# Patient Record
Sex: Male | Born: 1970 | Race: Black or African American | Hispanic: No | Marital: Single | Smoking: Never smoker
Health system: Southern US, Community
[De-identification: ages and names within clinical notes are randomized; demographics above are authoritative.]

## PROBLEM LIST (undated history)

## (undated) DIAGNOSIS — R32 Unspecified urinary incontinence: Secondary | ICD-10-CM

## (undated) DIAGNOSIS — F3181 Bipolar II disorder: Secondary | ICD-10-CM

## (undated) DIAGNOSIS — Z87442 Personal history of urinary calculi: Secondary | ICD-10-CM

## (undated) DIAGNOSIS — K921 Melena: Secondary | ICD-10-CM

## (undated) DIAGNOSIS — E785 Hyperlipidemia, unspecified: Secondary | ICD-10-CM

## (undated) DIAGNOSIS — I10 Essential (primary) hypertension: Secondary | ICD-10-CM

## (undated) DIAGNOSIS — D649 Anemia, unspecified: Secondary | ICD-10-CM

## (undated) DIAGNOSIS — F32A Depression, unspecified: Secondary | ICD-10-CM

## (undated) DIAGNOSIS — F419 Anxiety disorder, unspecified: Secondary | ICD-10-CM

## (undated) DIAGNOSIS — N319 Neuromuscular dysfunction of bladder, unspecified: Secondary | ICD-10-CM

## (undated) DIAGNOSIS — Z87828 Personal history of other (healed) physical injury and trauma: Secondary | ICD-10-CM

## (undated) DIAGNOSIS — G839 Paralytic syndrome, unspecified: Secondary | ICD-10-CM

## (undated) DIAGNOSIS — N189 Chronic kidney disease, unspecified: Secondary | ICD-10-CM

## (undated) DIAGNOSIS — F329 Major depressive disorder, single episode, unspecified: Secondary | ICD-10-CM

## (undated) HISTORY — DX: Anxiety disorder, unspecified: F41.9

## (undated) HISTORY — PX: WISDOM TOOTH EXTRACTION: SHX21

## (undated) HISTORY — DX: Melena: K92.1

## (undated) HISTORY — DX: Essential (primary) hypertension: I10

## (undated) HISTORY — DX: Personal history of other (healed) physical injury and trauma: Z87.828

## (undated) HISTORY — DX: Bipolar II disorder: F31.81

## (undated) HISTORY — DX: Major depressive disorder, single episode, unspecified: F32.9

## (undated) HISTORY — DX: Unspecified urinary incontinence: R32

## (undated) HISTORY — DX: Depression, unspecified: F32.A

## (undated) HISTORY — DX: Hyperlipidemia, unspecified: E78.5

## (undated) HISTORY — PX: OTHER SURGICAL HISTORY: SHX169

---

## 2001-12-19 HISTORY — PX: ABDOMINAL HERNIA REPAIR: SHX539

## 2006-09-16 ENCOUNTER — Inpatient Hospital Stay (HOSPITAL_COMMUNITY): Admission: EM | Admit: 2006-09-16 | Discharge: 2006-09-19 | Payer: Self-pay | Admitting: Emergency Medicine

## 2007-06-08 ENCOUNTER — Emergency Department (HOSPITAL_COMMUNITY): Admission: EM | Admit: 2007-06-08 | Discharge: 2007-06-08 | Payer: Self-pay | Admitting: Emergency Medicine

## 2010-10-08 ENCOUNTER — Inpatient Hospital Stay (HOSPITAL_COMMUNITY): Admission: EM | Admit: 2010-10-08 | Discharge: 2010-10-13 | Payer: Self-pay | Admitting: Emergency Medicine

## 2010-11-10 ENCOUNTER — Ambulatory Visit: Payer: Self-pay | Admitting: Psychiatry

## 2011-01-01 ENCOUNTER — Emergency Department (HOSPITAL_COMMUNITY)
Admission: EM | Admit: 2011-01-01 | Discharge: 2011-01-01 | Payer: Self-pay | Source: Home / Self Care | Admitting: Emergency Medicine

## 2011-01-03 LAB — POCT I-STAT, CHEM 8
Chloride: 104 mEq/L (ref 96–112)
Creatinine, Ser: 1.1 mg/dL (ref 0.4–1.5)
Glucose, Bld: 103 mg/dL — ABNORMAL HIGH (ref 70–99)
HCT: 43 % (ref 39.0–52.0)
Potassium: 3.8 mEq/L (ref 3.5–5.1)
Sodium: 140 mEq/L (ref 135–145)
TCO2: 29 mmol/L (ref 0–100)

## 2011-01-03 LAB — URINALYSIS, ROUTINE W REFLEX MICROSCOPIC
Bilirubin Urine: NEGATIVE
Ketones, ur: NEGATIVE mg/dL
Nitrite: NEGATIVE
Protein, ur: NEGATIVE mg/dL
Specific Gravity, Urine: 1.017 (ref 1.005–1.030)
Urine Glucose, Fasting: NEGATIVE mg/dL
Urobilinogen, UA: 0.2 mg/dL (ref 0.0–1.0)
pH: 6 (ref 5.0–8.0)

## 2011-01-03 LAB — URINE MICROSCOPIC-ADD ON

## 2011-01-04 ENCOUNTER — Emergency Department (HOSPITAL_COMMUNITY)
Admission: EM | Admit: 2011-01-04 | Discharge: 2011-01-05 | Disposition: A | Discharge status: Other Acute Inpatient Hospital | Payer: Self-pay | Source: Home / Self Care | Admitting: Emergency Medicine

## 2011-01-05 ENCOUNTER — Inpatient Hospital Stay (HOSPITAL_COMMUNITY)
Admission: AD | Admit: 2011-01-05 | Discharge: 2011-01-11 | Payer: Self-pay | Source: Home / Self Care | Attending: Psychiatry | Admitting: Psychiatry

## 2011-01-05 LAB — POCT I-STAT, CHEM 8
BUN: 21 mg/dL (ref 6–23)
Calcium, Ion: 1.24 mmol/L (ref 1.12–1.32)
Chloride: 104 mEq/L (ref 96–112)
Creatinine, Ser: 1.2 mg/dL (ref 0.4–1.5)
Glucose, Bld: 87 mg/dL (ref 70–99)
HCT: 38 % — ABNORMAL LOW (ref 39.0–52.0)
Hemoglobin: 12.9 g/dL — ABNORMAL LOW (ref 13.0–17.0)
Potassium: 4.3 mEq/L (ref 3.5–5.1)
Sodium: 139 mEq/L (ref 135–145)
TCO2: 28 mmol/L (ref 0–100)

## 2011-01-05 LAB — URINE CULTURE
Colony Count: >=100000
Culture  Setup Time: 201201141123

## 2011-01-05 LAB — RAPID URINE DRUG SCREEN, HOSP PERFORMED
Amphetamines: NOT DETECTED
Barbiturates: NOT DETECTED
Benzodiazepines: NOT DETECTED
Cocaine: NOT DETECTED
Opiates: NOT DETECTED
Tetrahydrocannabinol: NOT DETECTED

## 2011-01-05 LAB — ETHANOL: Alcohol, Ethyl (B): <5 mg/dL (ref 0–10)
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
## 2011-01-13 NOTE — Discharge Summary (Addendum)
  NAME:  SAXON, BARICH NO.:  [id number]  MEDICAL RECORD NO.:  [id number]          PATIENT TYPE:  IPS  LOCATION:  0500                          [facility]ILITY[facility]  [facility]  PHYSICIAN:  Eulogio Ditch, MD DATE OF BIRTH:  09-26-1971  DATE OF ADMISSION:  01/05/2011 DATE OF DISCHARGE:  01/11/2011                              DISCHARGE SUMMARY   REASON FOR ADMISSION:  Feeling depressed and suicidal.  HISTORY OF PRESENT ILLNESS:  A 40 year old African American male who was admitted as he was feeling depressed because he ran out of the Abilify. He was not taking Abilify because of his insurance issues and that is why he started feeling depressed and suicidal.  PAST PSYCH HISTORY:  The patient has a history of schizoaffective disorder.  He has a history of trying to kill himself twice in the past.  SUBSTANCE ABUSE:  The patient has no history of drug abuse of alcohol abuse.  PAST MEDICAL HISTORY:  History of hypertension, incontinence, hypercholesteremia.  ALLERGIES:  NO KNOWN DRUG ALLERGIES.  PHYSICAL EXAMINATION:  Within normal limits.  LABS:  Within normal limits.  SOCIAL HISTORY:  The patient lives in a retirement home.  He is single. He is on disability.  DISCHARGE DIAGNOSES:  Schizoaffective disorder, depressed type.  HOSPITAL COURSE:  The patient was started on Abilify 5 mg along with Zoloft 100 kg p.o. daily.  The patient responded to the medication well without any side effects.  During the hospital stay, the patient's behavior remained under control.  He participated in all the groups, always remained pleasant on approach.  On January 11, 2011 the patient told me that he is not feeling suicidal and he is feeling better on Abilify and he wants to be discharged.  I told him that we will give him a supply for 2 weeks at the time of discharge and then he can work on getting medication in the outpatient setting.  The patient agreed with treatment  plan.  DISCHARGE MEDICATIONS: 1. Abilify 5 mg at bedtime. 2. Zoloft 100 mg p.o. daily.  The patient advised to continue his regular medications and follow up with primary care physician.  FOLLOWUP APPOINTMENTS:  The patient will follow up with Dr. Milagros Evener [phone] appointment February 11, 2011 at 1:00 p.m. also with therapist, [facility] appointment January 13, 2011 at 2:00 p.m.     Eulogio Ditch, MD     SA/MEDQ  D:  01/11/2011  T:  01/11/2011  Job:  [id number]  Electronically Signed by Eulogio Ditch  on 01/13/2011 05:32:46 AM

## 2011-03-02 LAB — DIFFERENTIAL
Basophils Absolute: 0 10*3/uL (ref 0.0–0.1)
Basophils Relative: 0 % (ref 0–1)
Basophils Relative: 1 % (ref 0–1)
Eosinophils Absolute: 0.2 10*3/uL (ref 0.0–0.7)
Eosinophils Absolute: 0.2 10*3/uL (ref 0.0–0.7)
Eosinophils Relative: 1 % (ref 0–5)
Eosinophils Relative: 3 % (ref 0–5)
Lymphocytes Relative: 16 % (ref 12–46)
Lymphocytes Relative: 21 % (ref 12–46)
Lymphocytes Relative: 6 % — ABNORMAL LOW (ref 12–46)
Lymphs Abs: 1 10*3/uL (ref 0.7–4.0)
Lymphs Abs: 1.2 10*3/uL (ref 0.7–4.0)
Monocytes Absolute: 1 10*3/uL (ref 0.1–1.0)
Monocytes Relative: 6 % (ref 3–12)
Neutro Abs: 14 10*3/uL — ABNORMAL HIGH (ref 1.7–7.7)
Neutro Abs: 4.6 10*3/uL (ref 1.7–7.7)
Neutro Abs: 5.5 10*3/uL (ref 1.7–7.7)
Neutrophils Relative %: 73 % (ref 43–77)
Neutrophils Relative %: 87 % — ABNORMAL HIGH (ref 43–77)

## 2011-03-02 LAB — URINE MICROSCOPIC-ADD ON

## 2011-03-02 LAB — URINALYSIS, ROUTINE W REFLEX MICROSCOPIC
Bilirubin Urine: NEGATIVE
Glucose, UA: NEGATIVE mg/dL
Ketones, ur: 15 mg/dL — AB
Nitrite: POSITIVE — AB
Protein, ur: 100 mg/dL — AB
Specific Gravity, Urine: 1.017 (ref 1.005–1.030)
Urobilinogen, UA: 1 mg/dL (ref 0.0–1.0)
pH: 5.5 (ref 5.0–8.0)

## 2011-03-02 LAB — LIPID PANEL
Cholesterol: 165 mg/dL (ref 0–200)
HDL: 33 mg/dL — ABNORMAL LOW (ref >39)
LDL Cholesterol: 105 mg/dL — ABNORMAL HIGH (ref 0–99)
Triglycerides: 137 mg/dL (ref <150)

## 2011-03-02 LAB — CBC
HCT: 32.4 % — ABNORMAL LOW (ref 39.0–52.0)
HCT: 34.1 % — ABNORMAL LOW (ref 39.0–52.0)
HCT: 43 % (ref 39.0–52.0)
Hemoglobin: 14 g/dL (ref 13.0–17.0)
MCH: 21.5 pg — ABNORMAL LOW (ref 26.0–34.0)
MCH: 21.5 pg — ABNORMAL LOW (ref 26.0–34.0)
MCHC: 32.4 g/dL (ref 30.0–36.0)
MCHC: 32.5 g/dL (ref 30.0–36.0)
MCV: 66.2 fL — ABNORMAL LOW (ref 78.0–100.0)
MCV: 66.3 fL — ABNORMAL LOW (ref 78.0–100.0)
Platelets: 202 10*3/uL (ref 150–400)
Platelets: 253 10*3/uL (ref 150–400)
RBC: 4.89 MIL/uL (ref 4.22–5.81)
RBC: 6.49 MIL/uL — ABNORMAL HIGH (ref 4.22–5.81)
RDW: 16.9 % — ABNORMAL HIGH (ref 11.5–15.5)
RDW: 17.2 % — ABNORMAL HIGH (ref 11.5–15.5)
RDW: 17.3 % — ABNORMAL HIGH (ref 11.5–15.5)
WBC: 16.2 10*3/uL — ABNORMAL HIGH (ref 4.0–10.5)

## 2011-03-02 LAB — COMPREHENSIVE METABOLIC PANEL
ALT: 32 U/L (ref 0–53)
AST: 27 U/L (ref 0–37)
AST: 44 U/L — ABNORMAL HIGH (ref 0–37)
Albumin: 4.4 g/dL (ref 3.5–5.2)
Alkaline Phosphatase: 50 U/L (ref 39–117)
BUN: 15 mg/dL (ref 6–23)
CO2: 21 mEq/L (ref 19–32)
CO2: 25 mEq/L (ref 19–32)
Calcium: 8.6 mg/dL (ref 8.4–10.5)
Calcium: 9.6 mg/dL (ref 8.4–10.5)
Chloride: 106 mEq/L (ref 96–112)
Creatinine, Ser: 0.56 mg/dL (ref 0.4–1.5)
Creatinine, Ser: 1.29 mg/dL (ref 0.4–1.5)
GFR calc Af Amer: >60 mL/min (ref >60)
GFR calc Af Amer: >60 mL/min (ref >60)
GFR calc non Af Amer: >60 mL/min (ref >60)
GFR calc non Af Amer: >60 mL/min (ref >60)
Glucose, Bld: 115 mg/dL — ABNORMAL HIGH (ref 70–99)
Glucose, Bld: 120 mg/dL — ABNORMAL HIGH (ref 70–99)
Potassium: 2.9 mEq/L — ABNORMAL LOW (ref 3.5–5.1)
Sodium: 142 mEq/L (ref 135–145)
Total Bilirubin: 1 mg/dL (ref 0.3–1.2)
Total Protein: 7.9 g/dL (ref 6.0–8.3)

## 2011-03-02 LAB — RAPID URINE DRUG SCREEN, HOSP PERFORMED
Amphetamines: NOT DETECTED
Barbiturates: NOT DETECTED
Benzodiazepines: NOT DETECTED
Cocaine: NOT DETECTED
Opiates: NOT DETECTED
Tetrahydrocannabinol: NOT DETECTED

## 2011-03-02 LAB — BASIC METABOLIC PANEL
BUN: 3 mg/dL — ABNORMAL LOW (ref 6–23)
CO2: 25 mEq/L (ref 19–32)
Chloride: 114 mEq/L — ABNORMAL HIGH (ref 96–112)
Creatinine, Ser: 0.6 mg/dL (ref 0.4–1.5)

## 2011-03-02 LAB — MAGNESIUM: Magnesium: 2.2 mg/dL (ref 1.5–2.5)

## 2011-03-02 LAB — TROPONIN I: Troponin I: 0.07 ng/mL — ABNORMAL HIGH (ref 0.00–0.06)

## 2011-03-02 LAB — SALICYLATE LEVEL: Salicylate Lvl: <4 mg/dL (ref 2.8–20.0)

## 2011-03-02 LAB — URINE CULTURE
Colony Count: 85000
Culture  Setup Time: 201110220316

## 2011-03-02 LAB — CARDIAC PANEL(CRET KIN+CKTOT+MB+TROPI)
Relative Index: 0.4 (ref 0.0–2.5)
Relative Index: 0.4 (ref 0.0–2.5)
Troponin I: 0.04 ng/mL (ref 0.00–0.06)
Troponin I: 0.07 ng/mL — ABNORMAL HIGH (ref 0.00–0.06)

## 2011-03-02 LAB — ACETAMINOPHEN LEVEL: Acetaminophen (Tylenol), Serum: <10 ug/mL — ABNORMAL LOW (ref 10–30)

## 2011-03-02 LAB — AMMONIA: Ammonia: 42 umol/L — ABNORMAL HIGH (ref 11–35)

## 2011-03-02 LAB — ETHANOL: Alcohol, Ethyl (B): <5 mg/dL (ref 0–10)

## 2011-03-02 LAB — TSH: TSH: 1.765 u[IU]/mL (ref 0.350–4.500)

## 2011-03-02 LAB — CK TOTAL AND CKMB (NOT AT ARMC)
CK, MB: 7.1 ng/mL (ref 0.3–4.0)
Relative Index: 0.4 (ref 0.0–2.5)
Total CK: 1826 U/L — ABNORMAL HIGH (ref 7–232)
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
## 2011-05-06 NOTE — Discharge Summary (Signed)
NAME:  Andre Clements, Andre Clements                 ACCOUNT NO.:  [id number]   MEDICAL RECORD NO.:  [id number]          PATIENT TYPE:  INP   LOCATION:  1611                         FACILITY:  [facility]   PHYSICIAN:  Melissa L. Ladona Ridgel, MD  DATE OF BIRTH:  [birth year]   DATE OF ADMISSION:  09/16/2006  DATE OF DISCHARGE:                                 DISCHARGE SUMMARY   CHIEF COMPLAINT ON ADMISSION:  Homelessness, paraplegia, and constipation.   DISCHARGING DIAGNOSES:  1. Constipation.  The patient has been placed on MiraLax but will be      provided with prescriptions for Colace and Dulcolax to assist with his      bowel regimen.  2. Homelessness.  The patient was evidently released from a mental      hospital facility in [place] prior to arriving in [place].  The      patient's care worker had made arrangements for him to live with his      uncle, but, unfortunately, uncle is incapable of assisting the patient      and does not have a home that is handicapped accessible.  The patient,      therefore, is effectively homeless and needs assistance with his daily      events.  Care Management will attempt to find a placement for him.  3. Gastroesophageal reflux disease.  He will remain on his proton-pump      inhibitor.  4. Hypertension.  He will remain on his lisinopril.  5. Depression.  He will remain on his Abilify.   HOSPITAL COURSE:  The patient was seen and evaluated and admitted to the  general medical floor to await social work care management placement.  Baseline laboratories revealed no abnormalities.  His urine is pending but,  at this time, does not appear to have an urinary tract infection.  The  patient is feeling upbeat and well.  He has acquired his belongings from his  uncle and awaits placement for further care as an outpatient.  At this time,  I see no psychiatric need for this patient.  He has been appropriate and is  not at risk for any behavioral issues.   On the day of  discharge, the patient's vital signs have been stable.  He is  afebrile.  Temperature 97.4.  Blood pressure 132/88.  Pulse is 60.  Respirations 20.  Saturations 99%.  GENERAL:  This is a moderately obese, African-American male, confined to a  wheelchair secondary to paraplegia.  He is in no acute distress.  He is normocephalic, atraumatic.  Pupils equal round and reactive to light.  Extraocular muscles are intact.  Mucous membranes are moist.  NECK:  Supple.  There is no JVD.  No lymph nodes.  No carotid bruits.  CHEST:  Clear to auscultation.  There are no rhonchi, rubs, or wheezes.  CARDIOVASCULAR:  Regular rate rhythm.  Positive S1, S2.  No S3, S4.  No  murmurs, rubs, or gallops.  ABDOMEN:  Soft, nontender, nondistended with positive bowel sounds.  EXTREMITIES:  Shows muscle wasting but no edema.  Neurologically, the patient is a paraplegic.  He has some mobility to lift  from the hip but, otherwise, cannot stand.  His cranial nerves II through  XII are intact.  He did not express any suicidal or homicidal ideations.   Discharging laboratory values reveal a white count of 9.0, hemoglobin 12.2,  hematocrit 30.0, platelets of 291.  His sodium is 142, potassium 3.8,  chloride 106,  CO2 is 29.  BUN is 12,  creatinine 0.8. Glucose is 108.   MEDICATIONS:  At the time of discharge will be:  1. Prilosec 20 mg daily.  2. Lisinopril 10 mg daily.  3. Abilify 30 mg daily.  4. Multivitamin.  5. Prescriptions for Colace 100 mg p.o. b.i.d. and MiraLax 17 g in 8      ounces daily as well as Dulcolax as needed.      Melissa L. Ladona Ridgel, MD  Electronically Signed     MLT/MEDQ  D:  09/18/2006  T:  09/18/2006  Job:  [id number]

## 2011-07-22 ENCOUNTER — Emergency Department (HOSPITAL_COMMUNITY)
Admission: EM | Admit: 2011-07-22 | Discharge: 2011-07-22 | Disposition: A | Discharge status: Home / Self Care | Payer: Medicare Other | Attending: Emergency Medicine | Admitting: Emergency Medicine

## 2011-07-22 DIAGNOSIS — I10 Essential (primary) hypertension: Secondary | ICD-10-CM | POA: Insufficient documentation

## 2011-07-22 DIAGNOSIS — Y9289 Other specified places as the place of occurrence of the external cause: Secondary | ICD-10-CM | POA: Insufficient documentation

## 2011-07-22 DIAGNOSIS — W268XXA Contact with other sharp object(s), not elsewhere classified, initial encounter: Secondary | ICD-10-CM | POA: Insufficient documentation

## 2011-07-22 DIAGNOSIS — E7800 Pure hypercholesterolemia, unspecified: Secondary | ICD-10-CM | POA: Insufficient documentation

## 2011-07-22 DIAGNOSIS — G8220 Paraplegia, unspecified: Secondary | ICD-10-CM | POA: Insufficient documentation

## 2011-07-22 DIAGNOSIS — K219 Gastro-esophageal reflux disease without esophagitis: Secondary | ICD-10-CM | POA: Insufficient documentation

## 2011-07-22 DIAGNOSIS — S61209A Unspecified open wound of unspecified finger without damage to nail, initial encounter: Secondary | ICD-10-CM | POA: Insufficient documentation

## 2011-11-17 IMAGING — CT CT HEAD W/O CM
1 series · 16 of 30 positions shown, 20 images · non-contrast
Comparison: None.

CLINICAL DATA: Altered level of consciousness.  Confusion.

CT HEAD WITHOUT CONTRAST
TECHNIQUE: Contiguous axial images were obtained from the base of
the skull through the vertex without contrast.

[Series 2: head_seq 4.5 h37s st · axial · 0.43mm/px · z∈[+1251,+1377]mm · 16 of 32 slices shown, 20 images]
[im 2/32  brain]
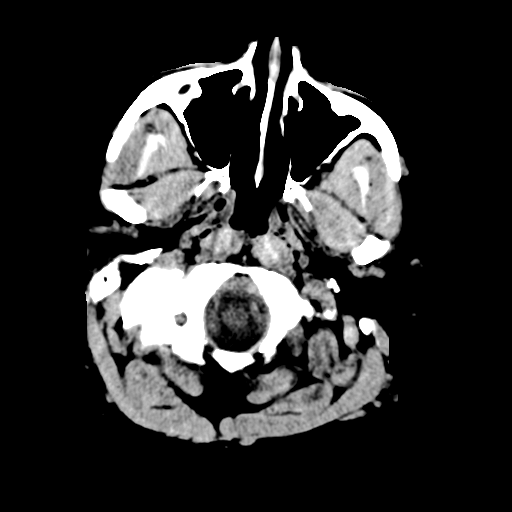
[im 2/32  bone]
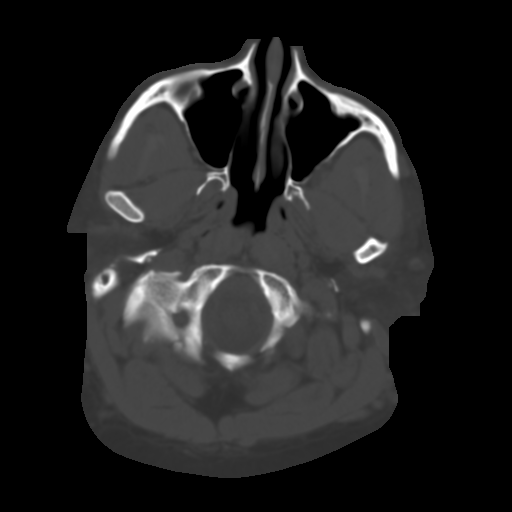
[im 4/32  brain]
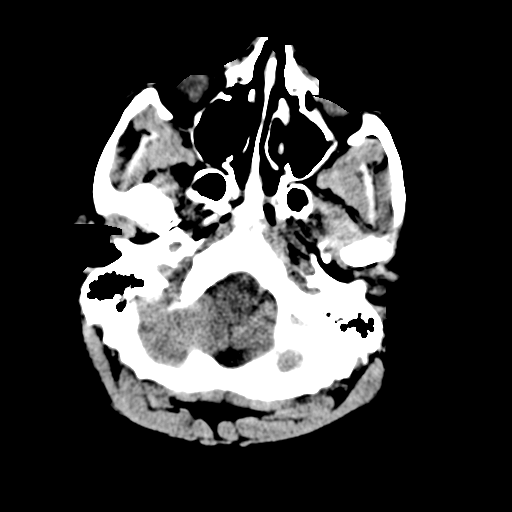
[im 6/32  brain]
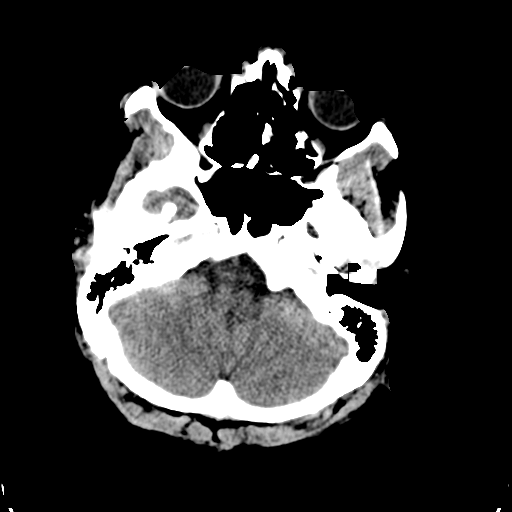
[im 8/32  brain]
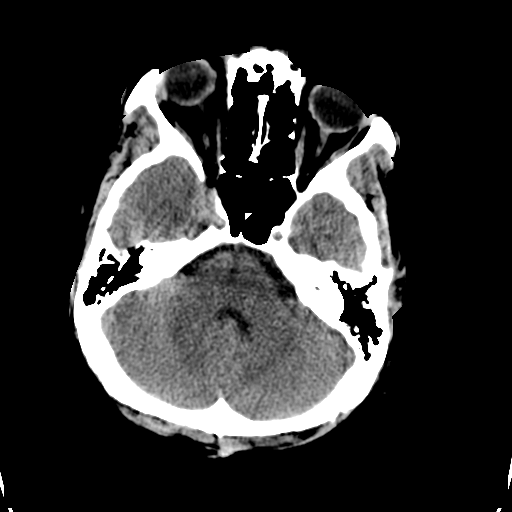
[im 9/32  brain]
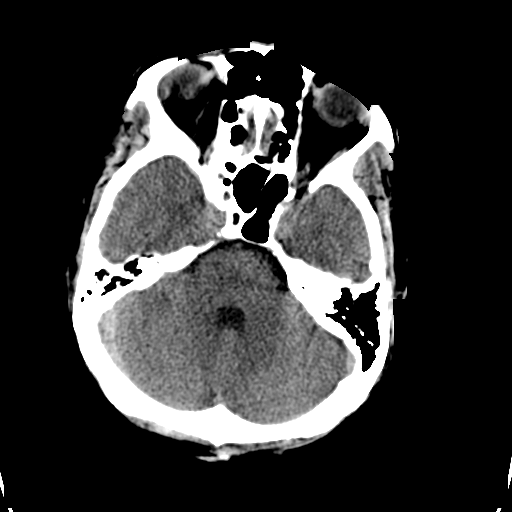
[im 9/32  bone]
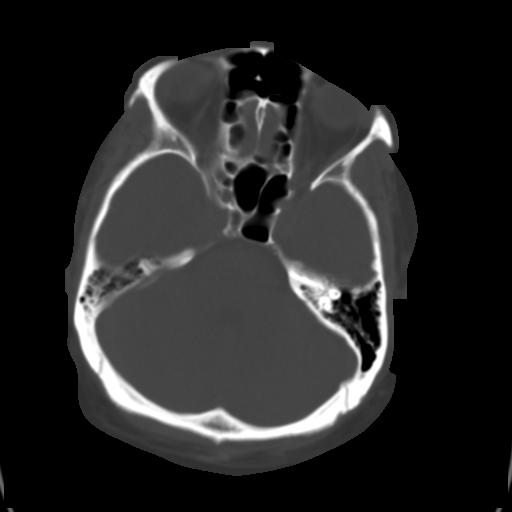
[im 11/32  brain]
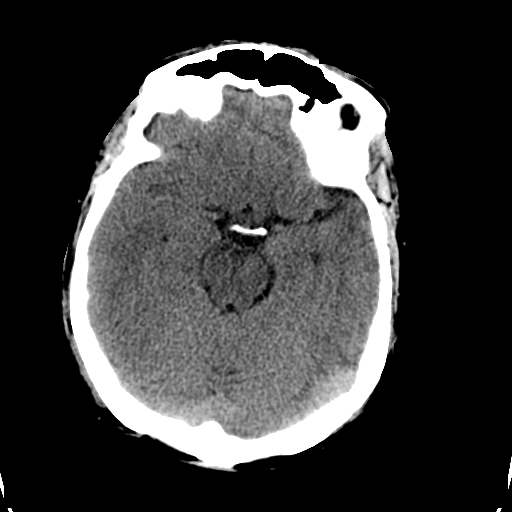
[im 13/32  brain]
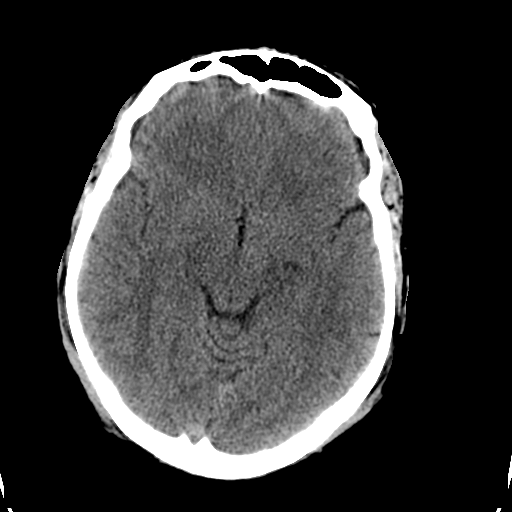
[im 15/32  brain]
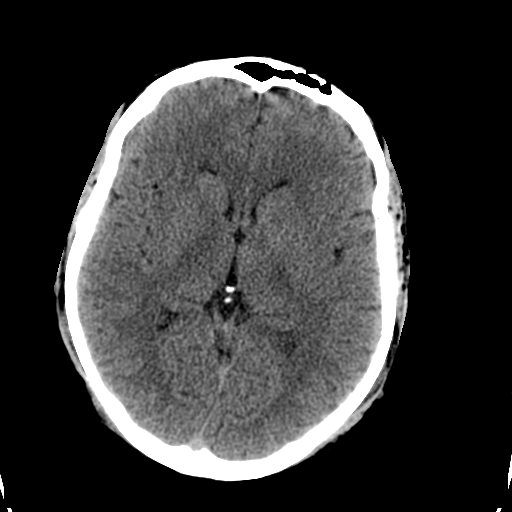
[im 17/32  brain]
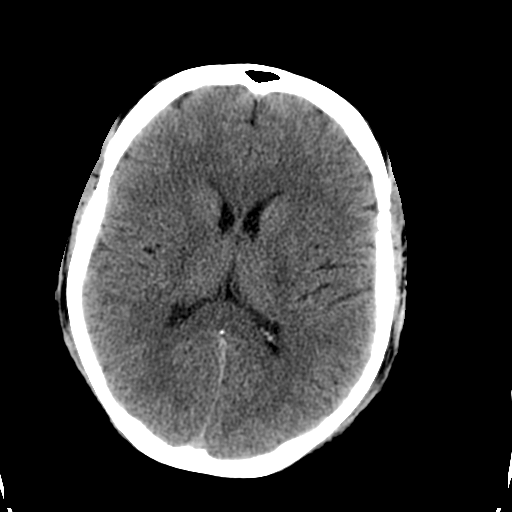
[im 17/32  bone]
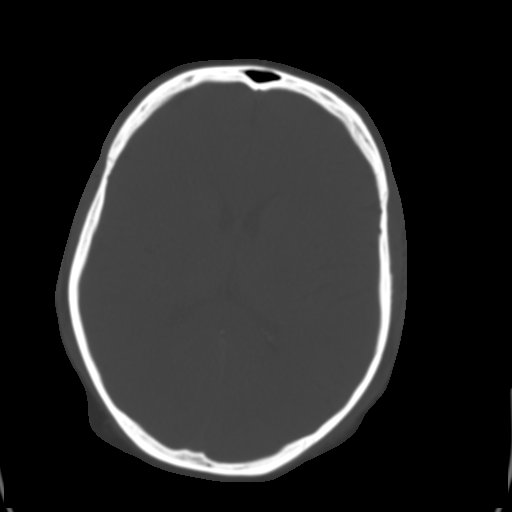
[im 19/32  brain]
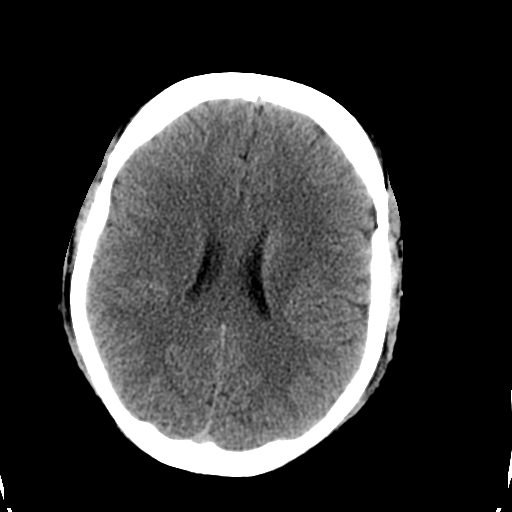
[im 21/32  brain]
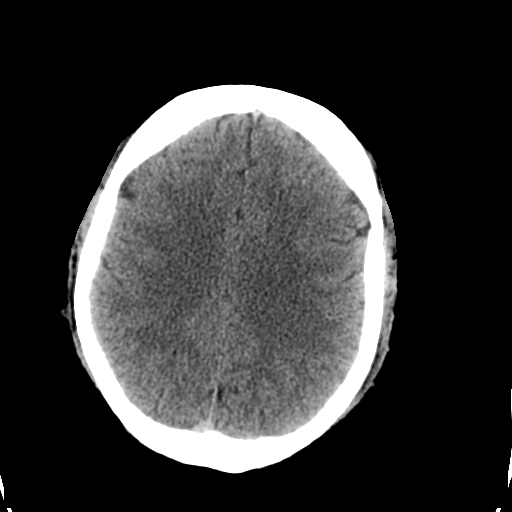
[im 23/32  brain]
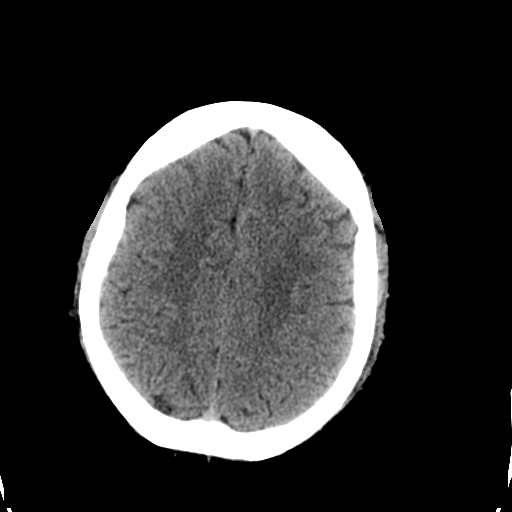
[im 24/32  brain]
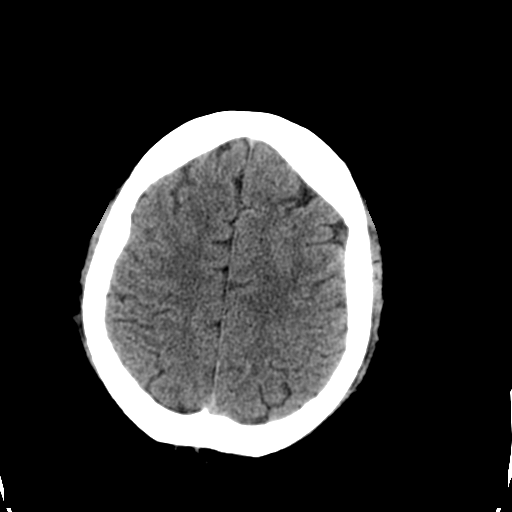
[im 24/32  bone]
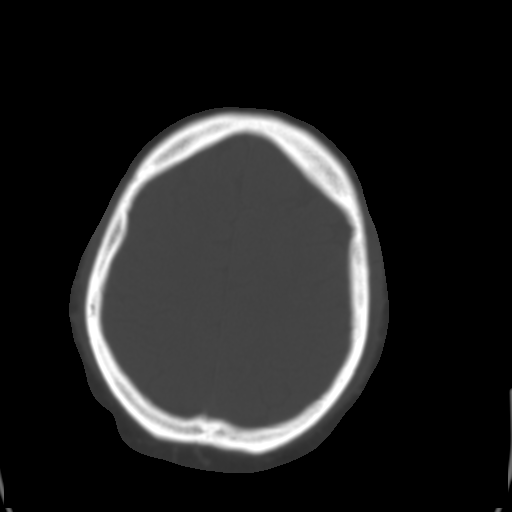
[im 26/32  brain]
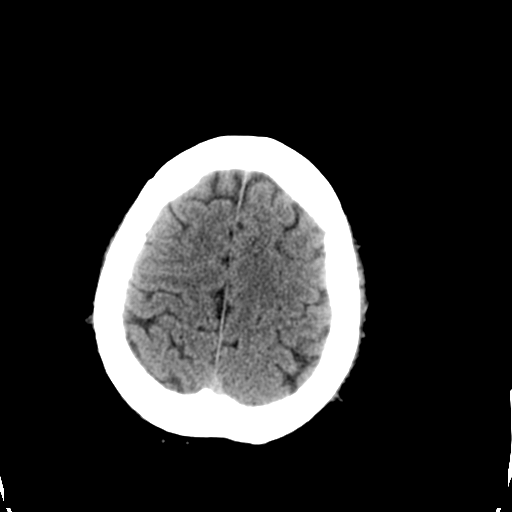
[im 28/32  brain]
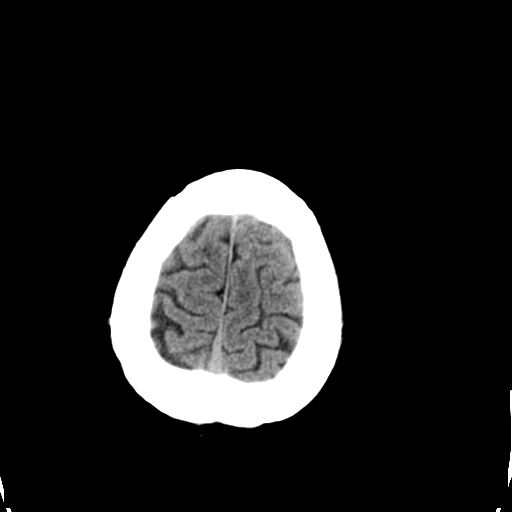
[im 30/32  brain]
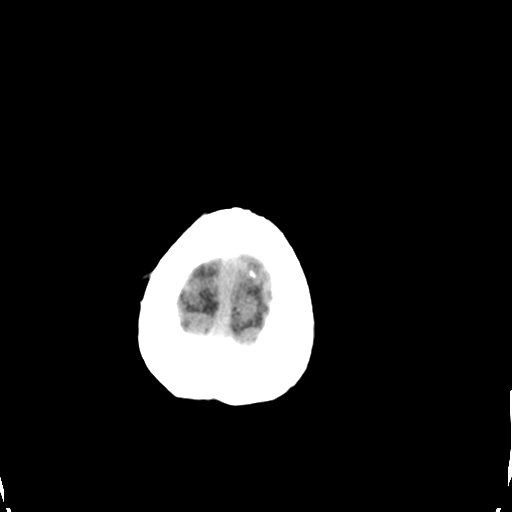

[16 of 30 positions shown; findings below may reference images not displayed]

FINDINGS: There is no acute infarction, hemorrhage, or intracranial
mass lesion.  The brain parenchyma is normal.  Osseous structures
are normal.
IMPRESSION: Normal CT scan of the head without contrast.

## 2013-05-28 ENCOUNTER — Ambulatory Visit: Payer: Medicare Other | Admitting: Physical Therapy

## 2013-06-27 ENCOUNTER — Ambulatory Visit: Payer: Medicare Other | Attending: Internal Medicine | Admitting: Physical Therapy

## 2013-06-27 DIAGNOSIS — IMO0001 Reserved for inherently not codable concepts without codable children: Secondary | ICD-10-CM | POA: Insufficient documentation

## 2013-06-27 DIAGNOSIS — M6281 Muscle weakness (generalized): Secondary | ICD-10-CM | POA: Insufficient documentation

## 2013-06-27 DIAGNOSIS — G8220 Paraplegia, unspecified: Secondary | ICD-10-CM | POA: Insufficient documentation

## 2014-12-24 DIAGNOSIS — I10 Essential (primary) hypertension: Secondary | ICD-10-CM | POA: Diagnosis not present

## 2014-12-24 DIAGNOSIS — F3130 Bipolar disorder, current episode depressed, mild or moderate severity, unspecified: Secondary | ICD-10-CM | POA: Diagnosis not present

## 2014-12-24 DIAGNOSIS — Z79899 Other long term (current) drug therapy: Secondary | ICD-10-CM | POA: Diagnosis not present

## 2014-12-24 DIAGNOSIS — E261 Secondary hyperaldosteronism: Secondary | ICD-10-CM | POA: Diagnosis not present

## 2014-12-24 DIAGNOSIS — E291 Testicular hypofunction: Secondary | ICD-10-CM | POA: Diagnosis not present

## 2014-12-24 DIAGNOSIS — D509 Iron deficiency anemia, unspecified: Secondary | ICD-10-CM | POA: Diagnosis not present

## 2014-12-31 DIAGNOSIS — R32 Unspecified urinary incontinence: Secondary | ICD-10-CM | POA: Diagnosis not present

## 2015-01-01 DIAGNOSIS — R32 Unspecified urinary incontinence: Secondary | ICD-10-CM | POA: Diagnosis not present

## 2015-01-02 DIAGNOSIS — R32 Unspecified urinary incontinence: Secondary | ICD-10-CM | POA: Diagnosis not present

## 2015-01-26 DIAGNOSIS — R32 Unspecified urinary incontinence: Secondary | ICD-10-CM | POA: Diagnosis not present

## 2015-03-05 DIAGNOSIS — R32 Unspecified urinary incontinence: Secondary | ICD-10-CM | POA: Diagnosis not present

## 2015-03-20 DIAGNOSIS — R32 Unspecified urinary incontinence: Secondary | ICD-10-CM | POA: Diagnosis not present

## 2015-03-25 DIAGNOSIS — D509 Iron deficiency anemia, unspecified: Secondary | ICD-10-CM | POA: Diagnosis not present

## 2015-03-25 DIAGNOSIS — F3130 Bipolar disorder, current episode depressed, mild or moderate severity, unspecified: Secondary | ICD-10-CM | POA: Diagnosis not present

## 2015-03-25 DIAGNOSIS — E782 Mixed hyperlipidemia: Secondary | ICD-10-CM | POA: Diagnosis not present

## 2015-03-25 DIAGNOSIS — I10 Essential (primary) hypertension: Secondary | ICD-10-CM | POA: Diagnosis not present

## 2015-03-25 DIAGNOSIS — E291 Testicular hypofunction: Secondary | ICD-10-CM | POA: Diagnosis not present

## 2015-03-25 DIAGNOSIS — Z79899 Other long term (current) drug therapy: Secondary | ICD-10-CM | POA: Diagnosis not present

## 2015-04-17 DIAGNOSIS — R32 Unspecified urinary incontinence: Secondary | ICD-10-CM | POA: Diagnosis not present

## 2015-04-21 DIAGNOSIS — R32 Unspecified urinary incontinence: Secondary | ICD-10-CM | POA: Diagnosis not present

## 2015-04-22 DIAGNOSIS — R32 Unspecified urinary incontinence: Secondary | ICD-10-CM | POA: Diagnosis not present

## 2015-05-13 DIAGNOSIS — R32 Unspecified urinary incontinence: Secondary | ICD-10-CM | POA: Diagnosis not present

## 2015-05-29 DIAGNOSIS — R32 Unspecified urinary incontinence: Secondary | ICD-10-CM | POA: Diagnosis not present

## 2015-06-09 DIAGNOSIS — R32 Unspecified urinary incontinence: Secondary | ICD-10-CM | POA: Diagnosis not present

## 2015-06-25 DIAGNOSIS — R32 Unspecified urinary incontinence: Secondary | ICD-10-CM | POA: Diagnosis not present

## 2015-07-10 DIAGNOSIS — R32 Unspecified urinary incontinence: Secondary | ICD-10-CM | POA: Diagnosis not present

## 2015-07-27 DIAGNOSIS — R32 Unspecified urinary incontinence: Secondary | ICD-10-CM | POA: Diagnosis not present

## 2015-08-18 DIAGNOSIS — R32 Unspecified urinary incontinence: Secondary | ICD-10-CM | POA: Diagnosis not present

## 2015-08-20 ENCOUNTER — Ambulatory Visit: Payer: Self-pay | Admitting: Internal Medicine

## 2015-09-03 DIAGNOSIS — R32 Unspecified urinary incontinence: Secondary | ICD-10-CM | POA: Diagnosis not present

## 2015-09-14 ENCOUNTER — Other Ambulatory Visit (INDEPENDENT_AMBULATORY_CARE_PROVIDER_SITE_OTHER): Payer: Medicare Other

## 2015-09-14 ENCOUNTER — Ambulatory Visit (INDEPENDENT_AMBULATORY_CARE_PROVIDER_SITE_OTHER): Payer: Medicare Other | Admitting: Internal Medicine

## 2015-09-14 ENCOUNTER — Encounter: Payer: Self-pay | Admitting: Internal Medicine

## 2015-09-14 ENCOUNTER — Encounter (INDEPENDENT_AMBULATORY_CARE_PROVIDER_SITE_OTHER): Payer: Self-pay

## 2015-09-14 VITALS — BP 160/102 | HR 93 | Temp 98.6°F | Resp 12

## 2015-09-14 DIAGNOSIS — I10 Essential (primary) hypertension: Secondary | ICD-10-CM

## 2015-09-14 DIAGNOSIS — F39 Unspecified mood [affective] disorder: Secondary | ICD-10-CM

## 2015-09-14 DIAGNOSIS — R7301 Impaired fasting glucose: Secondary | ICD-10-CM | POA: Diagnosis not present

## 2015-09-14 DIAGNOSIS — E785 Hyperlipidemia, unspecified: Secondary | ICD-10-CM

## 2015-09-14 DIAGNOSIS — Z993 Dependence on wheelchair: Secondary | ICD-10-CM

## 2015-09-14 DIAGNOSIS — R7989 Other specified abnormal findings of blood chemistry: Secondary | ICD-10-CM

## 2015-09-14 DIAGNOSIS — Z23 Encounter for immunization: Secondary | ICD-10-CM | POA: Diagnosis not present

## 2015-09-14 DIAGNOSIS — E119 Type 2 diabetes mellitus without complications: Secondary | ICD-10-CM

## 2015-09-14 LAB — COMPREHENSIVE METABOLIC PANEL
ALK PHOS: 48 U/L (ref 39–117)
ALT: 30 U/L (ref 0–53)
AST: 20 U/L (ref 0–37)
Albumin: 4.2 g/dL (ref 3.5–5.2)
BILIRUBIN TOTAL: 0.3 mg/dL (ref 0.2–1.2)
BUN: 14 mg/dL (ref 6–23)
CALCIUM: 9.4 mg/dL (ref 8.4–10.5)
CO2: 28 mEq/L (ref 19–32)
Chloride: 103 mEq/L (ref 96–112)
Creatinine, Ser: 0.61 mg/dL (ref 0.40–1.50)
GFR: 186.35 mL/min (ref >60.00)
Glucose, Bld: 108 mg/dL — ABNORMAL HIGH (ref 70–99)
POTASSIUM: 3.7 meq/L (ref 3.5–5.1)
Sodium: 139 mEq/L (ref 135–145)
TOTAL PROTEIN: 7.3 g/dL (ref 6.0–8.3)

## 2015-09-14 LAB — LDL CHOLESTEROL, DIRECT: Direct LDL: 84 mg/dL

## 2015-09-14 LAB — LIPID PANEL
CHOLESTEROL: 159 mg/dL (ref 0–200)
HDL: 36.2 mg/dL — ABNORMAL LOW (ref >39.00)
NonHDL: 122.76
TRIGLYCERIDES: 332 mg/dL — AB (ref 0.0–149.0)
Total CHOL/HDL Ratio: 4
VLDL: 66.4 mg/dL — ABNORMAL HIGH (ref 0.0–40.0)

## 2015-09-14 LAB — HEMOGLOBIN A1C: HEMOGLOBIN A1C: 5.7 % (ref 4.6–6.5)

## 2015-09-14 NOTE — Patient Instructions (Signed)
We will check the blood work today and sent in the refills if the labs are okay. We may have to adjust your medicines based on the results.   We have given you the flu shot. We will work on the scooter and depending on insurance you may need to come in for another visit. We will check.   We have gotten the referral going for the psychiatrist in your network to handle your bipolar disorder.

## 2015-09-14 NOTE — Progress Notes (Signed)
Pre visit review using our clinic review tool, if applicable. No additional management support is needed unless otherwise documented below in the visit note. 

## 2015-09-16 ENCOUNTER — Encounter: Payer: Self-pay | Admitting: Internal Medicine

## 2015-09-16 DIAGNOSIS — E785 Hyperlipidemia, unspecified: Secondary | ICD-10-CM | POA: Insufficient documentation

## 2015-09-16 DIAGNOSIS — G8220 Paraplegia, unspecified: Secondary | ICD-10-CM | POA: Insufficient documentation

## 2015-09-16 DIAGNOSIS — E119 Type 2 diabetes mellitus without complications: Secondary | ICD-10-CM | POA: Insufficient documentation

## 2015-09-16 DIAGNOSIS — F39 Unspecified mood [affective] disorder: Secondary | ICD-10-CM | POA: Insufficient documentation

## 2015-09-16 DIAGNOSIS — R7301 Impaired fasting glucose: Secondary | ICD-10-CM | POA: Diagnosis not present

## 2015-09-16 DIAGNOSIS — I10 Essential (primary) hypertension: Secondary | ICD-10-CM | POA: Insufficient documentation

## 2015-09-16 DIAGNOSIS — Z23 Encounter for immunization: Secondary | ICD-10-CM

## 2015-09-16 NOTE — Assessment & Plan Note (Signed)
Not known if he has any complications. Getting his old records and checking HgA1c for level of control today. Is on ACE-I and statin. Adjust as needed.

## 2015-09-16 NOTE — Assessment & Plan Note (Signed)
Goal LDL <100 with his diabetes. Is taking zocor 40 mg daily. Adjust as needed, checking lipid panel today.

## 2015-09-16 NOTE — Assessment & Plan Note (Signed)
Unclear what exactly this is but he thinks that it might be bipolar disorder. Is currently taking haldol and zoloft. He states that he used to be on 3-4 medications for his mood and was able to come off some. Mood stable today and he just refilled his mental health medications so will not fill and will refer to psychiatry.

## 2015-09-16 NOTE — Assessment & Plan Note (Signed)
BP is moderately high at today's visit. Will likely adjust his lisinopril 10 mg if CMP okay.
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
## 2015-09-16 NOTE — Assessment & Plan Note (Signed)
Getting records for exact diagnosis. He knows that he had some spinal cord injury after a failed suicide attempt. He is hoping to get an electric scooter so he can independently get to the grocery store. Will try to order and see what other things are required and he may require additional visit for evaluation of those criteria.

## 2015-09-28 DIAGNOSIS — R32 Unspecified urinary incontinence: Secondary | ICD-10-CM | POA: Diagnosis not present

## 2015-10-01 ENCOUNTER — Telehealth: Payer: Self-pay | Admitting: Internal Medicine

## 2015-10-01 NOTE — Telephone Encounter (Signed)
Pt called in and wants to know what is going on with his referral to phycologist?  He is able to be out of meds and is worried about running out?

## 2015-10-02 NOTE — Telephone Encounter (Signed)
Referral faxed to [facility] They will contact pt directly.

## 2015-10-23 DIAGNOSIS — R32 Unspecified urinary incontinence: Secondary | ICD-10-CM | POA: Diagnosis not present

## 2015-10-27 ENCOUNTER — Encounter (HOSPITAL_COMMUNITY): Payer: Self-pay | Admitting: Psychiatry

## 2015-10-27 ENCOUNTER — Ambulatory Visit (INDEPENDENT_AMBULATORY_CARE_PROVIDER_SITE_OTHER): Payer: Medicare Other | Admitting: Psychiatry

## 2015-10-27 VITALS — BP 123/77 | HR 85 | Ht 68.0 in

## 2015-10-27 DIAGNOSIS — F251 Schizoaffective disorder, depressive type: Secondary | ICD-10-CM

## 2015-10-27 MED ORDER — SERTRALINE HCL 100 MG PO TABS: 200.0000 mg | ORAL_TABLET | Freq: Every day | ORAL | Status: DC
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
## 2015-11-02 ENCOUNTER — Telehealth: Payer: Self-pay | Admitting: Internal Medicine

## 2015-11-02 NOTE — Telephone Encounter (Signed)
Pt called in and needs refill on all meds on his med refilled but the Hadol.    Pharmacy is [facility]

## 2015-11-03 ENCOUNTER — Other Ambulatory Visit: Payer: Self-pay | Admitting: Geriatric Medicine

## 2015-11-03 DIAGNOSIS — F251 Schizoaffective disorder, depressive type: Secondary | ICD-10-CM

## 2015-11-03 MED ORDER — LISINOPRIL 10 MG PO TABS: 10.0000 mg | ORAL_TABLET | Freq: Every day | ORAL | Status: DC

## 2015-11-03 MED ORDER — METFORMIN HCL ER 500 MG PO TB24: 500.0000 mg | ORAL_TABLET | Freq: Every day | ORAL | Status: DC

## 2015-11-03 MED ORDER — SIMVASTATIN 40 MG PO TABS: 40.0000 mg | ORAL_TABLET | Freq: Every day | ORAL | Status: DC

## 2015-11-03 MED ORDER — SERTRALINE HCL 100 MG PO TABS: 200.0000 mg | ORAL_TABLET | Freq: Every day | ORAL | Status: DC

## 2015-11-03 MED ORDER — OMEPRAZOLE 40 MG PO CPDR: 40.0000 mg | DELAYED_RELEASE_CAPSULE | Freq: Every day | ORAL | Status: DC

## 2015-11-03 NOTE — Telephone Encounter (Signed)
Sent to pharmacy 

## 2015-11-04 ENCOUNTER — Ambulatory Visit (HOSPITAL_COMMUNITY): Payer: Medicare Other | Admitting: Psychiatry

## 2015-11-19 DIAGNOSIS — R32 Unspecified urinary incontinence: Secondary | ICD-10-CM | POA: Diagnosis not present

## 2015-12-04 ENCOUNTER — Ambulatory Visit (HOSPITAL_COMMUNITY): Payer: Self-pay | Admitting: Psychiatry

## 2015-12-15 ENCOUNTER — Encounter: Payer: Self-pay | Admitting: Internal Medicine

## 2015-12-15 ENCOUNTER — Ambulatory Visit (INDEPENDENT_AMBULATORY_CARE_PROVIDER_SITE_OTHER): Payer: Medicare Other | Admitting: Internal Medicine

## 2015-12-15 ENCOUNTER — Other Ambulatory Visit (INDEPENDENT_AMBULATORY_CARE_PROVIDER_SITE_OTHER): Payer: Medicare Other

## 2015-12-15 VITALS — BP 112/72 | HR 70 | Temp 98.7°F | Resp 12

## 2015-12-15 DIAGNOSIS — Z23 Encounter for immunization: Secondary | ICD-10-CM | POA: Diagnosis not present

## 2015-12-15 DIAGNOSIS — E119 Type 2 diabetes mellitus without complications: Secondary | ICD-10-CM

## 2015-12-15 DIAGNOSIS — I10 Essential (primary) hypertension: Secondary | ICD-10-CM | POA: Diagnosis not present

## 2015-12-15 LAB — HEMOGLOBIN A1C: Hgb A1c MFr Bld: 5.6 % (ref 4.6–6.5)

## 2015-12-15 NOTE — Addendum Note (Signed)
Addended by: Resa Miner R on: 12/15/2015 01:36 PM   Modules accepted: Orders

## 2015-12-15 NOTE — Assessment & Plan Note (Signed)
BP at goal today on the lisinopril. Prior CMP without reason for change. Not complicated.

## 2015-12-15 NOTE — Progress Notes (Signed)
Pre visit review using our clinic review tool, if applicable. No additional management support is needed unless otherwise documented below in the visit note. 

## 2015-12-15 NOTE — Assessment & Plan Note (Signed)
Not complicated, needs eye exam referral placed today. Taking metformin, simvastatin, and lisinopril. Checking HgA1c and adjust as needed.

## 2015-12-15 NOTE — Progress Notes (Signed)
   Subjective:    Patient ID: Andre Clements, male    DOB: [birth year], 44 y.o.   MRN: [id number]  HPI The patient is a 44 YO man coming in for follow up of his sugars. He is still taking the metformin. He is working on losing some weight but is not sure since he is in the wheelchair if he is successful. No changes to his diet. No new problems or symptoms.   Review of Systems  Constitutional: Positive for activity change. Negative for fever, appetite change, fatigue and unexpected weight change.  Respiratory: Negative for cough, chest tightness, shortness of breath and wheezing.   Cardiovascular: Negative for chest pain, palpitations and leg swelling.  Gastrointestinal: Negative for nausea, abdominal pain, diarrhea, constipation and abdominal distention.  Genitourinary:       Incontinent  Musculoskeletal: Positive for gait problem. Negative for myalgias, back pain and arthralgias.  Skin: Negative.   Neurological: Positive for weakness and numbness. Negative for dizziness, light-headedness and headaches.       Objective:   Physical Exam  Constitutional: He is oriented to person, place, and time. He appears well-developed and well-nourished.  In wheelchair  HENT:  Head: Normocephalic and atraumatic.  Eyes: EOM are normal.  Neck: Normal range of motion. No JVD present.  Cardiovascular: Normal rate and regular rhythm.   Pulmonary/Chest: Effort normal and breath sounds normal. No respiratory distress. He has no wheezes. He has no rales.  Abdominal: Soft. Bowel sounds are normal. He exhibits no distension. There is no tenderness. There is no rebound.  Neurological: He is alert and oriented to person, place, and time. Coordination abnormal.  Skin: Skin is warm and dry.  Foot exam done  Psychiatric: He has a normal mood and affect.   Filed Vitals:   12/15/15 1040  BP: 112/72  Pulse: 70  Temp: 98.7 F (37.1 C)  TempSrc: Oral  Resp: 12  SpO2: 96%      Assessment & Plan:  Pneumonia  23 given at visit.

## 2015-12-15 NOTE — Patient Instructions (Signed)
We will get you in with the eye doctor to check the eyes.   We will check the labs today to check on the sugars and the testosterone levels.   Come back in about 6 months

## 2015-12-16 LAB — TESTOSTERONE, FREE, TOTAL, SHBG
SEX HORMONE BINDING: 24 nmol/L (ref 10–50)
TESTOSTERONE FREE: 27.3 pg/mL — AB (ref 47.0–244.0)
Testosterone-% Free: 2.2 % (ref 1.6–2.9)
Testosterone: 124 ng/dL — ABNORMAL LOW (ref 300–890)

## 2015-12-24 DIAGNOSIS — R32 Unspecified urinary incontinence: Secondary | ICD-10-CM | POA: Diagnosis not present

## 2015-12-31 ENCOUNTER — Encounter (HOSPITAL_COMMUNITY): Payer: Self-pay | Admitting: Psychiatry

## 2015-12-31 ENCOUNTER — Ambulatory Visit (INDEPENDENT_AMBULATORY_CARE_PROVIDER_SITE_OTHER): Payer: Medicare Other | Admitting: Psychiatry

## 2015-12-31 VITALS — BP 141/86 | HR 80 | Ht 68.0 in | Wt 220.0 lb

## 2015-12-31 DIAGNOSIS — F251 Schizoaffective disorder, depressive type: Secondary | ICD-10-CM

## 2015-12-31 MED ORDER — HALOPERIDOL 5 MG PO TABS: 5.0000 mg | ORAL_TABLET | Freq: Every day | ORAL | Status: DC
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
## 2016-01-14 ENCOUNTER — Telehealth: Payer: Self-pay | Admitting: Internal Medicine

## 2016-01-14 NOTE — Telephone Encounter (Signed)
Would like call back about testosterone results and if he needs to take meds.

## 2016-01-21 DIAGNOSIS — R32 Unspecified urinary incontinence: Secondary | ICD-10-CM | POA: Diagnosis not present

## 2016-02-04 ENCOUNTER — Telehealth: Payer: Self-pay | Admitting: Internal Medicine

## 2016-02-04 DIAGNOSIS — R7989 Other specified abnormal findings of blood chemistry: Secondary | ICD-10-CM

## 2016-02-04 NOTE — Telephone Encounter (Signed)
Placed referral  

## 2016-02-04 NOTE — Telephone Encounter (Signed)
Pt called back,due to low testosterone level, he would like to get a referral for the endocrinologist. Please help

## 2016-02-18 DIAGNOSIS — R32 Unspecified urinary incontinence: Secondary | ICD-10-CM | POA: Diagnosis not present

## 2016-03-01 ENCOUNTER — Encounter: Payer: Self-pay | Admitting: Endocrinology

## 2016-03-01 ENCOUNTER — Ambulatory Visit (INDEPENDENT_AMBULATORY_CARE_PROVIDER_SITE_OTHER): Payer: Medicare Other | Admitting: Endocrinology

## 2016-03-01 VITALS — BP 140/86 | HR 68 | Temp 99.2°F | Resp 20

## 2016-03-01 DIAGNOSIS — R39198 Other difficulties with micturition: Secondary | ICD-10-CM | POA: Diagnosis not present

## 2016-03-01 DIAGNOSIS — E291 Testicular hypofunction: Secondary | ICD-10-CM

## 2016-03-01 LAB — BASIC METABOLIC PANEL
BUN: 14 mg/dL (ref 6–23)
CALCIUM: 10 mg/dL (ref 8.4–10.5)
CO2: 28 mEq/L (ref 19–32)
CREATININE: 0.65 mg/dL (ref 0.40–1.50)
Chloride: 104 mEq/L (ref 96–112)
GFR: 172.8 mL/min (ref >60.00)
GLUCOSE: 80 mg/dL (ref 70–99)
Potassium: 4.3 mEq/L (ref 3.5–5.1)
Sodium: 141 mEq/L (ref 135–145)

## 2016-03-01 LAB — TSH: TSH: 2.03 u[IU]/mL (ref 0.35–4.50)

## 2016-03-01 LAB — LUTEINIZING HORMONE: LH: 4.98 m[IU]/mL (ref 1.50–9.30)

## 2016-03-01 LAB — IBC PANEL
IRON: 46 ug/dL (ref 42–165)
Saturation Ratios: 12.3 % — ABNORMAL LOW (ref 20.0–50.0)
TRANSFERRIN: 267 mg/dL (ref 212.0–360.0)

## 2016-03-01 LAB — PSA: PSA: 1.08 ng/mL (ref 0.10–4.00)

## 2016-03-01 NOTE — Progress Notes (Signed)
Pre visit review using our clinic review tool, if applicable. No additional management support is needed unless otherwise documented below in the visit note. 

## 2016-03-01 NOTE — Patient Instructions (Addendum)
blood tests are requested for you today.  We'll let you know about the results. Based on the results, you may need an MRI of the pituitary (in the brain).  Testosterone treatment has risks, including increased or decreased fertility (depending on the type of treatment), hair loss, prostate cancer, benign prostate enlargement, blood clots, liver problems, lower hdl ("good cholesterol"), polycythemia (opposite of anemia), sleep apnea, and behavior changes.

## 2016-03-01 NOTE — Progress Notes (Signed)
Subjective:    Patient ID: Andre Clements, male    DOB: 08-Mar-1971, 45 y.o.   MRN: [id number]  HPI Pt reports he had puberty at the normal age.  He has no biological children, and is not considering having any.  He says he has never taken illicit androgens.  He has never been on any prescribed medication for hypogonadism.  He does not take antiandrogens or opioids.  He denies any h/o infertility, XRT, or genital infection.  He has never had surgery, or a serious injury to the head or genital area.  He does not consume alcohol.  He is wheelchair bound due to spinal cord injury (1998).  He has chronic weakness of the toes, and assoc fatigue.  Otherwise, he has good sensory and motor function of the LE's.   Past Medical History  Diagnosis Date  . Depression   . Hyperlipidemia   . Hypertension   . Urine incontinence   . Blood in stool   . Bipolar 2 disorder [facility])     Past Surgical History  Procedure Laterality Date  . Abdominal hernia repair  2003  . Arm surgery Right     from suicide attempt    Social History   Social History  . Marital Status: Single    Spouse Name: N/A  . Number of Children: N/A  . Years of Education: N/A   Occupational History  . Not on file.   Social History Main Topics  . Smoking status: Former Research scientist (life sciences)  . Smokeless tobacco: Not on file  . Alcohol Use: No  . Drug Use: No  . Sexual Activity: Not on file   Other Topics Concern  . Not on file   Social History Narrative    Current Outpatient Prescriptions on File Prior to Visit  Medication Sig Dispense Refill  . haloperidol (HALDOL) 5 MG tablet Take 1 tablet (5 mg total) by mouth daily. 90 tablet 0  . lisinopril (PRINIVIL,ZESTRIL) 10 MG tablet Take 1 tablet (10 mg total) by mouth daily. 90 tablet 3  . metFORMIN (GLUCOPHAGE-XR) 500 MG 24 hr tablet Take 1 tablet (500 mg total) by mouth daily with breakfast. 90 tablet 3  . omeprazole (PRILOSEC) 40 MG capsule Take 1 capsule (40 mg total) by mouth daily.  90 capsule 3  . sertraline (ZOLOFT) 100 MG tablet Take 2 tablets (200 mg total) by mouth daily. Two tablets by mouth daily 180 tablet 3  . simvastatin (ZOCOR) 40 MG tablet Take 1 tablet (40 mg total) by mouth daily at 6 PM. 90 tablet 3   No current facility-administered medications on file prior to visit.    No Known Allergies  Family History  Problem Relation Age of Onset  . Alcohol abuse Mother   . Hypertension Mother   . Alcohol abuse Father   . Other Neg Hx     hypogonadism    BP 140/86 mmHg  Pulse 68  Temp(Src) 99.2 F (37.3 C) (Oral)  Resp 20  Wt   SpO2 97%   Review of Systems denies numbness, erectile dysfunction, weight change, gynecomastia, myalgias, fever, headache, easy bruising, sob, rash, blurry vision, rhinorrhea, chest pain.  He has chronic depression, and decreased urinary stream.     Objective:   Physical Exam VS: see vs page GEN: no distress.  In wheelchair HEAD: head: no deformity eyes: no periorbital swelling, no proptosis external nose and ears are normal mouth: no lesion seen NECK: supple, thyroid is not enlarged CHEST WALL: no  deformity LUNGS: clear to auscultation BREASTS:  bilat pseudogynecomastia.  CV: reg rate and rhythm, no murmur ABD: abdomen is soft, nontender.  no hepatosplenomegaly.  not distended.  no hernia GENITALIA:  Normal male.   MUSCULOSKELETAL: muscle bulk and strength are grossly normal.  no obvious joint swelling.  gait is normal and steady EXTEMITIES: no deformity.  no edema PULSES: no carotid bruit NEURO:  cn 2-12 grossly intact.   readily moves all 4's.  sensation is intact to touch on all 4's SKIN:  Normal texture and temperature.  No rash or suspicious lesion is visible.   NODES:  None palpable at the neck PSYCH: alert, well-oriented.  Does not appear anxious nor depressed.  I have reviewed outside records, and summarized: Pt was noted to have hypogonadism, and referred here.   Lab Results  Component Value Date     TESTOSTERONE 124* 12/15/2015   CT brain (2011) normal    Assessment & Plan:  Hypogonadism, new, uncertain etiology Spinal cord injury: this does not seem to cause ED sxs.    Patient is advised the following: Patient Instructions  blood tests are requested for you today.  We'll let you know about the results. Based on the results, you may need an MRI of the pituitary (in the brain).  Testosterone treatment has risks, including increased or decreased fertility (depending on the type of treatment), hair loss, prostate cancer, benign prostate enlargement, blood clots, liver problems, lower hdl ("good cholesterol"), polycythemia (opposite of anemia), sleep apnea, and behavior changes.

## 2016-03-02 LAB — PROLACTIN: PROLACTIN: 3.4 ng/mL (ref 2.0–18.0)

## 2016-03-03 LAB — TESTOSTERONE,FREE AND TOTAL
TESTOSTERONE FREE: 5.9 pg/mL — AB (ref 6.8–21.5)
TESTOSTERONE: 178 ng/dL — AB (ref 348–1197)

## 2016-03-04 ENCOUNTER — Other Ambulatory Visit: Payer: Self-pay | Admitting: Endocrinology

## 2016-03-04 DIAGNOSIS — E230 Hypopituitarism: Secondary | ICD-10-CM

## 2016-03-17 DIAGNOSIS — R32 Unspecified urinary incontinence: Secondary | ICD-10-CM | POA: Diagnosis not present

## 2016-03-30 ENCOUNTER — Ambulatory Visit (HOSPITAL_COMMUNITY): Payer: Self-pay | Admitting: Psychiatry

## 2016-04-04 ENCOUNTER — Telehealth: Payer: Self-pay | Admitting: Endocrinology

## 2016-04-04 NOTE — Telephone Encounter (Signed)
[facility] contacted about the MRI report. Requested [facility] to contact the pt.

## 2016-04-04 NOTE — Telephone Encounter (Signed)
Patient called the team health call center, states he need to follow up on MRI appointment.  04/01/16 1:40pm

## 2016-04-07 ENCOUNTER — Telehealth: Payer: Self-pay | Admitting: Endocrinology

## 2016-04-07 DIAGNOSIS — E230 Hypopituitarism: Secondary | ICD-10-CM

## 2016-04-07 NOTE — Telephone Encounter (Signed)
I contacted [facility] and they looked at the MRI referral from 03/04/2016 and stated the order location needs to be changed to [place] [facility].

## 2016-04-07 NOTE — Telephone Encounter (Signed)
Patient stated that he haven't received a phone call about scheduling his appointment for his Mri. Please advise

## 2016-04-07 NOTE — Telephone Encounter (Signed)
done

## 2016-04-15 ENCOUNTER — Ambulatory Visit
Admission: RE | Admit: 2016-04-15 | Discharge: 2016-04-15 | Disposition: A | Discharge status: Home / Self Care | Payer: Medicare Other | Source: Ambulatory Visit | Attending: Endocrinology | Admitting: Endocrinology

## 2016-04-15 ENCOUNTER — Other Ambulatory Visit: Payer: Self-pay | Admitting: Endocrinology

## 2016-04-15 DIAGNOSIS — E291 Testicular hypofunction: Secondary | ICD-10-CM

## 2016-04-15 DIAGNOSIS — E230 Hypopituitarism: Secondary | ICD-10-CM | POA: Diagnosis not present

## 2016-04-15 MED ORDER — GADOBENATE DIMEGLUMINE 529 MG/ML IV SOLN
10.0000 mL | Freq: Once | INTRAVENOUS | Status: AC | PRN
  Administered 2016-04-15: 10 mL via INTRAVENOUS

## 2016-04-15 MED ORDER — CLOMIPHENE CITRATE 50 MG PO TABS: ORAL_TABLET | ORAL | Status: DC

## 2016-04-19 ENCOUNTER — Telehealth: Payer: Self-pay | Admitting: Endocrinology

## 2016-04-19 ENCOUNTER — Other Ambulatory Visit (HOSPITAL_COMMUNITY): Payer: Self-pay | Admitting: Psychiatry

## 2016-04-19 DIAGNOSIS — R32 Unspecified urinary incontinence: Secondary | ICD-10-CM | POA: Diagnosis not present

## 2016-04-19 NOTE — Telephone Encounter (Signed)
PT said he talked to his insurance company and the medication that Dr. Loanne Drilling prescribed him is not covered under his insurance, he wants to know if there is an alternative. I think it is the Clomid, he didn't know the name.

## 2016-04-19 NOTE — Telephone Encounter (Signed)
See note below and please advise, Thanks! 

## 2016-04-19 NOTE — Telephone Encounter (Signed)
It is not covered, but it is cheap to buy at [facility] without insurance.

## 2016-04-20 MED ORDER — CLOMIPHENE CITRATE 50 MG PO TABS: ORAL_TABLET | ORAL | Status: DC

## 2016-04-20 NOTE — Telephone Encounter (Signed)
I contacted the pt and advised of note below and voiced understanding. Rx sent to [facility].

## 2016-04-21 ENCOUNTER — Ambulatory Visit (HOSPITAL_COMMUNITY): Payer: Self-pay | Admitting: Psychiatry

## 2016-04-21 ENCOUNTER — Telehealth: Payer: Self-pay | Admitting: Endocrinology

## 2016-04-21 NOTE — Telephone Encounter (Signed)
I contacted the pt and advised of note below. He voiced understanding.

## 2016-04-21 NOTE — Telephone Encounter (Signed)
See note below and please advise, Thanks! 

## 2016-04-21 NOTE — Telephone Encounter (Signed)
PT called and asked if the medication that Dr. Loanne Drilling prescribed (he said it is the only medication that he prescribes) is a continuing medication or just for a short term

## 2016-04-21 NOTE — Telephone Encounter (Signed)
It varies according to the person. Some people take it indefinitely, and some just take for 1-2 years, then see how things are going.

## 2016-04-22 ENCOUNTER — Telehealth: Payer: Self-pay | Admitting: Internal Medicine

## 2016-04-22 NOTE — Telephone Encounter (Signed)
Pt is requesting to see a new psychiatrist who can prescribe his medication.  He is not happy at the behavior health where he is going now. He is aware she is out until next week and he states he has enough medication till he can get in with someone else

## 2016-05-06 NOTE — Telephone Encounter (Signed)
He should be able to contact a new psychiatrist then without referral at his convenience.

## 2016-05-06 NOTE — Telephone Encounter (Signed)
Patient aware and will contact a new psychiatrist.

## 2016-05-20 DIAGNOSIS — R32 Unspecified urinary incontinence: Secondary | ICD-10-CM | POA: Diagnosis not present

## 2016-05-26 ENCOUNTER — Encounter: Payer: Self-pay | Admitting: Internal Medicine

## 2016-05-26 ENCOUNTER — Ambulatory Visit (INDEPENDENT_AMBULATORY_CARE_PROVIDER_SITE_OTHER): Payer: Medicare Other | Admitting: Internal Medicine

## 2016-05-26 VITALS — BP 136/80 | HR 72 | Temp 98.5°F | Resp 14

## 2016-05-26 DIAGNOSIS — K439 Ventral hernia without obstruction or gangrene: Secondary | ICD-10-CM

## 2016-05-26 NOTE — Progress Notes (Signed)
Pre visit review using our clinic review tool, if applicable. No additional management support is needed unless otherwise documented below in the visit note. 

## 2016-05-26 NOTE — Progress Notes (Signed)
   Subjective:    Patient ID: Andre Clements, male    DOB: 04-09-1971, 45 y.o.   MRN: [id number]  HPI The patient is a 45 YO man coming in for acute visit over concern about a ventral hernia. He noticed it about 1-2 weeks ago when he was stretching. He had some pain in the area for 1-2 days afterwards. Now is not really tender. Not staying out. Has had in the past and wonders if this was the same.   Review of Systems  Constitutional: Negative for fever, appetite change, fatigue and unexpected weight change.  Respiratory: Negative for cough, chest tightness, shortness of breath and wheezing.   Cardiovascular: Negative for chest pain, palpitations and leg swelling.  Gastrointestinal: Positive for abdominal distention. Negative for nausea, abdominal pain, diarrhea and constipation.  Genitourinary:       Incontinent  Musculoskeletal: Positive for gait problem. Negative for myalgias, back pain and arthralgias.  Skin: Negative.   Neurological: Positive for weakness and numbness. Negative for dizziness, light-headedness and headaches.      Objective:   Physical Exam  Constitutional: He is oriented to person, place, and time. He appears well-developed and well-nourished.  In wheelchair  HENT:  Head: Normocephalic and atraumatic.  Eyes: EOM are normal.  Neck: Normal range of motion. No JVD present.  Cardiovascular: Normal rate and regular rhythm.   Pulmonary/Chest: Effort normal and breath sounds normal. No respiratory distress. He has no wheezes. He has no rales.  Abdominal: Soft. Bowel sounds are normal. He exhibits no distension. There is no tenderness. There is no rebound.  Small ventral hernia left abdominal wall, easily reducible and no pain.  Neurological: He is alert and oriented to person, place, and time. Coordination abnormal.  Skin: Skin is warm and dry.  Psychiatric: He has a normal mood and affect.   Filed Vitals:   05/26/16 1050  BP: 136/80  Pulse: 72  Temp: 98.5 F (36.9  C)  TempSrc: Oral  Resp: 14  SpO2: 97%      Assessment & Plan:

## 2016-05-26 NOTE — Patient Instructions (Signed)
The hernia on the stomach is not a problem. If it starts hurting you more often or staying out more then this would be a reason to have you talk to a surgeon about getting it fixed.   Ventral Hernia A ventral hernia (also called an incisional hernia) is a hernia that occurs at the site of a previous surgical cut (incision) in the abdomen. The abdominal wall spans from your lower chest down to your pelvis. If the abdominal wall is weakened from a surgical incision, a hernia can occur. A hernia is a bulge of bowel or muscle tissue pushing out on the weakened part of the abdominal wall. Ventral hernias can get bigger from straining or lifting. Obese and older people are at higher risk for a ventral hernia. People who develop infections after surgery or require repeat incisions at the same site on the abdomen are also at increased risk. CAUSES  A ventral hernia occurs because of weakness in the abdominal wall at an incision site.  SYMPTOMS  Common symptoms include:  A visible bulge or lump on the abdominal wall.  Pain or tenderness around the lump.  Increased discomfort if you cough or make a sudden movement. If the hernia has blocked part of the intestine, a serious complication can occur (incarcerated or strangulated hernia). This can become a problem that requires emergency surgery because the blood flow to the blocked intestine may be cut off. Symptoms may include:  Feeling sick to your stomach (nauseous).  Throwing up (vomiting).  Stomach swelling (distention) or bloating.  Fever.  Rapid heartbeat. DIAGNOSIS  Your health care provider will take a medical history and perform a physical exam. Various tests may be ordered, such as:  Blood tests.  Urine tests.  Ultrasonography.  X-rays.  Computed tomography (CT). TREATMENT  Watchful waiting may be all that is needed for a smaller hernia that does not cause symptoms. Your health care provider may recommend the use of a supportive  belt (truss) that helps to keep the abdominal wall intact. For larger hernias or those that cause pain, surgery to repair the hernia is usually recommended. If a hernia becomes strangulated, emergency surgery needs to be done right away. HOME CARE INSTRUCTIONS  Avoid putting pressure or strain on the abdominal area.  Avoid heavy lifting.  Use good body positioning for physical tasks. Ask your health care provider about proper body positioning.  Use a supportive belt as directed by your health care provider.  Maintain a healthy weight.  Eat foods that are high in fiber, such as whole grains, fruits, and vegetables. Fiber helps prevent difficult bowel movements (constipation).  Drink enough fluids to keep your urine clear or pale yellow.  Follow up with your health care provider as directed. SEEK MEDICAL CARE IF:   Your hernia seems to be getting larger or more painful. SEEK IMMEDIATE MEDICAL CARE IF:   You have abdominal pain that is sudden and sharp.  Your pain becomes severe.  You have repeated vomiting.  You are sweating a lot.  You notice a rapid heartbeat.  You develop a fever. MAKE SURE YOU:   Understand these instructions.  Will watch your condition.  Will get help right away if you are not doing well or get worse.   This information is not intended to replace advice given to you by your health care provider. Make sure you discuss any questions you have with your health care provider.   Document Released: 11/21/2012 Document Revised: 12/26/2014 Document Reviewed:  11/21/2012 Elsevier Interactive Patient Education [employer].

## 2016-05-26 NOTE — Assessment & Plan Note (Signed)
Talked to him about expectant management and given advice for how to decrease risk of growth with not getting constipated and avoiding abdominal strain.

## 2016-06-14 ENCOUNTER — Ambulatory Visit: Payer: Self-pay | Admitting: Internal Medicine

## 2016-06-14 ENCOUNTER — Encounter: Payer: Self-pay | Admitting: Internal Medicine

## 2016-06-20 ENCOUNTER — Telehealth: Payer: Self-pay | Admitting: Emergency Medicine

## 2016-06-20 DIAGNOSIS — R32 Unspecified urinary incontinence: Secondary | ICD-10-CM | POA: Diagnosis not present

## 2016-06-20 NOTE — Telephone Encounter (Signed)
Noted  

## 2016-06-20 NOTE — Telephone Encounter (Signed)
Patient called and wants to change brands and style of condom catheter. Might get a call from medical supply company.

## 2016-06-24 ENCOUNTER — Ambulatory Visit (INDEPENDENT_AMBULATORY_CARE_PROVIDER_SITE_OTHER): Payer: Medicare Other | Admitting: Internal Medicine

## 2016-06-24 ENCOUNTER — Encounter: Payer: Self-pay | Admitting: Internal Medicine

## 2016-06-24 ENCOUNTER — Other Ambulatory Visit (INDEPENDENT_AMBULATORY_CARE_PROVIDER_SITE_OTHER): Payer: Medicare Other

## 2016-06-24 VITALS — BP 128/70 | HR 74 | Temp 98.0°F | Resp 16

## 2016-06-24 DIAGNOSIS — E119 Type 2 diabetes mellitus without complications: Secondary | ICD-10-CM

## 2016-06-24 DIAGNOSIS — Z0000 Encounter for general adult medical examination without abnormal findings: Secondary | ICD-10-CM | POA: Diagnosis not present

## 2016-06-24 DIAGNOSIS — I10 Essential (primary) hypertension: Secondary | ICD-10-CM | POA: Diagnosis not present

## 2016-06-24 DIAGNOSIS — E291 Testicular hypofunction: Secondary | ICD-10-CM

## 2016-06-24 DIAGNOSIS — E785 Hyperlipidemia, unspecified: Secondary | ICD-10-CM

## 2016-06-24 LAB — LIPID PANEL
CHOLESTEROL: 115 mg/dL (ref 0–200)
HDL: 28.4 mg/dL — ABNORMAL LOW (ref >39.00)
LDL Cholesterol: 48 mg/dL (ref 0–99)
NonHDL: 86.89
TRIGLYCERIDES: 196 mg/dL — AB (ref 0.0–149.0)
Total CHOL/HDL Ratio: 4
VLDL: 39.2 mg/dL (ref 0.0–40.0)

## 2016-06-24 LAB — CBC
HCT: 37.1 % — ABNORMAL LOW (ref 39.0–52.0)
HEMOGLOBIN: 11.8 g/dL — AB (ref 13.0–17.0)
MCHC: 31.8 g/dL (ref 30.0–36.0)
MCV: 65.8 fl — ABNORMAL LOW (ref 78.0–100.0)
Platelets: 250 10*3/uL (ref 150.0–400.0)
RBC: 5.63 Mil/uL (ref 4.22–5.81)
RDW: 15.8 % — AB (ref 11.5–15.5)
WBC: 8.1 10*3/uL (ref 4.0–10.5)

## 2016-06-24 LAB — COMPREHENSIVE METABOLIC PANEL
ALBUMIN: 4 g/dL (ref 3.5–5.2)
ALK PHOS: 47 U/L (ref 39–117)
ALT: 21 U/L (ref 0–53)
AST: 17 U/L (ref 0–37)
BUN: 19 mg/dL (ref 6–23)
CO2: 30 mEq/L (ref 19–32)
CREATININE: 0.83 mg/dL (ref 0.40–1.50)
Calcium: 9.2 mg/dL (ref 8.4–10.5)
Chloride: 106 mEq/L (ref 96–112)
GFR: 130.13 mL/min (ref >60.00)
Glucose, Bld: 109 mg/dL — ABNORMAL HIGH (ref 70–99)
POTASSIUM: 4 meq/L (ref 3.5–5.1)
SODIUM: 143 meq/L (ref 135–145)
TOTAL PROTEIN: 6.9 g/dL (ref 6.0–8.3)
Total Bilirubin: 0.4 mg/dL (ref 0.2–1.2)

## 2016-06-24 LAB — HM DIABETES EYE EXAM

## 2016-06-24 LAB — HEMOGLOBIN A1C: HEMOGLOBIN A1C: 5.6 % (ref 4.6–6.5)

## 2016-06-24 NOTE — Assessment & Plan Note (Signed)
Checking labs, declines tetanus shot today. Referral for eye exam for his diabetes. Counseled on the need for dietary changes. Given 10 year screening recommendations.

## 2016-06-24 NOTE — Assessment & Plan Note (Signed)
Checking lipid panel and adjust as needed. Taking simvastatin and adjust as needed.

## 2016-06-24 NOTE — Assessment & Plan Note (Signed)
Referral for eye exam as he did not go as requested. Taking metformin and lisinopril. Taking statin as well. Checking HgA1c and adjust as needed. Not complicated although no eye exam.

## 2016-06-24 NOTE — Progress Notes (Signed)
   Subjective:    Patient ID: Andre Clements, male    DOB: 1971/01/29, 45 y.o.   MRN: [id number]  HPI  Here for medicare wellness, no new complaints. Please see A/P for status and treatment of chronic medical problems.   Diet: DM since diabetic Physical activity: sedentary Depression/mood screen: negative Hearing: intact to whispered voice Visual acuity: grossly normal, performs annual eye exam  ADLs: capable Fall risk: none Home safety: good Cognitive evaluation: intact to orientation, naming, recall and repetition EOL planning: adv directives discussed, not in place  I have personally reviewed and have noted 1. The patient's medical and social history - reviewed today no changes 2. Their use of alcohol, tobacco or illicit drugs 3. Their current medications and supplements 4. The patient's functional ability including ADL's, fall risks, home safety risks and hearing or visual impairment. 5. Diet and physical activities 6. Evidence for depression or mood disorders 7. Care team reviewed and updated (available in snapshot)  Review of Systems  Constitutional: Positive for activity change. Negative for fever, appetite change, fatigue and unexpected weight change.  Respiratory: Negative for cough, chest tightness, shortness of breath and wheezing.   Cardiovascular: Negative for chest pain, palpitations and leg swelling.  Gastrointestinal: Negative for nausea, abdominal pain, diarrhea, constipation and abdominal distention.  Genitourinary:       Incontinent  Musculoskeletal: Positive for gait problem. Negative for myalgias, back pain and arthralgias.  Skin: Negative.   Neurological: Positive for weakness and numbness. Negative for dizziness, light-headedness and headaches.      Objective:   Physical Exam  Constitutional: He is oriented to person, place, and time. He appears well-developed and well-nourished.  In wheelchair  HENT:  Head: Normocephalic and atraumatic.  Eyes: EOM  are normal.  Neck: Normal range of motion. No JVD present.  Cardiovascular: Normal rate and regular rhythm.   Pulmonary/Chest: Effort normal and breath sounds normal. No respiratory distress. He has no wheezes. He has no rales.  Abdominal: Soft. Bowel sounds are normal. He exhibits no distension. There is no tenderness. There is no rebound.  Small ventral hernia left abdominal wall, easily reducible and no pain.  Neurological: He is alert and oriented to person, place, and time. Coordination abnormal.  Skin: Skin is warm and dry.  Psychiatric: He has a normal mood and affect.   Filed Vitals:   06/24/16 0830  BP: 128/70  Pulse: 74  Temp: 98 F (36.7 C)  TempSrc: Oral  Resp: 16  SpO2: 97%      Assessment & Plan:

## 2016-06-24 NOTE — Patient Instructions (Signed)
We are checking the labs today and will send the results on mychart.   Health Maintenance, Male A healthy lifestyle and preventative care can promote health and wellness.  Maintain regular health, dental, and eye exams.  Eat a healthy diet. Foods like vegetables, fruits, whole grains, low-fat dairy products, and lean protein foods contain the nutrients you need and are low in calories. Decrease your intake of foods high in solid fats, added sugars, and salt. Get information about a proper diet from your health care provider, if necessary.  Regular physical exercise is one of the most important things you can do for your health. Most adults should get at least 150 minutes of moderate-intensity exercise (any activity that increases your heart rate and causes you to sweat) each week. In addition, most adults need muscle-strengthening exercises on 2 or more days a week.   Maintain a healthy weight. The body mass index (BMI) is a screening tool to identify possible weight problems. It provides an estimate of body fat based on height and weight. Your health care provider can find your BMI and can help you achieve or maintain a healthy weight. For males 20 years and older:  A BMI below 18.5 is considered underweight.  A BMI of 18.5 to 24.9 is normal.  A BMI of 25 to 29.9 is considered overweight.  A BMI of 30 and above is considered obese.  Maintain normal blood lipids and cholesterol by exercising and minimizing your intake of saturated fat. Eat a balanced diet with plenty of fruits and vegetables. Blood tests for lipids and cholesterol should begin at age 75 and be repeated every 5 years. If your lipid or cholesterol levels are high, you are over age 9, or you are at high risk for heart disease, you may need your cholesterol levels checked more frequently.Ongoing high lipid and cholesterol levels should be treated with medicines if diet and exercise are not working.  If you smoke, find out from  your health care provider how to quit. If you do not use tobacco, do not start.  Lung cancer screening is recommended for adults aged 19-80 years who are at high risk for developing lung cancer because of a history of smoking. A yearly low-dose CT scan of the lungs is recommended for people who have at least a 30-pack-year history of smoking and are current smokers or have quit within the past 15 years. A pack year of smoking is smoking an average of 1 pack of cigarettes a day for 1 year (for example, a 30-pack-year history of smoking could mean smoking 1 pack a day for 30 years or 2 packs a day for 15 years). Yearly screening should continue until the smoker has stopped smoking for at least 15 years. Yearly screening should be stopped for people who develop a health problem that would prevent them from having lung cancer treatment.  If you choose to drink alcohol, do not have more than 2 drinks per day. One drink is considered to be 12 oz (360 mL) of beer, 5 oz (150 mL) of wine, or 1.5 oz (45 mL) of liquor.  Avoid the use of street drugs. Do not share needles with anyone. Ask for help if you need support or instructions about stopping the use of drugs.  High blood pressure causes heart disease and increases the risk of stroke. High blood pressure is more likely to develop in:  People who have blood pressure in the end of the normal range (100-139/85-89 mm  Hg).  People who are overweight or obese.  People who are African American.  If you are 39-51 years of age, have your blood pressure checked every 3-5 years. If you are 49 years of age or older, have your blood pressure checked every year. You should have your blood pressure measured twice--once when you are at a hospital or clinic, and once when you are not at a hospital or clinic. Record the average of the two measurements. To check your blood pressure when you are not at a hospital or clinic, you can use:  An automated blood pressure machine  at a pharmacy.  A home blood pressure monitor.  If you are 65-35 years old, ask your health care provider if you should take aspirin to prevent heart disease.  Diabetes screening involves taking a blood sample to check your fasting blood sugar level. This should be done once every 3 years after age 55 if you are at a normal weight and without risk factors for diabetes. Testing should be considered at a younger age or be carried out more frequently if you are overweight and have at least 1 risk factor for diabetes.  Colorectal cancer can be detected and often prevented. Most routine colorectal cancer screening begins at the age of 8 and continues through age 52. However, your health care provider may recommend screening at an earlier age if you have risk factors for colon cancer. On a yearly basis, your health care provider may provide home test kits to check for hidden blood in the stool. A small camera at the end of a tube may be used to directly examine the colon (sigmoidoscopy or colonoscopy) to detect the earliest forms of colorectal cancer. Talk to your health care provider about this at age 64 when routine screening begins. A direct exam of the colon should be repeated every 5-10 years through age 64, unless early forms of precancerous polyps or small growths are found.  People who are at an increased risk for hepatitis B should be screened for this virus. You are considered at high risk for hepatitis B if:  You were born in a country where hepatitis B occurs often. Talk with your health care provider about which countries are considered high risk.  Your parents were born in a high-risk country and you have not received a shot to protect against hepatitis B (hepatitis B vaccine).  You have HIV or AIDS.  You use needles to inject street drugs.  You live with, or have sex with, someone who has hepatitis B.  You are a man who has sex with other men (MSM).  You get hemodialysis  treatment.  You take certain medicines for conditions like cancer, organ transplantation, and autoimmune conditions.  Hepatitis C blood testing is recommended for all people born from [birth year] through 1963 and any individual with known risk factors for hepatitis C.  Healthy men should no longer receive prostate-specific antigen (PSA) blood tests as part of routine cancer screening. Talk to your health care provider about prostate cancer screening.  Testicular cancer screening is not recommended for adolescents or adult males who have no symptoms. Screening includes self-exam, a health care provider exam, and other screening tests. Consult with your health care provider about any symptoms you have or any concerns you have about testicular cancer.  Practice safe sex. Use condoms and avoid high-risk sexual practices to reduce the spread of sexually transmitted infections (STIs).  You should be screened for STIs, including gonorrhea and  chlamydia if:  You are sexually active and are younger than 24 years.  You are older than 24 years, and your health care provider tells you that you are at risk for this type of infection.  Your sexual activity has changed since you were last screened, and you are at an increased risk for chlamydia or gonorrhea. Ask your health care provider if you are at risk.  If you are at risk of being infected with HIV, it is recommended that you take a prescription medicine daily to prevent HIV infection. This is called pre-exposure prophylaxis (PrEP). You are considered at risk if:  You are a man who has sex with other men (MSM).  You are a heterosexual man who is sexually active with multiple partners.  You take drugs by injection.  You are sexually active with a partner who has HIV.  Talk with your health care provider about whether you are at high risk of being infected with HIV. If you choose to begin PrEP, you should first be tested for HIV. You should then be tested  every 3 months for as long as you are taking PrEP.  Use sunscreen. Apply sunscreen liberally and repeatedly throughout the day. You should seek shade when your shadow is shorter than you. Protect yourself by wearing long sleeves, pants, a wide-brimmed hat, and sunglasses year round whenever you are outdoors.  Tell your health care provider of new moles or changes in moles, especially if there is a change in shape or color. Also, tell your health care provider if a mole is larger than the size of a pencil eraser.  A one-time screening for abdominal aortic aneurysm (AAA) and surgical repair of large AAAs by ultrasound is recommended for men aged 22-75 years who are current or former smokers.  Stay current with your vaccines (immunizations).   This information is not intended to replace advice given to you by your health care provider. Make sure you discuss any questions you have with your health care provider.   Document Released: 06/02/2008 Document Revised: 12/26/2014 Document Reviewed: 05/02/2011 Elsevier Interactive Patient Education [employer].

## 2016-06-24 NOTE — Assessment & Plan Note (Signed)
BP controlled on lisinopril 10 mg daily. Checking CMP and adjust as needed.

## 2016-06-24 NOTE — Progress Notes (Signed)
Pre visit review using our clinic review tool, if applicable. No additional management support is needed unless otherwise documented below in the visit note. 

## 2016-06-26 LAB — TESTOSTERONE, FREE, TOTAL, SHBG
SEX HORMONE BINDING: 34.5 nmol/L (ref 16.5–55.9)
TESTOSTERONE FREE: 12.7 pg/mL (ref 6.8–21.5)
Testosterone: 568 ng/dL (ref 348–1197)

## 2016-06-27 ENCOUNTER — Telehealth: Payer: Self-pay | Admitting: Emergency Medicine

## 2016-06-27 NOTE — Telephone Encounter (Signed)
Patient called wanting to see me outside of the office. I had to decline due to a conflict of interest.

## 2016-06-27 NOTE — Telephone Encounter (Signed)
Patient called but wouldn't tell me what he needed. Just told me to have you call him back. Please follow up thanks.

## 2016-07-05 ENCOUNTER — Other Ambulatory Visit: Payer: Self-pay | Admitting: Internal Medicine

## 2016-07-19 DIAGNOSIS — R32 Unspecified urinary incontinence: Secondary | ICD-10-CM | POA: Diagnosis not present

## 2016-08-03 DIAGNOSIS — R32 Unspecified urinary incontinence: Secondary | ICD-10-CM | POA: Diagnosis not present

## 2016-08-15 DIAGNOSIS — E119 Type 2 diabetes mellitus without complications: Secondary | ICD-10-CM | POA: Diagnosis not present

## 2016-08-15 DIAGNOSIS — Z0100 Encounter for examination of eyes and vision without abnormal findings: Secondary | ICD-10-CM | POA: Diagnosis not present

## 2016-08-15 LAB — HM DIABETES EYE EXAM

## 2016-08-26 ENCOUNTER — Encounter: Payer: Self-pay | Admitting: Internal Medicine

## 2016-08-30 ENCOUNTER — Telehealth: Payer: Self-pay | Admitting: Internal Medicine

## 2016-08-30 NOTE — Telephone Encounter (Signed)
I think we are getting one in the future if he wants to wait. Otherwise I am not sure where would do this.

## 2016-08-30 NOTE — Telephone Encounter (Signed)
Patient states he wants to be weighed but he is confined to wheelchair.  Would like Dr. Sharlet Salina to refer him somewhere to get weighed.

## 2016-08-31 NOTE — Telephone Encounter (Signed)
Patient aware and will wait.

## 2016-09-27 ENCOUNTER — Ambulatory Visit (INDEPENDENT_AMBULATORY_CARE_PROVIDER_SITE_OTHER): Payer: Medicare Other

## 2016-09-27 DIAGNOSIS — Z23 Encounter for immunization: Secondary | ICD-10-CM

## 2016-10-10 ENCOUNTER — Telehealth: Payer: Self-pay | Admitting: Internal Medicine

## 2016-10-10 NOTE — Telephone Encounter (Signed)
Pt called in and would like an order for all his supplies to be sent in

## 2016-10-20 NOTE — Telephone Encounter (Signed)
Spoke with patient and supplies have been sent in.

## 2016-11-22 ENCOUNTER — Other Ambulatory Visit: Payer: Self-pay | Admitting: Internal Medicine

## 2016-11-29 ENCOUNTER — Ambulatory Visit (INDEPENDENT_AMBULATORY_CARE_PROVIDER_SITE_OTHER): Payer: Medicare Other | Admitting: Internal Medicine

## 2016-11-29 ENCOUNTER — Encounter: Payer: Self-pay | Admitting: Internal Medicine

## 2016-11-29 VITALS — BP 136/74 | HR 59 | Temp 98.0°F | Resp 18 | Ht 68.0 in | Wt 226.0 lb

## 2016-11-29 DIAGNOSIS — R300 Dysuria: Secondary | ICD-10-CM

## 2016-11-29 LAB — POCT URINALYSIS DIPSTICK
BILIRUBIN UA: NEGATIVE
Glucose, UA: NEGATIVE
Ketones, UA: NEGATIVE
NITRITE UA: POSITIVE
PH UA: 7
Spec Grav, UA: 1.025
Urobilinogen, UA: NEGATIVE

## 2016-11-29 MED ORDER — CIPROFLOXACIN HCL 500 MG PO TABS: 500.0000 mg | ORAL_TABLET | Freq: Two times a day (BID) | ORAL | 0 refills | Status: DC

## 2016-11-29 NOTE — Progress Notes (Signed)
Pre visit review using our clinic review tool, if applicable. No additional management support is needed unless otherwise documented below in the visit note. 

## 2016-11-29 NOTE — Assessment & Plan Note (Signed)
Rx for ciprofloxacin. U/A in office consistent with infection.

## 2016-11-29 NOTE — Patient Instructions (Signed)
We have sent in the medicine for the bladder infection. It is called ciprofloxacin. Take 1 pill twice a day for 1 week.   If you are not feeling better call us back.

## 2016-11-29 NOTE — Progress Notes (Signed)
   Subjective:    Patient ID: Andre Clements, male    DOB: 05-24-1971, 45 y.o.   MRN: [id number]  HPI The patient is a 45 YO man coming in for urinary symptoms. Having pain with urination as well as pain in his lower stomach. Going on for several weeks and worse recently. No fevers or chills. Has been treated in the past for bladder infection. No confusion. Has not tried anything for it and gradually worsening.   Review of Systems  Constitutional: Negative for activity change, appetite change, fatigue, fever and unexpected weight change.  Respiratory: Negative.   Cardiovascular: Negative.   Gastrointestinal: Positive for abdominal pain. Negative for abdominal distention, constipation, diarrhea and nausea.  Genitourinary: Positive for dysuria, enuresis and frequency.  Musculoskeletal: Negative.   Skin: Negative.       Objective:   Physical Exam  Constitutional: He appears well-developed and well-nourished.  HENT:  Head: Normocephalic and atraumatic.  Eyes: EOM are normal.  Neck: Normal range of motion.  Cardiovascular: Normal rate and regular rhythm.   Pulmonary/Chest: Effort normal.  Abdominal: Soft. He exhibits no distension and no mass. There is tenderness. There is no rebound and no guarding.  Some tenderness in the suprapubic tenderness  Skin: Skin is warm and dry.   Vitals:   11/29/16 1137  BP: 136/74  Pulse: (!) 59  Resp: 18  Temp: 98 F (36.7 C)  TempSrc: Oral  SpO2: 97%  Weight: 226 lb (102.5 kg)  Height: 5\' 8"  (1.727 m)      Assessment & Plan:

## 2016-12-20 ENCOUNTER — Telehealth: Payer: Self-pay | Admitting: Endocrinology

## 2016-12-20 MED ORDER — CLOMIPHENE CITRATE 50 MG PO TABS: ORAL_TABLET | ORAL | 1 refills | Status: DC

## 2016-12-20 NOTE — Telephone Encounter (Signed)
Refill of clomiPHENE (CLOMID) 50 MG tablet  And he need 15 pills   [facility] ([address] [place] [phone] (Phone) [phone] (Fax)

## 2016-12-20 NOTE — Telephone Encounter (Signed)
Refills submitted per patient's request.  

## 2016-12-27 ENCOUNTER — Ambulatory Visit (INDEPENDENT_AMBULATORY_CARE_PROVIDER_SITE_OTHER): Payer: Medicare Other | Admitting: Internal Medicine

## 2016-12-27 ENCOUNTER — Other Ambulatory Visit (INDEPENDENT_AMBULATORY_CARE_PROVIDER_SITE_OTHER): Payer: Medicare Other

## 2016-12-27 ENCOUNTER — Encounter: Payer: Self-pay | Admitting: Internal Medicine

## 2016-12-27 VITALS — BP 130/88 | HR 68 | Temp 97.9°F | Resp 14 | Ht 68.0 in | Wt 222.0 lb

## 2016-12-27 DIAGNOSIS — E291 Testicular hypofunction: Secondary | ICD-10-CM

## 2016-12-27 DIAGNOSIS — I10 Essential (primary) hypertension: Secondary | ICD-10-CM

## 2016-12-27 DIAGNOSIS — E119 Type 2 diabetes mellitus without complications: Secondary | ICD-10-CM | POA: Diagnosis not present

## 2016-12-27 LAB — HEMOGLOBIN A1C: HEMOGLOBIN A1C: 5.6 % (ref 4.6–6.5)

## 2016-12-27 NOTE — Patient Instructions (Signed)
We will check the sugars today and send the results on mychart.

## 2016-12-27 NOTE — Progress Notes (Signed)
   Subjective:    Patient ID: Andre Clements, male    DOB: 12-May-1971, 46 y.o.   MRN: [id number]  HPI The patient is a 46 YO man coming in for follow up of his blood sugars. He has made some positive changes at the beginning of the year with no more sodas and less sugar in his foods. He is also thinking about starting to lift some weights. Does not want foot exam today given that he is wearing boots today. 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 Takes metformin daily and no problems with that.   Review of Systems  Constitutional: Negative for activity change, appetite change, fatigue, fever and unexpected weight change.  Respiratory: Negative.   Cardiovascular: Negative.   Gastrointestinal: Negative.   Musculoskeletal: Negative.   Skin: Negative.   Neurological: Positive for weakness and numbness. Negative for dizziness, tremors and facial asymmetry.       None new      Objective:   Physical Exam  Constitutional: He is oriented to person, place, and time. He appears well-developed and well-nourished.  HENT:  Head: Normocephalic and atraumatic.  Eyes: EOM are normal.  Neck: Normal range of motion.  Cardiovascular: Normal rate and regular rhythm.   Pulmonary/Chest: Effort normal and breath sounds normal.  Abdominal: Soft. Bowel sounds are normal. He exhibits no distension. There is no tenderness. There is no rebound.  Musculoskeletal: He exhibits no edema.  Neurological: He is alert and oriented to person, place, and time. A cranial nerve deficit is present. Coordination abnormal.  In wheelchair from prior injury  Skin: Skin is warm and dry.   Vitals:   12/27/16 1259  BP: 130/88  Pulse: 68  Resp: 14  Temp: 97.9 F (36.6 C)  TempSrc: Oral  SpO2: 99%  Weight: 222 lb (100.7 kg)  Height: 5\' 8"  (1.727 m)     Assessment & Plan:

## 2016-12-27 NOTE — Assessment & Plan Note (Addendum)
Checking HgA1c today, declines foot exam today and will do at next visit for physical. Last HgA1c at goal and not complicated. Taking ACE-I and statin.

## 2016-12-27 NOTE — Progress Notes (Signed)
Pre visit review using our clinic review tool, if applicable. No additional management support is needed unless otherwise documented below in the visit note. 

## 2016-12-28 LAB — TESTOSTERONE,FREE AND TOTAL
Testosterone, Free: 11.9 pg/mL (ref 6.8–21.5)
Testosterone: 513 ng/dL (ref 264–916)

## 2016-12-28 NOTE — Assessment & Plan Note (Signed)
BP at goal on his lisinopril. No side effects.

## 2017-01-06 ENCOUNTER — Telehealth: Payer: Self-pay | Admitting: Internal Medicine

## 2017-01-06 NOTE — Telephone Encounter (Signed)
Leg bag, night bag and catheter  [id number] is the hicpic for the leg bag.   Representative is sending over the form for the bags for the pt.

## 2017-01-06 NOTE — Telephone Encounter (Signed)
Contacted patient and LVM to call back

## 2017-01-06 NOTE — Telephone Encounter (Signed)
We need information about the names of these products as well as frequency of usage to write these prescriptions.

## 2017-01-06 NOTE — Telephone Encounter (Signed)
3 of what? The bed bags or leg bags or both? We cannot write an order without knowing how many he uses and the names of the products at least if not the brand.

## 2017-01-06 NOTE — Telephone Encounter (Signed)
Patient called back.  Patient states Andre Clements will not give him specific information in products but did give him order numbers.  Patients phone number is [phone].  Did say that Leg bags are hade my [facility] and the bedside bags are made my [facility].   Patient states he gets three every three months.

## 2017-01-06 NOTE — Telephone Encounter (Signed)
Pt called stating [facility] medical supply fax over request for bed side bag and leg bag supply. Please help, he is out of those bag. Need written rx for these order and please call to get fax # [phone]

## 2017-03-24 ENCOUNTER — Encounter: Payer: Self-pay | Admitting: Internal Medicine

## 2017-03-24 ENCOUNTER — Ambulatory Visit (INDEPENDENT_AMBULATORY_CARE_PROVIDER_SITE_OTHER): Payer: Medicare Other | Admitting: Internal Medicine

## 2017-03-24 NOTE — Progress Notes (Signed)
Patient ID: Andre Clements, male   DOB: Feb 01, 1971, 46 y.o.   MRN: [id number] Visit not completed. Needed SCAT formed filled out which was done and given back to patient.

## 2017-03-24 NOTE — Progress Notes (Signed)
Pre visit review using our clinic review tool, if applicable. No additional management support is needed unless otherwise documented below in the visit note. 

## 2017-05-19 ENCOUNTER — Telehealth: Payer: Self-pay | Admitting: Internal Medicine

## 2017-05-19 ENCOUNTER — Other Ambulatory Visit: Payer: Self-pay

## 2017-05-19 NOTE — Telephone Encounter (Signed)
Pt states he needs a new Rx for his Leg bags sent to [facility],   he has gone from a 18oz bag  to a 32oz bag and the brand is [employer]. He needs this for insurance puroposes.    [facility]  Ph # [phone]  Please call Pt for any questions

## 2017-05-23 NOTE — Telephone Encounter (Signed)
Okay to call in verbal if they can accept otherwise we can write script.

## 2017-05-24 ENCOUNTER — Ambulatory Visit: Payer: Medicare Other | Admitting: Internal Medicine

## 2017-05-24 NOTE — Telephone Encounter (Signed)
Called and gave verbal to [facility], they are going to fax form over to be signed by Dr. Sharlet Salina

## 2017-05-25 ENCOUNTER — Ambulatory Visit: Payer: Self-pay | Admitting: Internal Medicine

## 2017-05-25 ENCOUNTER — Other Ambulatory Visit: Payer: Self-pay

## 2017-05-25 MED ORDER — CLOMIPHENE CITRATE 50 MG PO TABS: ORAL_TABLET | ORAL | 1 refills | Status: DC

## 2017-05-29 ENCOUNTER — Ambulatory Visit: Payer: Self-pay | Admitting: Internal Medicine

## 2017-05-29 ENCOUNTER — Encounter: Payer: Self-pay | Admitting: Nurse Practitioner

## 2017-05-29 ENCOUNTER — Ambulatory Visit (INDEPENDENT_AMBULATORY_CARE_PROVIDER_SITE_OTHER): Payer: Medicare Other | Admitting: Nurse Practitioner

## 2017-05-29 VITALS — BP 160/108 | HR 67 | Temp 98.4°F | Ht 68.0 in | Wt 223.0 lb

## 2017-05-29 DIAGNOSIS — I10 Essential (primary) hypertension: Secondary | ICD-10-CM

## 2017-05-29 DIAGNOSIS — E782 Mixed hyperlipidemia: Secondary | ICD-10-CM | POA: Diagnosis not present

## 2017-05-29 DIAGNOSIS — E119 Type 2 diabetes mellitus without complications: Secondary | ICD-10-CM | POA: Diagnosis not present

## 2017-05-29 DIAGNOSIS — F251 Schizoaffective disorder, depressive type: Secondary | ICD-10-CM | POA: Diagnosis not present

## 2017-05-29 MED ORDER — SIMVASTATIN 40 MG PO TABS: 40.0000 mg | ORAL_TABLET | Freq: Every day | ORAL | 1 refills | Status: DC

## 2017-05-29 MED ORDER — LISINOPRIL 20 MG PO TABS: 20.0000 mg | ORAL_TABLET | Freq: Every day | ORAL | 1 refills | Status: DC

## 2017-05-29 MED ORDER — SERTRALINE HCL 100 MG PO TABS: 200.0000 mg | ORAL_TABLET | Freq: Every day | ORAL | 1 refills | Status: DC

## 2017-05-29 MED ORDER — METFORMIN HCL ER 500 MG PO TB24: 500.0000 mg | ORAL_TABLET | Freq: Every day | ORAL | 1 refills | Status: DC

## 2017-05-29 MED ORDER — OMEPRAZOLE 40 MG PO CPDR: 40.0000 mg | DELAYED_RELEASE_CAPSULE | Freq: Every day | ORAL | 1 refills | Status: DC

## 2017-05-29 NOTE — Patient Instructions (Addendum)
Return to lab in basement tomorrow.  You need to be fasting at least 6-8hrs prior to blood draw.

## 2017-05-29 NOTE — Progress Notes (Signed)
Subjective:  Patient ID: Andre Clements, male    DOB: [birth year]  Age: 46 y.o. MRN: [id number]  CC: Follow-up (6 mo fu/)   HPI  HTN: Uncontrolled. Takes lisinopril as prescribed. Does not follow low salt diet.  DM: Does not check glucose at home. Takes metformin as prescribed.  Denies nay adverse side effects.  Outpatient Medications Prior to Visit  Medication Sig Dispense Refill  . Ascorbic Acid (VITAMIN C) 250 MG CHEW Chew 2 each by mouth daily.    . Cholecalciferol (VITAMIN D3) 5000 units CAPS Take 1 capsule by mouth daily.    . clomiPHENE (CLOMID) 50 MG tablet 1/4 tab daily 24 tablet 1  . ferrous sulfate (IRON SUPPLEMENT) 325 (65 FE) MG tablet Take 488 mg by mouth daily with breakfast.    . haloperidol (HALDOL) 5 MG tablet Take 1 tablet (5 mg total) by mouth daily. 90 tablet 0  . Multiple Vitamins-Minerals (CENTRUM VITAMINTS PO) Take 1 tablet by mouth daily.    Marland Kitchen lisinopril (PRINIVIL,ZESTRIL) 10 MG tablet TAKE 1 TABLET BY MOUTH  DAILY 90 tablet 3  . metFORMIN (GLUCOPHAGE-XR) 500 MG 24 hr tablet TAKE 1 TABLET BY MOUTH  DAILY WITH BREAKFAST 90 tablet 3  . omeprazole (PRILOSEC) 40 MG capsule Take 1 capsule by mouth  daily 90 capsule 3  . sertraline (ZOLOFT) 100 MG tablet Take 2 tablets (200 mg total) by mouth daily. Two tablets by mouth daily 180 tablet 3  . simvastatin (ZOCOR) 40 MG tablet Take 1 tablet by mouth  daily at 6 pm 90 tablet 3   No facility-administered medications prior to visit.     ROS Review of Systems  Eyes: Negative for blurred vision and photophobia.  Respiratory: Negative for shortness of breath.   Cardiovascular: Negative for chest pain, palpitations and leg swelling.  Gastrointestinal: Negative for abdominal pain, diarrhea and nausea.  Neurological: Negative for tingling and sensory change.     Objective:  BP (!) 160/108   Pulse 67   Temp 98.4 F (36.9 C)   Ht 5\' 8"  (1.727 m)   Wt 223 lb (101.2 kg) Comment: TAKE OFF 37 lb for wheelchair   SpO2 98%   BMI 33.91 kg/m   BP Readings from Last 3 Encounters:  05/29/17 (!) 160/108  03/24/17 (!) 150/100  12/27/16 130/88    Wt Readings from Last 3 Encounters:  05/29/17 223 lb (101.2 kg)  03/24/17 219 lb (99.3 kg)  12/27/16 222 lb (100.7 kg)    Physical Exam  Constitutional: He is oriented to person, place, and time. No distress.  Neck: Normal range of motion. Neck supple.  Cardiovascular: Normal rate and regular rhythm.   Pulmonary/Chest: Effort normal and breath sounds normal.  Musculoskeletal: He exhibits no edema.  Neurological: He is alert and oriented to person, place, and time.  Skin: Skin is warm and dry.  Vitals reviewed.   Lab Results  Component Value Date   WBC 8.1 06/24/2016   HGB 11.8 (L) 06/24/2016   HCT 37.1 (L) 06/24/2016   PLT 250.0 06/24/2016   GLUCOSE 109 (H) 06/24/2016   CHOL 115 06/24/2016   TRIG 196.0 (H) 06/24/2016   HDL 28.40 (L) 06/24/2016   LDLDIRECT 84.0 09/14/2015   LDLCALC 48 06/24/2016   ALT 21 06/24/2016   AST 17 06/24/2016   NA 143 06/24/2016   K 4.0 06/24/2016   CL 106 06/24/2016   CREATININE 0.83 06/24/2016   BUN 19 06/24/2016   CO2 30 06/24/2016  TSH 2.03 03/01/2016   PSA 1.08 03/01/2016   HGBA1C 5.6 12/27/2016    Mr Brain W HQ Contrast  Result Date: 04/15/2016 CLINICAL DATA:  Pituitary insufficiency.  Low testosterone. EXAM: MRI HEAD WITHOUT AND WITH CONTRAST TECHNIQUE: Multiplanar, multiecho pulse sequences of the brain and surrounding structures were obtained without and with intravenous contrast. CONTRAST:  37mL MULTIHANCE GADOBENATE DIMEGLUMINE 529 MG/ML IV SOLN COMPARISON:  Head CT 10/08/2010 FINDINGS: Axial whole brain FLAIR and T2 images were not performed. There is no evidence of acute infarct, intracranial hemorrhage, mass, midline shift, or extra-axial fluid collection. No abnormal brain parenchymal or meningeal enhancement is identified. Ventricles and sulci are normal in size for age. The dedicated imaging was  performed through the sella turcica to evaluate the pituitary gland. The pituitary gland is overall within normal limits for size. There is gentle nodularity/convex contour of the superior margin of the pituitary gland with minimally heterogeneous enhancement but no discrete hypo- or hyperenhancing lesion identified. The infundibulum is midline. The optic chiasm and cavernous sinuses are unremarkable. IMPRESSION: Minimal heterogeneity/ nodularity of the pituitary gland without a discrete lesion identified. Electronically Signed   By: Logan Bores M.D.   On: 04/15/2016 14:34    Assessment & Plan:   Andre Clements was seen today for follow-up.  Diagnoses and all orders for this visit:  Type 2 diabetes mellitus without complication, without long-term current use of insulin (HCC) -     metFORMIN (GLUCOPHAGE-XR) 500 MG 24 hr tablet; Take 1 tablet (500 mg total) by mouth daily with breakfast.  Essential hypertension -     lisinopril (PRINIVIL,ZESTRIL) 20 MG tablet; Take 1 tablet (20 mg total) by mouth daily. -     Basic metabolic panel; Future  Mixed hyperlipidemia -     simvastatin (ZOCOR) 40 MG tablet; Take 1 tablet (40 mg total) by mouth daily at 6 PM. -     Lipid panel; Future -     Hepatic function panel; Future  Schizoaffective disorder, depressive type (HCC) -     sertraline (ZOLOFT) 100 MG tablet; Take 2 tablets (200 mg total) by mouth daily. Two tablets by mouth daily  Other orders -     omeprazole (PRILOSEC) 40 MG capsule; Take 1 capsule (40 mg total) by mouth daily.   I have changed Andre Clements simvastatin, omeprazole, metFORMIN, and lisinopril. I am also having him maintain his haloperidol, Multiple Vitamins-Minerals (CENTRUM VITAMINTS PO), Vitamin D3, ferrous sulfate, Vitamin C, clomiPHENE, and sertraline.  Meds ordered this encounter  Medications  . simvastatin (ZOCOR) 40 MG tablet    Sig: Take 1 tablet (40 mg total) by mouth daily at 6 PM.    Dispense:  90 tablet    Refill:  1     Order Specific Question:   Supervising Provider    Answer:   Cassandria Anger [[id number]]  . sertraline (ZOLOFT) 100 MG tablet    Sig: Take 2 tablets (200 mg total) by mouth daily. Two tablets by mouth daily    Dispense:  180 tablet    Refill:  1    Order Specific Question:   Supervising Provider    Answer:   Cassandria Anger [[id number]]  . omeprazole (PRILOSEC) 40 MG capsule    Sig: Take 1 capsule (40 mg total) by mouth daily.    Dispense:  90 capsule    Refill:  1    Order Specific Question:   Supervising Provider    Answer:   Cassandria Anger [[id number]]  .  metFORMIN (GLUCOPHAGE-XR) 500 MG 24 hr tablet    Sig: Take 1 tablet (500 mg total) by mouth daily with breakfast.    Dispense:  90 tablet    Refill:  1    Order Specific Question:   Supervising Provider    Answer:   Cassandria Anger [[id number]]  . lisinopril (PRINIVIL,ZESTRIL) 20 MG tablet    Sig: Take 1 tablet (20 mg total) by mouth daily.    Dispense:  90 tablet    Refill:  1    Order Specific Question:   Supervising Provider    Answer:   Cassandria Anger [[id number]]    Follow-up: Return in about 4 weeks (around 06/26/2017) for HTN.  Wilfred Lacy, NP

## 2017-05-31 ENCOUNTER — Other Ambulatory Visit (INDEPENDENT_AMBULATORY_CARE_PROVIDER_SITE_OTHER): Payer: Medicare Other

## 2017-05-31 DIAGNOSIS — E782 Mixed hyperlipidemia: Secondary | ICD-10-CM | POA: Diagnosis not present

## 2017-05-31 DIAGNOSIS — I10 Essential (primary) hypertension: Secondary | ICD-10-CM

## 2017-05-31 LAB — LIPID PANEL
CHOL/HDL RATIO: 4
Cholesterol: 131 mg/dL (ref 0–200)
HDL: 29.9 mg/dL — AB (ref >39.00)
NONHDL: 101.41
TRIGLYCERIDES: 221 mg/dL — AB (ref 0.0–149.0)
VLDL: 44.2 mg/dL — ABNORMAL HIGH (ref 0.0–40.0)

## 2017-05-31 LAB — BASIC METABOLIC PANEL
BUN: 16 mg/dL (ref 6–23)
CHLORIDE: 101 meq/L (ref 96–112)
CO2: 30 meq/L (ref 19–32)
CREATININE: 0.83 mg/dL (ref 0.40–1.50)
Calcium: 9.5 mg/dL (ref 8.4–10.5)
GFR: 129.56 mL/min (ref >60.00)
Glucose, Bld: 89 mg/dL (ref 70–99)
POTASSIUM: 3.9 meq/L (ref 3.5–5.1)
SODIUM: 138 meq/L (ref 135–145)

## 2017-05-31 LAB — HEPATIC FUNCTION PANEL
ALBUMIN: 4.4 g/dL (ref 3.5–5.2)
ALK PHOS: 40 U/L (ref 39–117)
ALT: 27 U/L (ref 0–53)
AST: 21 U/L (ref 0–37)
Bilirubin, Direct: 0.1 mg/dL (ref 0.0–0.3)
Total Bilirubin: 0.4 mg/dL (ref 0.2–1.2)
Total Protein: 7.2 g/dL (ref 6.0–8.3)

## 2017-05-31 LAB — LDL CHOLESTEROL, DIRECT: LDL DIRECT: 68 mg/dL

## 2017-06-01 ENCOUNTER — Telehealth: Payer: Self-pay

## 2017-06-01 NOTE — Telephone Encounter (Signed)
Left message advising that paperwork he requested to be filled out for insurance has been completed, not sure if we are supposed to fax forms or if patient needs to pick up at [facility] patient calls back, find out what we need to do with paperwork---can talk with tamara

## 2017-06-01 NOTE — Telephone Encounter (Signed)
Pt called stating that we can keep the form in his chart. He does not need it at this time and asked that we keep it on file... When he dropped the form off I was under the impression that he needed them completed and he did say to call him when it was ready.

## 2017-06-13 ENCOUNTER — Telehealth: Payer: Self-pay | Admitting: Internal Medicine

## 2017-06-13 DIAGNOSIS — Z993 Dependence on wheelchair: Secondary | ICD-10-CM

## 2017-06-13 NOTE — Telephone Encounter (Signed)
Pt called stating he would like an order put in for a driving evaluation, he is trying to get his drivers license.   [facility]  Ph # [phone] [facility]

## 2017-06-13 NOTE — Telephone Encounter (Signed)
Please advise 

## 2017-06-13 NOTE — Telephone Encounter (Signed)
OK What is the reason? Thx

## 2017-06-14 MED ORDER — EZETIMIBE 10 MG PO TABS: 10.0000 mg | ORAL_TABLET | Freq: Every day | ORAL | 3 refills | Status: DC

## 2017-06-14 NOTE — Telephone Encounter (Signed)
OK. Thx

## 2017-06-14 NOTE — Telephone Encounter (Signed)
He's permanently in a wheelchair

## 2017-06-26 ENCOUNTER — Encounter: Payer: Self-pay | Admitting: Nurse Practitioner

## 2017-06-26 ENCOUNTER — Ambulatory Visit (INDEPENDENT_AMBULATORY_CARE_PROVIDER_SITE_OTHER): Payer: Medicare Other | Admitting: Nurse Practitioner

## 2017-06-26 VITALS — BP 134/90 | HR 81 | Temp 98.6°F | Ht 68.0 in | Wt 214.0 lb

## 2017-06-26 DIAGNOSIS — I10 Essential (primary) hypertension: Secondary | ICD-10-CM

## 2017-06-26 DIAGNOSIS — E782 Mixed hyperlipidemia: Secondary | ICD-10-CM

## 2017-06-26 NOTE — Patient Instructions (Signed)
Continue current medication  Use saline nasal spray as needed for dry nasal passages.

## 2017-06-26 NOTE — Progress Notes (Signed)
Subjective:  Patient ID: Andre Clements, male    DOB: 1971-08-11  Age: 46 y.o. MRN: [id number]  CC: Follow-up (follow up--medication side effect consult---nose bleeding-headache )   HPI  HTN: Controlled.  Hyperlipidemia: Had 1episode of nosebleed and headache after 1week of taking zetia. Symptoms lasted for about 31mins, then stopped. He is now taking zetia every other day and has not had similar symptoms.  Outpatient Medications Prior to Visit  Medication Sig Dispense Refill  . Ascorbic Acid (VITAMIN C) 250 MG CHEW Chew 2 each by mouth daily.    . Cholecalciferol (VITAMIN D3) 5000 units CAPS Take 1 capsule by mouth daily.    . clomiPHENE (CLOMID) 50 MG tablet 1/4 tab daily 24 tablet 1  . ezetimibe (ZETIA) 10 MG tablet Take 1 tablet (10 mg total) by mouth daily. 30 tablet 3  . ferrous sulfate (IRON SUPPLEMENT) 325 (65 FE) MG tablet Take 488 mg by mouth daily with breakfast.    . haloperidol (HALDOL) 5 MG tablet Take 1 tablet (5 mg total) by mouth daily. 90 tablet 0  . lisinopril (PRINIVIL,ZESTRIL) 20 MG tablet Take 1 tablet (20 mg total) by mouth daily. 90 tablet 1  . metFORMIN (GLUCOPHAGE-XR) 500 MG 24 hr tablet Take 1 tablet (500 mg total) by mouth daily with breakfast. 90 tablet 1  . Multiple Vitamins-Minerals (CENTRUM VITAMINTS PO) Take 1 tablet by mouth daily.    Marland Kitchen omeprazole (PRILOSEC) 40 MG capsule Take 1 capsule (40 mg total) by mouth daily. 90 capsule 1  . sertraline (ZOLOFT) 100 MG tablet Take 2 tablets (200 mg total) by mouth daily. Two tablets by mouth daily 180 tablet 1  . simvastatin (ZOCOR) 40 MG tablet Take 1 tablet (40 mg total) by mouth daily at 6 PM. 90 tablet 1   No facility-administered medications prior to visit.     ROS See HPI  Objective:  BP 134/90   Pulse 81   Temp 98.6 F (37 C)   Ht 5\' 8"  (1.727 m)   Wt 214 lb (97.1 kg) Comment: take off 37lb for wheelchair  SpO2 99%   BMI 32.54 kg/m   BP Readings from Last 3 Encounters:  06/26/17 134/90    05/29/17 (!) 160/108  03/24/17 (!) 150/100    Wt Readings from Last 3 Encounters:  06/26/17 214 lb (97.1 kg)  05/29/17 223 lb (101.2 kg)  03/24/17 219 lb (99.3 kg)    Physical Exam  Constitutional: He is oriented to person, place, and time. No distress.  HENT:  Right Ear: External ear normal.  Left Ear: External ear normal.  Nose: Mucosal edema present. No rhinorrhea or nasal septal hematoma. No epistaxis. Right sinus exhibits no maxillary sinus tenderness and no frontal sinus tenderness. Left sinus exhibits no maxillary sinus tenderness and no frontal sinus tenderness.  Mouth/Throat: Oropharynx is clear and moist. No oropharyngeal exudate.  Eyes: No scleral icterus.  Neck: Normal range of motion. Neck supple.  Cardiovascular: Normal rate, regular rhythm and normal heart sounds.   Pulmonary/Chest: Effort normal.  Musculoskeletal: He exhibits no edema.  Lymphadenopathy:    He has no cervical adenopathy.  Neurological: He is alert and oriented to person, place, and time.  Vitals reviewed.   Lab Results  Component Value Date   WBC 8.1 06/24/2016   HGB 11.8 (L) 06/24/2016   HCT 37.1 (L) 06/24/2016   PLT 250.0 06/24/2016   GLUCOSE 89 05/31/2017   CHOL 131 05/31/2017   TRIG 221.0 (H) 05/31/2017  HDL 29.90 (L) 05/31/2017   LDLDIRECT 68.0 05/31/2017   LDLCALC 48 06/24/2016   ALT 27 05/31/2017   AST 21 05/31/2017   NA 138 05/31/2017   K 3.9 05/31/2017   CL 101 05/31/2017   CREATININE 0.83 05/31/2017   BUN 16 05/31/2017   CO2 30 05/31/2017   TSH 2.03 03/01/2016   PSA 1.08 03/01/2016   HGBA1C 5.6 12/27/2016    Andre Brain W HA Contrast  Result Date: 04/15/2016 CLINICAL DATA:  Pituitary insufficiency.  Low testosterone. EXAM: MRI HEAD WITHOUT AND WITH CONTRAST TECHNIQUE: Multiplanar, multiecho pulse sequences of the brain and surrounding structures were obtained without and with intravenous contrast. CONTRAST:  35mL MULTIHANCE GADOBENATE DIMEGLUMINE 529 MG/ML IV SOLN  COMPARISON:  Head CT 10/08/2010 FINDINGS: Axial whole brain FLAIR and T2 images were not performed. There is no evidence of acute infarct, intracranial hemorrhage, mass, midline shift, or extra-axial fluid collection. No abnormal brain parenchymal or meningeal enhancement is identified. Ventricles and sulci are normal in size for age. The dedicated imaging was performed through the sella turcica to evaluate the pituitary gland. The pituitary gland is overall within normal limits for size. There is gentle nodularity/convex contour of the superior margin of the pituitary gland with minimally heterogeneous enhancement but no discrete hypo- or hyperenhancing lesion identified. The infundibulum is midline. The optic chiasm and cavernous sinuses are unremarkable. IMPRESSION: Minimal heterogeneity/ nodularity of the pituitary gland without a discrete lesion identified. Electronically Signed   By: Logan Bores M.D.   On: 04/15/2016 14:34    Assessment & Plan:   Andre Clements was seen today for follow-up.  Diagnoses and all orders for this visit:  Essential hypertension  Mixed hyperlipidemia   I am having Andre Clements maintain his haloperidol, Multiple Vitamins-Minerals (CENTRUM VITAMINTS PO), Vitamin D3, ferrous sulfate, Vitamin C, clomiPHENE, simvastatin, sertraline, omeprazole, metFORMIN, lisinopril, and ezetimibe.  No orders of the defined types were placed in this encounter.   Follow-up: Return in about 3 months (around 09/26/2017) for with Dr. Sharlet Salina.  Andre Lacy, NP

## 2017-09-26 ENCOUNTER — Encounter: Payer: Self-pay | Admitting: Internal Medicine

## 2017-09-26 ENCOUNTER — Other Ambulatory Visit (INDEPENDENT_AMBULATORY_CARE_PROVIDER_SITE_OTHER): Payer: Medicare Other

## 2017-09-26 ENCOUNTER — Ambulatory Visit (INDEPENDENT_AMBULATORY_CARE_PROVIDER_SITE_OTHER): Payer: Medicare Other | Admitting: Internal Medicine

## 2017-09-26 VITALS — BP 124/84 | HR 66 | Temp 98.8°F | Wt 219.0 lb

## 2017-09-26 DIAGNOSIS — Z23 Encounter for immunization: Secondary | ICD-10-CM | POA: Diagnosis not present

## 2017-09-26 DIAGNOSIS — Z205 Contact with and (suspected) exposure to viral hepatitis: Secondary | ICD-10-CM

## 2017-09-26 DIAGNOSIS — R7301 Impaired fasting glucose: Secondary | ICD-10-CM

## 2017-09-26 DIAGNOSIS — G8220 Paraplegia, unspecified: Secondary | ICD-10-CM

## 2017-09-26 DIAGNOSIS — E119 Type 2 diabetes mellitus without complications: Secondary | ICD-10-CM | POA: Insufficient documentation

## 2017-09-26 DIAGNOSIS — Z0000 Encounter for general adult medical examination without abnormal findings: Secondary | ICD-10-CM

## 2017-09-26 LAB — HEMOGLOBIN A1C: Hgb A1c MFr Bld: 5.7 % (ref 4.6–6.5)

## 2017-09-26 NOTE — Progress Notes (Signed)
   Subjective:    Patient ID: Andre Clements, male    DOB: 25-Jan-1971, 46 y.o.   MRN: [id number]  HPI Here for medicare wellness and physical, no new complaints. Please see A/P for status and treatment of chronic medical problems.   Diet: DM since diabetic Physical activity: sedentary, in wheelchair Depression/mood screen: negative Hearing: intact to whispered voice Visual acuity: grossly normal, performs annual eye exam  ADLs: capable Fall risk: none, wheelchairbound Home safety: good Cognitive evaluation: intact to orientation, naming, recall and repetition EOL planning: adv directives discussed  I have personally reviewed and have noted 1. The patient's medical and social history - reviewed today no changes 2. Their use of alcohol, tobacco or illicit drugs 3. Their current medications and supplements 4. The patient's functional ability including ADL's, fall risks, home safety risks and hearing or visual impairment. 5. Diet and physical activities 6. Evidence for depression or mood disorders 7. Care team reviewed and updated (available in snapshot)  Review of Systems  Constitutional: Negative.   HENT: Negative.   Eyes: Negative.   Respiratory: Negative for cough, chest tightness and shortness of breath.   Cardiovascular: Negative for chest pain, palpitations and leg swelling.  Gastrointestinal: Negative for abdominal distention, abdominal pain, constipation, diarrhea, nausea and vomiting.  Musculoskeletal: Negative.   Skin: Negative.   Neurological: Negative.   Psychiatric/Behavioral: Negative.       Objective:   Physical Exam  Constitutional: He is oriented to person, place, and time. He appears well-developed and well-nourished.  HENT:  Head: Normocephalic and atraumatic.  Eyes: EOM are normal.  Neck: Normal range of motion.  Cardiovascular: Normal rate and regular rhythm.   Pulmonary/Chest: Effort normal and breath sounds normal. No respiratory distress. He has no  wheezes. He has no rales.  Abdominal: Soft. Bowel sounds are normal. He exhibits no distension. There is no tenderness. There is no rebound.  Musculoskeletal: He exhibits no edema.  Neurological: He is alert and oriented to person, place, and time. Coordination abnormal.  Wheelchair bound  Skin: Skin is warm and dry.  Psychiatric: He has a normal mood and affect.   Vitals:   09/26/17 1405  BP: 124/84  Pulse: 66  Temp: 98.8 F (37.1 C)  TempSrc: Oral  SpO2: 99%  Weight: 219 lb (99.3 kg)      Assessment & Plan:  Flu shot given at visit

## 2017-09-26 NOTE — Assessment & Plan Note (Signed)
Due to trauma from suicide attempt. No SI/HI. Will need a new wheelchair in the last year.

## 2017-09-26 NOTE — Assessment & Plan Note (Signed)
Colonoscopy due at 47, flu shot given at visit. Foot exam done. Declines tetanus. Pneumonia up to date. Given 10 year screening recommendations. Counseled about home safety and mole surveillance.

## 2017-09-26 NOTE — Patient Instructions (Signed)
We are checking the labs today including for hepatitis.

## 2017-09-26 NOTE — Assessment & Plan Note (Signed)
Does not have diabetes and did not. Removed modifier. This is not listed in problem list or history. Taking metformin 500 mg daily for prevention and weight management.

## 2017-09-27 LAB — HEPATITIS PANEL, ACUTE
HEP B C IGM: NONREACTIVE
HEP C AB: NONREACTIVE
Hep A IgM: NONREACTIVE
Hepatitis B Surface Ag: NONREACTIVE
SIGNAL TO CUT-OFF: 0.14 (ref <1.00)

## 2017-09-27 LAB — HEPATITIS B SURFACE ANTIBODY,QUALITATIVE: Hep B S Ab: NONREACTIVE

## 2017-10-02 ENCOUNTER — Other Ambulatory Visit: Payer: Self-pay

## 2017-10-02 NOTE — Patient Outreach (Signed)
[facility]) Care Management  10/02/2017  VALTON SCHWARTZ 1971/07/09 [id number]   Medication adherence call to Mr. Tonatiuh Mallon the reason for this call is because Mr. Alphin is showing past due under [facility] Ins.on Simvastatin 40 mg and Metformin ER 500 mg spoke to patient he said he still has some medication,mail order has been sending it early and still has medication until December he does not need any at this time.   [facility] Direct Dial [phone]  Fax [phone] [email or web address]

## 2017-11-22 ENCOUNTER — Other Ambulatory Visit: Payer: Self-pay | Admitting: Nurse Practitioner

## 2017-11-22 DIAGNOSIS — I10 Essential (primary) hypertension: Secondary | ICD-10-CM

## 2017-12-14 ENCOUNTER — Telehealth: Payer: Self-pay | Admitting: Internal Medicine

## 2017-12-14 NOTE — Telephone Encounter (Signed)
CRM for notification. See Telephone encounter for:   12/14/17.  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

## 2017-12-15 ENCOUNTER — Telehealth: Payer: Self-pay | Admitting: Internal Medicine

## 2017-12-15 NOTE — Telephone Encounter (Signed)
Pt has appt on 1/4, can patient see Sharlet Salina about this that day?

## 2017-12-15 NOTE — Telephone Encounter (Signed)
Copied from [place] [id number]. Topic: Quick Communication - See Telephone Encounter >> Dec 15, 2017  4:54 PM Vernona Rieger wrote: CRM for notification. See Telephone encounter for:   12/15/17. Pt is asking for a new order/script for a new manuel wheel chair. Please send over to [facility] Fax is [phone]

## 2017-12-21 NOTE — Telephone Encounter (Signed)
Called patient and could barely understand him as there was a lot of noise in the background. Patient states that he needs a referral to a [place] doctor but said he will bring that information with him at his visit tomorrow

## 2017-12-21 NOTE — Telephone Encounter (Signed)
Would recommend to ask Dr. Adele Schilder for his recommendation as he would know much better.

## 2017-12-22 ENCOUNTER — Encounter: Payer: Self-pay | Admitting: Internal Medicine

## 2017-12-22 ENCOUNTER — Ambulatory Visit (INDEPENDENT_AMBULATORY_CARE_PROVIDER_SITE_OTHER): Payer: Medicare Other | Admitting: Internal Medicine

## 2017-12-22 VITALS — BP 134/90 | HR 66 | Temp 98.1°F | Ht 68.0 in | Wt 229.0 lb

## 2017-12-22 DIAGNOSIS — G8220 Paraplegia, unspecified: Secondary | ICD-10-CM

## 2017-12-22 DIAGNOSIS — F39 Unspecified mood [affective] disorder: Secondary | ICD-10-CM

## 2017-12-22 NOTE — Progress Notes (Signed)
   Subjective:    Patient ID: Andre Clements, male    DOB: 07-23-1971, 47 y.o.   MRN: [id number]  HPI The patient is a 47 YO man coming in for his depression. He has been stable on zoloft and haldol for about 5 years ago. He does have some manic episodes but is able to tell that they coming on. He denies depression currently and is well controlled. Denies SI/HI. 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   [facility]: due to paralysis of lower limbs he cannot safely operate a cane or walker. He is able to self-propel safely in the home. This helps him achieve ADLs including toileting, bathing, grooming, cooking. Last wheelchair was 5 years ago.   Review of Systems  Respiratory: Negative for cough, chest tightness and shortness of breath.   Cardiovascular: Negative for chest pain, palpitations and leg swelling.  Gastrointestinal: Negative for abdominal distention, abdominal pain, constipation, diarrhea, nausea and vomiting.  Musculoskeletal: Positive for gait problem.  Skin: Negative.   Neurological: Positive for weakness and numbness.  Psychiatric/Behavioral: Negative.       Objective:   Physical Exam  Constitutional: He is oriented to person, place, and time. He appears well-developed and well-nourished.  HENT:  Head: Normocephalic and atraumatic.  Eyes: EOM are normal.  Neck: Normal range of motion.  Cardiovascular: Normal rate and regular rhythm.  Pulmonary/Chest: Effort normal and breath sounds normal. No respiratory distress. He has no wheezes. He has no rales.  Abdominal: Soft. Bowel sounds are normal. He exhibits no distension. There is no tenderness. There is no rebound.  Musculoskeletal: He exhibits no edema.  Neurological: He is alert and oriented to person, place, and time. Coordination abnormal.  Manual wheelchair  Skin: Skin is warm and dry.  Psychiatric: He has a normal mood and affect.   Vitals:   12/22/17 1514  BP: 134/90  Pulse: 66  Temp: 98.1 F (36.7 C)  TempSrc: Oral  SpO2: 99%  Height: 5\' 8"  (1.727 m)      Assessment & Plan:

## 2017-12-22 NOTE — Assessment & Plan Note (Signed)
Rx for haldol and zoloft okay from [place] and he understands that if he has worsening he will need to return to psychiatry.

## 2017-12-22 NOTE — Patient Instructions (Signed)
We can send in the haldol and the zoloft for you.  We will start the process for the wheelchair for you.

## 2017-12-22 NOTE — Assessment & Plan Note (Signed)
Needs new wheelchair and last 5 years ago.

## 2017-12-27 ENCOUNTER — Telehealth: Payer: Self-pay | Admitting: Internal Medicine

## 2017-12-27 ENCOUNTER — Telehealth: Payer: Self-pay

## 2017-12-27 NOTE — Telephone Encounter (Signed)
Copied from [place] [id number]. Topic: General - Other >> Dec 26, 2017  9:46 AM Carolyn Stare wrote:  Pt call to ask if his order for a manuel wheel chair has been sent to [facility]    [phone]

## 2017-12-27 NOTE — Telephone Encounter (Signed)
Copied from [place] [id number]. Topic: General - Other >> Dec 26, 2017  9:46 AM Carolyn Stare wrote:  Pt call to ask if his order for a manuel wheel chair has been sent to [facility]    [phone]

## 2017-12-27 NOTE — Telephone Encounter (Signed)
Patient was contacted and informed that the order has been placed and that it does take several weeks for things to go through

## 2017-12-27 NOTE — Telephone Encounter (Signed)
Patient informed that the order has been placed but it does take several weeks before things are done

## 2017-12-29 NOTE — Telephone Encounter (Signed)
Patient states that he has talked to [facility] and his insurance and states that they have not received any orders for the wheelchair. Rigged custom wheelchair is what he is talking about  Fax- [phone]

## 2017-12-29 NOTE — Telephone Encounter (Signed)
Faxed to [facility]

## 2017-12-29 NOTE — Telephone Encounter (Signed)
He told me specifically that he needed a normal manual wheelchair which we placed. Printed today which can be faxed to [facility]. I am not sure if there are separate papers that need to be filled out for a custom wheelchair.

## 2017-12-29 NOTE — Telephone Encounter (Signed)
Pt is asking for an explanation of why it takes several weeks to get his wheelchair.   Cb# [phone]

## 2018-01-03 ENCOUNTER — Ambulatory Visit: Payer: Self-pay | Admitting: *Deleted

## 2018-01-03 NOTE — Telephone Encounter (Signed)
Pt  Not having   Any  Symptoms    He  Just   Bought  A  New  Blood  Pressure  Machine    And   Would    Some   Guidance    On  How to  Operate it     And   What  The  Normal  bp  Ranges   Are      Pt  Has    internet  And  He  Was  Given   Instructions   To access    Info  On  Hypertension  And  Ranges   Pt  Was  Advised  That  Hypertensive  Crisis   Was    Systolic   Greater  The  384 or  Diastolic   Greater than 665  Pt  Was  Advised    To  Avoid caffeine    Avoid   Sodium    Additives  And  Take  Medication  As    Prescribed .latest  bp   154/91  .  Pt  Was  Advised   If bp  Greater  Than   180/110  To call  911,

## 2018-01-15 NOTE — Telephone Encounter (Signed)
Copied from [place]. Topic: General - Other >> Jan 15, 2018 12:51 PM Carolyn Stare wrote:  Shauna Hugh with [facility] is calling to follow up on a request from [place] . Diane said [place] sent over a fax asking for orders for the pt to have PT, she is calling to find out if the office received the request  request  sent over  .on 1./18/19        Please reach out to the patient

## 2018-01-15 NOTE — Telephone Encounter (Signed)
LVM informing patient that the orders have been signed and faxed to [facility] on 01/08/2018. Told him to call us back if somehow they never got it.

## 2018-01-23 ENCOUNTER — Telehealth: Payer: Self-pay | Admitting: Internal Medicine

## 2018-01-23 NOTE — Telephone Encounter (Signed)
Does patient need an appointment?

## 2018-01-23 NOTE — Telephone Encounter (Signed)
Copied from [place] [phone]. Topic: Quick Communication - See Telephone Encounter >> Jan 23, 2018  3:09 PM Synthia Innocent wrote: CRM for notification. See Telephone encounter for: patient is always hot, would like to make sure its nothing medical related. Sweats a lot. Please advise  01/23/18.

## 2018-02-14 ENCOUNTER — Encounter: Payer: Self-pay | Admitting: Internal Medicine

## 2018-02-14 ENCOUNTER — Ambulatory Visit (INDEPENDENT_AMBULATORY_CARE_PROVIDER_SITE_OTHER): Payer: Medicare Other | Admitting: Internal Medicine

## 2018-02-14 VITALS — BP 150/100 | HR 66 | Temp 98.2°F | Ht 68.0 in | Wt 223.0 lb

## 2018-02-14 DIAGNOSIS — G8929 Other chronic pain: Secondary | ICD-10-CM | POA: Diagnosis not present

## 2018-02-14 DIAGNOSIS — R300 Dysuria: Secondary | ICD-10-CM | POA: Diagnosis not present

## 2018-02-14 DIAGNOSIS — R1031 Right lower quadrant pain: Secondary | ICD-10-CM

## 2018-02-14 DIAGNOSIS — N3001 Acute cystitis with hematuria: Secondary | ICD-10-CM | POA: Diagnosis not present

## 2018-02-14 DIAGNOSIS — N390 Urinary tract infection, site not specified: Secondary | ICD-10-CM | POA: Insufficient documentation

## 2018-02-14 LAB — POCT URINALYSIS DIPSTICK
BILIRUBIN UA: NEGATIVE
GLUCOSE UA: NEGATIVE
Ketones, UA: NEGATIVE
Nitrite, UA: POSITIVE
Spec Grav, UA: 1.015 (ref 1.010–1.025)
Urobilinogen, UA: 0.2 E.U./dL
pH, UA: 7 (ref 5.0–8.0)

## 2018-02-14 MED ORDER — SULFAMETHOXAZOLE-TRIMETHOPRIM 800-160 MG PO TABS: 1.0000 | ORAL_TABLET | Freq: Two times a day (BID) | ORAL | 0 refills | Status: DC

## 2018-02-14 NOTE — Patient Instructions (Signed)
We have sent in the bactrim for the infection. Take 1 pill twice a day for 5 days.

## 2018-02-14 NOTE — Assessment & Plan Note (Signed)
Rx for bactrim, U/A done in the office consistent with infection.

## 2018-02-14 NOTE — Progress Notes (Signed)
   Subjective:    Patient ID: Andre Clements, male    DOB: May 23, 1971, 47 y.o.   MRN: [id number]  HPI The patient is a 47 YO man coming in for possible bladder infection. He is paraplegic and feels more urgency to urinate and less amounts coming out. Some change in odor. Some suprapubic pain when he has to go to the bathroom. Denies fevers or chills. Denies nausea or vomiting.   Review of Systems  Constitutional: Negative.   Respiratory: Negative for cough, chest tightness and shortness of breath.   Cardiovascular: Negative for chest pain, palpitations and leg swelling.  Gastrointestinal: Negative for abdominal distention, abdominal pain, constipation, diarrhea, nausea and vomiting.  Genitourinary: Positive for dysuria, frequency and urgency. Negative for flank pain.  Musculoskeletal: Negative.   Skin: Negative.   Neurological: Negative.   Psychiatric/Behavioral: Negative.       Objective:   Physical Exam  Constitutional: He is oriented to person, place, and time. He appears well-developed and well-nourished.  HENT:  Head: Normocephalic and atraumatic.  Eyes: EOM are normal.  Neck: Normal range of motion.  Cardiovascular: Normal rate and regular rhythm.  Pulmonary/Chest: Effort normal and breath sounds normal. No respiratory distress. He has no wheezes. He has no rales.  Abdominal: Soft. Bowel sounds are normal. He exhibits no distension. There is no tenderness. There is no rebound.  No flank pain, mild right suprapubic tenderness  Musculoskeletal: He exhibits no edema.  Neurological: He is alert and oriented to person, place, and time. Coordination abnormal.  Skin: Skin is warm and dry.   Vitals:   02/14/18 0758  BP: (!) 150/100  Pulse: 66  Temp: 98.2 F (36.8 C)  TempSrc: Oral  SpO2: 97%  Weight: 223 lb (101.2 kg)  Height: 5\' 8"  (1.727 m)      Assessment & Plan:

## 2018-03-20 ENCOUNTER — Encounter: Payer: Self-pay | Admitting: Internal Medicine

## 2018-03-20 ENCOUNTER — Ambulatory Visit (INDEPENDENT_AMBULATORY_CARE_PROVIDER_SITE_OTHER): Payer: Medicare Other | Admitting: Internal Medicine

## 2018-03-20 DIAGNOSIS — N3000 Acute cystitis without hematuria: Secondary | ICD-10-CM

## 2018-03-20 MED ORDER — CIPROFLOXACIN HCL 500 MG PO TABS: 500.0000 mg | ORAL_TABLET | Freq: Two times a day (BID) | ORAL | 0 refills | Status: DC

## 2018-03-20 NOTE — Progress Notes (Signed)
   Subjective:    Patient ID: Andre Clements, male    DOB: July 20, 1971, 47 y.o.   MRN: [id number]  HPI The patient is a 47 YO man coming in for possible uti. He is paralyzed and self caths. Thought maybe his bag was getting coiled but it was not. Denies fevers or chills. Having burning pain midline which is similar to 1 month ago. He took bactrim then and it did clear symptoms but they have returned. He would like to try cipro as he has taken that in the past and did well with it. Denies back pain.   Review of Systems  Constitutional: Negative.   Respiratory: Negative for cough, chest tightness and shortness of breath.   Cardiovascular: Negative for chest pain, palpitations and leg swelling.  Gastrointestinal: Negative for abdominal distention, abdominal pain, constipation, diarrhea, nausea and vomiting.       Burning suprapubic  Genitourinary: Negative.   Musculoskeletal: Negative.   Skin: Negative.   Neurological: Negative.       Objective:   Physical Exam  Constitutional: He is oriented to person, place, and time. He appears well-developed and well-nourished.  HENT:  Head: Normocephalic and atraumatic.  Eyes: EOM are normal.  Neck: Normal range of motion.  Cardiovascular: Normal rate and regular rhythm.  Pulmonary/Chest: Effort normal and breath sounds normal. No respiratory distress. He has no wheezes. He has no rales.  Abdominal: Soft. Bowel sounds are normal. He exhibits no distension. There is no tenderness. There is no rebound.  Musculoskeletal: He exhibits no edema.  Neurological: He is alert and oriented to person, place, and time. Coordination abnormal.  wheelchair  Skin: Skin is warm and dry.   Vitals:   03/20/18 1457  BP: 140/90  Pulse: 87  Temp: 98.2 F (36.8 C)  TempSrc: Oral  SpO2: 95%  Weight: 217 lb (98.4 kg)  Height: 5\' 8"  (1.727 m)      Assessment & Plan:

## 2018-03-20 NOTE — Patient Instructions (Addendum)
We have sent in the cipro to take 1 pill twice a day. Take 5 days and if it is gone you can stop. If still present take the whole 10 days.

## 2018-03-21 ENCOUNTER — Encounter: Payer: Self-pay | Admitting: Internal Medicine

## 2018-03-21 NOTE — Assessment & Plan Note (Signed)
Rx for cipro for UTI as symptoms similar.

## 2018-03-22 ENCOUNTER — Other Ambulatory Visit: Payer: Self-pay | Admitting: Family

## 2018-03-22 DIAGNOSIS — R300 Dysuria: Secondary | ICD-10-CM

## 2018-03-23 ENCOUNTER — Other Ambulatory Visit: Payer: Self-pay

## 2018-03-28 ENCOUNTER — Other Ambulatory Visit: Payer: Self-pay

## 2018-03-30 ENCOUNTER — Other Ambulatory Visit: Payer: Self-pay

## 2018-04-26 ENCOUNTER — Ambulatory Visit (INDEPENDENT_AMBULATORY_CARE_PROVIDER_SITE_OTHER): Payer: Medicare Other | Admitting: Internal Medicine

## 2018-04-26 ENCOUNTER — Other Ambulatory Visit (INDEPENDENT_AMBULATORY_CARE_PROVIDER_SITE_OTHER): Payer: Medicare Other

## 2018-04-26 ENCOUNTER — Encounter: Payer: Self-pay | Admitting: Internal Medicine

## 2018-04-26 VITALS — BP 140/90 | HR 65 | Temp 98.3°F | Ht 68.0 in | Wt 220.0 lb

## 2018-04-26 DIAGNOSIS — R7301 Impaired fasting glucose: Secondary | ICD-10-CM

## 2018-04-26 DIAGNOSIS — I10 Essential (primary) hypertension: Secondary | ICD-10-CM | POA: Diagnosis not present

## 2018-04-26 DIAGNOSIS — E782 Mixed hyperlipidemia: Secondary | ICD-10-CM

## 2018-04-26 LAB — COMPREHENSIVE METABOLIC PANEL
ALT: 23 U/L (ref 0–53)
AST: 21 U/L (ref 0–37)
Albumin: 4.2 g/dL (ref 3.5–5.2)
Alkaline Phosphatase: 38 U/L — ABNORMAL LOW (ref 39–117)
BUN: 12 mg/dL (ref 6–23)
CHLORIDE: 105 meq/L (ref 96–112)
CO2: 29 mEq/L (ref 19–32)
Calcium: 9.2 mg/dL (ref 8.4–10.5)
Creatinine, Ser: 0.76 mg/dL (ref 0.40–1.50)
GFR: 142.83 mL/min (ref >60.00)
GLUCOSE: 84 mg/dL (ref 70–99)
POTASSIUM: 3.7 meq/L (ref 3.5–5.1)
SODIUM: 141 meq/L (ref 135–145)
Total Bilirubin: 0.4 mg/dL (ref 0.2–1.2)
Total Protein: 7.1 g/dL (ref 6.0–8.3)

## 2018-04-26 LAB — LIPID PANEL
CHOL/HDL RATIO: 6
CHOLESTEROL: 197 mg/dL (ref 0–200)
HDL: 35 mg/dL — AB (ref >39.00)
NonHDL: 162.03
Triglycerides: 347 mg/dL — ABNORMAL HIGH (ref 0.0–149.0)
VLDL: 69.4 mg/dL — AB (ref 0.0–40.0)

## 2018-04-26 LAB — HEMOGLOBIN A1C: HEMOGLOBIN A1C: 5.6 % (ref 4.6–6.5)

## 2018-04-26 LAB — LDL CHOLESTEROL, DIRECT: LDL DIRECT: 103 mg/dL

## 2018-04-26 NOTE — Patient Instructions (Signed)
We will check the labs today and call you back about the results.    

## 2018-04-26 NOTE — Progress Notes (Signed)
   Subjective:    Patient ID: Andre Clements, male    DOB: 1971/03/31, 47 y.o.   MRN: [id number]  HPI The patient is a 47 YO man coming in for follow up of his impaired sugars (most recent HgA1c 5.7, denies diet changes, stays active upper body, checks feet often, sees podiatry due to paralysis cannot feel feet), and blood pressure (taking lisinopril 20 mg daily, denies missing doses, denies headaches or chest pains, denies side effects), and his triglycerides (taking simvastatin, high triglycerides at last draw, did not elect to start zetia, denies side effects, denies chest pains or SOB or stroke symptoms).   Review of Systems  Constitutional: Negative.   HENT: Negative.   Eyes: Negative.   Respiratory: Negative for cough, chest tightness and shortness of breath.   Cardiovascular: Negative for chest pain, palpitations and leg swelling.  Gastrointestinal: Negative for abdominal distention, abdominal pain, constipation, diarrhea, nausea and vomiting.  Musculoskeletal: Positive for gait problem.  Skin: Negative.   Neurological: Positive for weakness and numbness.  Psychiatric/Behavioral: Negative.       Objective:   Physical Exam  Constitutional: He is oriented to person, place, and time. He appears well-developed and well-nourished.  HENT:  Head: Normocephalic and atraumatic.  Eyes: EOM are normal.  Neck: Normal range of motion.  Cardiovascular: Normal rate and regular rhythm.  Pulmonary/Chest: Effort normal and breath sounds normal. No respiratory distress. He has no wheezes. He has no rales.  Abdominal: Soft. Bowel sounds are normal. He exhibits no distension. There is no tenderness. There is no rebound.  Musculoskeletal: He exhibits no edema.  Neurological: He is alert and oriented to person, place, and time. A cranial nerve deficit is present. Coordination abnormal.  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  Psychiatric: He has a normal mood and  affect.   Vitals:   04/26/18 1340 04/26/18 1415  BP: (!) 170/110 140/90  Pulse: 65   Temp: 98.3 F (36.8 C)   TempSrc: Oral   SpO2: 99%   Weight: 220 lb (99.8 kg)   Height: 5\' 8"  (1.727 m)       Assessment & Plan:

## 2018-04-27 NOTE — Assessment & Plan Note (Signed)
Taking lisinopril 20 mg daily and BP initially was high but recheck borderline. He elects to monitor for now and adjust at next visit if still borderline. There is still room for increasing dose of lisinopril. Checking CMP.

## 2018-04-27 NOTE — Assessment & Plan Note (Signed)
Checking lipid panel. Last with high triglycerides. Is taking simvastatin 40 mg daily and had been recommended to take zetia as well which he did not do.

## 2018-04-27 NOTE — Assessment & Plan Note (Signed)
Checking HgA1c and adjust as needed. Foot exam done today without changes. Taking metformin 500 mg daily for prevention of diabetes.

## 2018-06-11 ENCOUNTER — Encounter: Payer: Self-pay | Admitting: Internal Medicine

## 2018-06-11 ENCOUNTER — Ambulatory Visit (INDEPENDENT_AMBULATORY_CARE_PROVIDER_SITE_OTHER): Payer: Medicare Other | Admitting: Internal Medicine

## 2018-06-11 VITALS — BP 150/100 | HR 112 | Temp 98.8°F | Ht 68.0 in | Wt 220.0 lb

## 2018-06-11 DIAGNOSIS — I10 Essential (primary) hypertension: Secondary | ICD-10-CM | POA: Diagnosis not present

## 2018-06-11 DIAGNOSIS — N3000 Acute cystitis without hematuria: Secondary | ICD-10-CM

## 2018-06-11 DIAGNOSIS — G8220 Paraplegia, unspecified: Secondary | ICD-10-CM

## 2018-06-11 MED ORDER — CIPROFLOXACIN HCL 500 MG PO TABS: 500.0000 mg | ORAL_TABLET | Freq: Two times a day (BID) | ORAL | 0 refills | Status: DC

## 2018-06-11 NOTE — Patient Instructions (Signed)
We have given you the prescription for unna boots.   We have sent in ciprofloxacin to take 1 pill twice a day for 1 week.

## 2018-06-11 NOTE — Progress Notes (Signed)
   Subjective:    Patient ID: Andre Clements, male    DOB: 01-Dec-1971, 47 y.o.   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   Review of Systems  Constitutional: Negative for activity change, appetite change, fatigue, fever and unexpected weight change.  HENT: Negative.   Eyes: Negative.   Respiratory: Negative.   Cardiovascular: Negative.   Gastrointestinal: Positive for abdominal pain. Negative for abdominal distention, constipation, diarrhea, nausea and vomiting.  Genitourinary: Positive for dysuria and frequency. Negative for difficulty urinating, flank pain and genital sores.  Musculoskeletal: Positive for arthralgias, gait problem and myalgias. Negative for back pain, joint swelling, neck pain and neck stiffness.  Skin: Negative.   Neurological: Negative for syncope, weakness and numbness.  Psychiatric/Behavioral: Negative.       Objective:   Physical Exam  Constitutional: He is oriented to person, place, and time. He appears well-developed and well-nourished.  HENT:  Head: Normocephalic and atraumatic.  Eyes: EOM are normal.  Neck: Normal range of motion.  Cardiovascular: Normal rate and regular rhythm.  Pulmonary/Chest: Effort normal and breath sounds normal. No respiratory distress. He has no wheezes. He has no rales.  Abdominal: Soft. Bowel sounds are normal. He exhibits no  distension. There is tenderness. There is no rebound.  Some suprapubic tenderness  Musculoskeletal: He exhibits tenderness. He exhibits no edema.  Pain in the right foot with extension, passive ROM normal, no contracture.   Neurological: He is alert and oriented to person, place, and time. Coordination abnormal.  Manual wheelchair  Skin: Skin is warm and dry.  Psychiatric: He has a normal mood and affect.   Vitals:   06/11/18 1321 06/11/18 1359  BP: (!) 150/108 (!) 150/100  Pulse: (!) 112   Temp: 98.8 F (37.1 C)   TempSrc: Oral   SpO2: 98%   Weight: 220 lb (99.8 kg)   Height: 5\' 8"  (1.727 m)       Assessment & Plan:

## 2018-06-12 NOTE — Assessment & Plan Note (Signed)
Needs unna boots and given rx today to get for support. He is having pain with full extension and cannot move feet and no feeling normally so he cannot tell if they are getting extended too much. No signs of contractures on exam today.

## 2018-06-12 NOTE — Assessment & Plan Note (Signed)
BP is moderately elevated today before meds. Previous is at goal. He does have acute infection as well as pain today which could explain. Will have him resume meds and we can adjust as next visit if needed.

## 2018-06-12 NOTE — Assessment & Plan Note (Signed)
Does self-cath and has symptoms of infection today. Rx for 1 week ciprofloxacin today. If no clearance will need urine culture.

## 2018-06-22 ENCOUNTER — Other Ambulatory Visit: Payer: Self-pay | Admitting: Nurse Practitioner

## 2018-06-22 DIAGNOSIS — F251 Schizoaffective disorder, depressive type: Secondary | ICD-10-CM

## 2018-06-22 DIAGNOSIS — E782 Mixed hyperlipidemia: Secondary | ICD-10-CM

## 2018-06-22 DIAGNOSIS — E119 Type 2 diabetes mellitus without complications: Secondary | ICD-10-CM

## 2018-07-08 DIAGNOSIS — E109 Type 1 diabetes mellitus without complications: Secondary | ICD-10-CM | POA: Diagnosis not present

## 2018-07-08 DIAGNOSIS — Z0100 Encounter for examination of eyes and vision without abnormal findings: Secondary | ICD-10-CM | POA: Diagnosis not present

## 2018-07-08 LAB — HM DIABETES EYE EXAM

## 2018-07-23 ENCOUNTER — Telehealth: Payer: Self-pay | Admitting: Internal Medicine

## 2018-07-23 ENCOUNTER — Other Ambulatory Visit: Payer: Self-pay

## 2018-07-23 DIAGNOSIS — E782 Mixed hyperlipidemia: Secondary | ICD-10-CM

## 2018-07-23 DIAGNOSIS — E119 Type 2 diabetes mellitus without complications: Secondary | ICD-10-CM

## 2018-07-23 DIAGNOSIS — R2681 Unsteadiness on feet: Secondary | ICD-10-CM

## 2018-07-23 MED ORDER — SIMVASTATIN 40 MG PO TABS: 40.0000 mg | ORAL_TABLET | Freq: Every day | ORAL | 1 refills | Status: DC

## 2018-07-23 MED ORDER — OMEPRAZOLE 40 MG PO CPDR
40.0000 mg | DELAYED_RELEASE_CAPSULE | Freq: Every day | ORAL | 1 refills | Status: DC
Start: 2018-07-23 — End: 2019-01-02

## 2018-07-23 MED ORDER — METFORMIN HCL ER 500 MG PO TB24: 500.0000 mg | ORAL_TABLET | Freq: Every day | ORAL | 1 refills | Status: DC

## 2018-07-23 NOTE — Telephone Encounter (Signed)
Patient needs an updated referral for PT to get the manual wheel chair approved. States if he needs an appointment he will schedule one. Patient aware PCP is out till tomorrow

## 2018-07-23 NOTE — Telephone Encounter (Signed)
Copied from [place] [id number]. Topic: Referral - Request >> Jul 23, 2018 12:24 PM Cecelia Byars, NT wrote: Reason for CRM: Patient called and is requesting a referral for P[facility]T with [facility] and he would like a call to tell him the status .

## 2018-07-24 NOTE — Telephone Encounter (Signed)
Patient informed. 

## 2018-07-24 NOTE — Telephone Encounter (Signed)
Referral to PT placed

## 2018-07-30 ENCOUNTER — Other Ambulatory Visit: Payer: Self-pay | Admitting: Internal Medicine

## 2018-07-30 DIAGNOSIS — F251 Schizoaffective disorder, depressive type: Secondary | ICD-10-CM

## 2018-07-30 NOTE — Telephone Encounter (Signed)
Copied from [place] [id number]. Topic: Quick Communication - Rx Refill/Question >> Jul 30, 2018  3:32 PM Carolyn Stare wrote: Medicationsertraline (ZOLOFT) 100 MG tablet    Has the patient contacted their pharmacy yes    Preferred Pharmacy  [facility] order   Agent: Please be advised that RX refills may take up to 3 business days. We ask that you follow-up with your pharmacy.

## 2018-07-30 NOTE — Telephone Encounter (Signed)
Is this okay to refill? 

## 2018-07-31 MED ORDER — SERTRALINE HCL 100 MG PO TABS: 200.0000 mg | ORAL_TABLET | Freq: Every day | ORAL | 1 refills | Status: DC

## 2018-08-03 ENCOUNTER — Telehealth: Payer: Self-pay | Admitting: Internal Medicine

## 2018-08-03 NOTE — Telephone Encounter (Signed)
Copied from [place] [id number]. Topic: Referral - Request >> Jul 23, 2018 12:24 PM Cecelia Byars, NT wrote: Reason for CRM: Patient called and is requesting a referral for P[facility] with [facility] and he would like a call to tell him the status .  >> Aug 03, 2018 11:22 AM Reyne Dumas L wrote: Pt calling to check on status of this referral.  Pt is very concerned that he needs a manual wheelchair.  Pt feels like it was supposed to go to [facility] [place].  Pt called with Mikeal Hawthorne with [facility] on the phone.  Mikeal Hawthorne states that they just need a referral faxed over if this is the case.  Mikeal Hawthorne states fax for [facility] is [phone] Pt can be reached at [phone].

## 2018-08-03 NOTE — Telephone Encounter (Signed)
I have called patient and informed him that I had originally faxed these orders on 07/27/2018 to a different fax number that was on order form. I re-faxed the order to [facility] with the fax number given below and told patient to call back if they still have not received the order. Patient stated understanding

## 2018-08-03 NOTE — Telephone Encounter (Signed)
Pt calling to check on status of this referral.  Pt is very concerned that he needs a manual wheelchair.  Pt feels like it was supposed to go to [place].  Pt called with Mikeal Hawthorne with [facility] on the phone.  Mikeal Hawthorne states that they just need a referral faxed over if this is the case.  Mikeal Hawthorne states fax for [facility] is [phone] Pt can be reached at [phone].

## 2018-08-03 NOTE — Telephone Encounter (Signed)
Taken care of in another encounter this is a duplicate message

## 2018-08-06 ENCOUNTER — Other Ambulatory Visit: Payer: Self-pay | Admitting: Internal Medicine

## 2018-08-06 DIAGNOSIS — F251 Schizoaffective disorder, depressive type: Secondary | ICD-10-CM

## 2018-08-06 MED ORDER — HALOPERIDOL 5 MG PO TABS: 5.0000 mg | ORAL_TABLET | Freq: Every day | ORAL | 2 refills | Status: DC

## 2018-08-06 NOTE — Telephone Encounter (Signed)
Copied from [place] [id number]. Topic: Quick Communication - Rx Refill/Question >> Aug 06, 2018 10:11 AM Bea Graff, NT wrote: Medication: haloperidol (HALDOL) 5 MG tablet   Has the patient contacted their pharmacy? Yes.   (Agent: If no, request that the patient contact the pharmacy for the refill.) (Agent: If yes, when and what did the pharmacy advise?)  Preferred Pharmacy (with phone number or street name): [place], [place] [phone] (Phone) [phone] (Fax)    Agent: Please be advised that RX refills may take up to 3 business days. We ask that you follow-up with your pharmacy.

## 2018-08-06 NOTE — Telephone Encounter (Signed)
Rx refill request: haloperidol 5 mg   Outside provider   Last ordered: 12/31/15  LOV: 06/11/18  PCP: [place]: verified

## 2018-08-25 ENCOUNTER — Inpatient Hospital Stay (HOSPITAL_COMMUNITY)
Admission: EM | Admit: 2018-08-25 | Discharge: 2018-08-27 | DRG: 872 | Disposition: A | Discharge status: Home Health | Payer: Medicare Other | Attending: Family Medicine | Admitting: Family Medicine

## 2018-08-25 ENCOUNTER — Other Ambulatory Visit: Payer: Self-pay

## 2018-08-25 ENCOUNTER — Emergency Department (HOSPITAL_COMMUNITY): Payer: Medicare Other

## 2018-08-25 ENCOUNTER — Encounter (HOSPITAL_COMMUNITY): Payer: Self-pay | Admitting: Emergency Medicine

## 2018-08-25 DIAGNOSIS — G8220 Paraplegia, unspecified: Secondary | ICD-10-CM

## 2018-08-25 DIAGNOSIS — R109 Unspecified abdominal pain: Secondary | ICD-10-CM

## 2018-08-25 DIAGNOSIS — Z7984 Long term (current) use of oral hypoglycemic drugs: Secondary | ICD-10-CM | POA: Diagnosis not present

## 2018-08-25 DIAGNOSIS — N139 Obstructive and reflux uropathy, unspecified: Secondary | ICD-10-CM

## 2018-08-25 DIAGNOSIS — J9811 Atelectasis: Secondary | ICD-10-CM | POA: Diagnosis not present

## 2018-08-25 DIAGNOSIS — N39490 Overflow incontinence: Secondary | ICD-10-CM | POA: Diagnosis present

## 2018-08-25 DIAGNOSIS — E785 Hyperlipidemia, unspecified: Secondary | ICD-10-CM | POA: Diagnosis present

## 2018-08-25 DIAGNOSIS — E119 Type 2 diabetes mellitus without complications: Secondary | ICD-10-CM | POA: Diagnosis present

## 2018-08-25 DIAGNOSIS — E782 Mixed hyperlipidemia: Secondary | ICD-10-CM | POA: Diagnosis not present

## 2018-08-25 DIAGNOSIS — I10 Essential (primary) hypertension: Secondary | ICD-10-CM | POA: Diagnosis not present

## 2018-08-25 DIAGNOSIS — R1111 Vomiting without nausea: Secondary | ICD-10-CM | POA: Diagnosis not present

## 2018-08-25 DIAGNOSIS — F39 Unspecified mood [affective] disorder: Secondary | ICD-10-CM | POA: Diagnosis present

## 2018-08-25 DIAGNOSIS — N1330 Unspecified hydronephrosis: Secondary | ICD-10-CM | POA: Diagnosis not present

## 2018-08-25 DIAGNOSIS — Z6833 Body mass index (BMI) 33.0-33.9, adult: Secondary | ICD-10-CM

## 2018-08-25 DIAGNOSIS — N319 Neuromuscular dysfunction of bladder, unspecified: Secondary | ICD-10-CM | POA: Diagnosis not present

## 2018-08-25 DIAGNOSIS — R0902 Hypoxemia: Secondary | ICD-10-CM | POA: Diagnosis not present

## 2018-08-25 DIAGNOSIS — T148XXS Other injury of unspecified body region, sequela: Secondary | ICD-10-CM | POA: Diagnosis not present

## 2018-08-25 DIAGNOSIS — R1084 Generalized abdominal pain: Secondary | ICD-10-CM | POA: Diagnosis not present

## 2018-08-25 DIAGNOSIS — F3181 Bipolar II disorder: Secondary | ICD-10-CM | POA: Diagnosis present

## 2018-08-25 DIAGNOSIS — G8222 Paraplegia, incomplete: Secondary | ICD-10-CM | POA: Diagnosis not present

## 2018-08-25 DIAGNOSIS — Z79899 Other long term (current) drug therapy: Secondary | ICD-10-CM

## 2018-08-25 DIAGNOSIS — J9601 Acute respiratory failure with hypoxia: Secondary | ICD-10-CM | POA: Diagnosis not present

## 2018-08-25 DIAGNOSIS — K5900 Constipation, unspecified: Secondary | ICD-10-CM | POA: Diagnosis not present

## 2018-08-25 DIAGNOSIS — A419 Sepsis, unspecified organism: Secondary | ICD-10-CM | POA: Diagnosis not present

## 2018-08-25 DIAGNOSIS — D649 Anemia, unspecified: Secondary | ICD-10-CM | POA: Diagnosis not present

## 2018-08-25 DIAGNOSIS — R197 Diarrhea, unspecified: Secondary | ICD-10-CM | POA: Diagnosis not present

## 2018-08-25 DIAGNOSIS — N3000 Acute cystitis without hematuria: Secondary | ICD-10-CM | POA: Diagnosis not present

## 2018-08-25 DIAGNOSIS — Z915 Personal history of self-harm: Secondary | ICD-10-CM

## 2018-08-25 DIAGNOSIS — N3001 Acute cystitis with hematuria: Secondary | ICD-10-CM | POA: Diagnosis not present

## 2018-08-25 DIAGNOSIS — T410X5A Adverse effect of inhaled anesthetics, initial encounter: Secondary | ICD-10-CM | POA: Diagnosis not present

## 2018-08-25 DIAGNOSIS — R1033 Periumbilical pain: Secondary | ICD-10-CM | POA: Diagnosis not present

## 2018-08-25 DIAGNOSIS — N210 Calculus in bladder: Secondary | ICD-10-CM | POA: Diagnosis not present

## 2018-08-25 DIAGNOSIS — N320 Bladder-neck obstruction: Secondary | ICD-10-CM | POA: Diagnosis not present

## 2018-08-25 DIAGNOSIS — E669 Obesity, unspecified: Secondary | ICD-10-CM | POA: Diagnosis present

## 2018-08-25 DIAGNOSIS — N3090 Cystitis, unspecified without hematuria: Secondary | ICD-10-CM | POA: Diagnosis not present

## 2018-08-25 DIAGNOSIS — R110 Nausea: Secondary | ICD-10-CM | POA: Diagnosis not present

## 2018-08-25 LAB — CBC WITH DIFFERENTIAL/PLATELET
BASOS PCT: 0 %
Basophils Absolute: 0 10*3/uL (ref 0.0–0.1)
EOS ABS: 0.2 10*3/uL (ref 0.0–0.7)
EOS PCT: 1 %
HEMATOCRIT: 34.7 % — AB (ref 39.0–52.0)
Hemoglobin: 11.2 g/dL — ABNORMAL LOW (ref 13.0–17.0)
LYMPHS ABS: 1.6 10*3/uL (ref 0.7–4.0)
Lymphocytes Relative: 10 %
MCH: 20.6 pg — AB (ref 26.0–34.0)
MCHC: 32.3 g/dL (ref 30.0–36.0)
MCV: 63.9 fL — AB (ref 78.0–100.0)
MONO ABS: 1.8 10*3/uL — AB (ref 0.1–1.0)
Monocytes Relative: 11 %
NEUTROS ABS: 12.4 10*3/uL — AB (ref 1.7–7.7)
NEUTROS PCT: 78 %
PLATELETS: 373 10*3/uL (ref 150–400)
RBC: 5.43 MIL/uL (ref 4.22–5.81)
RDW: 16.2 % — AB (ref 11.5–15.5)
WBC: 16 10*3/uL — ABNORMAL HIGH (ref 4.0–10.5)

## 2018-08-25 LAB — GLUCOSE, CAPILLARY
GLUCOSE-CAPILLARY: 76 mg/dL (ref 70–99)
GLUCOSE-CAPILLARY: 143 mg/dL — AB (ref 70–99)
Glucose-Capillary: 81 mg/dL (ref 70–99)

## 2018-08-25 LAB — URINALYSIS, ROUTINE W REFLEX MICROSCOPIC
BILIRUBIN URINE: NEGATIVE
Glucose, UA: NEGATIVE mg/dL
Ketones, ur: NEGATIVE mg/dL
Nitrite: NEGATIVE
PH: 8 (ref 5.0–8.0)
Protein, ur: >=300 mg/dL — AB
RBC / HPF: >50 RBC/hpf — ABNORMAL HIGH (ref 0–5)
SPECIFIC GRAVITY, URINE: 1.01 (ref 1.005–1.030)
WBC, UA: >50 WBC/hpf — ABNORMAL HIGH (ref 0–5)

## 2018-08-25 LAB — COMPREHENSIVE METABOLIC PANEL
ALT: 14 U/L (ref 0–44)
ANION GAP: 10 (ref 5–15)
AST: 14 U/L — ABNORMAL LOW (ref 15–41)
Albumin: 3.4 g/dL — ABNORMAL LOW (ref 3.5–5.0)
Alkaline Phosphatase: 49 U/L (ref 38–126)
BILIRUBIN TOTAL: 0.6 mg/dL (ref 0.3–1.2)
BUN: 17 mg/dL (ref 6–20)
CHLORIDE: 103 mmol/L (ref 98–111)
CO2: 29 mmol/L (ref 22–32)
Calcium: 9 mg/dL (ref 8.9–10.3)
Creatinine, Ser: 1.09 mg/dL (ref 0.61–1.24)
Glucose, Bld: 112 mg/dL — ABNORMAL HIGH (ref 70–99)
POTASSIUM: 4 mmol/L (ref 3.5–5.1)
Sodium: 142 mmol/L (ref 135–145)
TOTAL PROTEIN: 7.1 g/dL (ref 6.5–8.1)

## 2018-08-25 LAB — SURGICAL PCR SCREEN
MRSA, PCR: NEGATIVE
Staphylococcus aureus: NEGATIVE

## 2018-08-25 LAB — LIPASE, BLOOD: LIPASE: 28 U/L (ref 11–51)

## 2018-08-25 MED ORDER — FERROUS SULFATE 325 (65 FE) MG PO TABS
325.0000 mg | ORAL_TABLET | Freq: Every day | ORAL | Status: DC
  Administered 2018-08-25 – 2018-08-27 (×3): 325 mg via ORAL
  Filled 2018-08-25 (×3): qty 1

## 2018-08-25 MED ORDER — MORPHINE SULFATE (PF) 2 MG/ML IV SOLN
2.0000 mg | Freq: Four times a day (QID) | INTRAVENOUS | Status: DC | PRN
  Administered 2018-08-25 – 2018-08-26 (×3): 2 mg via INTRAVENOUS
  Filled 2018-08-25 (×3): qty 1

## 2018-08-25 MED ORDER — INSULIN ASPART 100 UNIT/ML ~~LOC~~ SOLN
0.0000 [IU] | Freq: Three times a day (TID) | SUBCUTANEOUS | Status: DC
  Administered 2018-08-26 – 2018-08-27 (×2): 1 [IU] via SUBCUTANEOUS

## 2018-08-25 MED ORDER — SERTRALINE HCL 50 MG PO TABS
200.0000 mg | ORAL_TABLET | Freq: Every day | ORAL | Status: DC
  Filled 2018-08-25: qty 4

## 2018-08-25 MED ORDER — SERTRALINE HCL 100 MG PO TABS
200.0000 mg | ORAL_TABLET | Freq: Every evening | ORAL | Status: DC
  Administered 2018-08-25 – 2018-08-26 (×2): 200 mg via ORAL
  Filled 2018-08-25: qty 2

## 2018-08-25 MED ORDER — IOPAMIDOL (ISOVUE-300) INJECTION 61%
INTRAVENOUS | Status: AC
  Administered 2018-08-25: 100 mL via INTRAVENOUS
  Filled 2018-08-25: qty 100

## 2018-08-25 MED ORDER — SODIUM CHLORIDE 0.9 % IV SOLN
1.0000 g | Freq: Once | INTRAVENOUS | Status: AC
  Administered 2018-08-25: 1 g via INTRAVENOUS
  Filled 2018-08-25: qty 10

## 2018-08-25 MED ORDER — SENNA 8.6 MG PO TABS
1.0000 | ORAL_TABLET | Freq: Two times a day (BID) | ORAL | Status: DC
  Administered 2018-08-25 – 2018-08-27 (×4): 8.6 mg via ORAL
  Filled 2018-08-25 (×4): qty 1

## 2018-08-25 MED ORDER — OXYBUTYNIN CHLORIDE 5 MG PO TABS
10.0000 mg | ORAL_TABLET | Freq: Once | ORAL | Status: AC
  Administered 2018-08-25: 10 mg via ORAL
  Filled 2018-08-25: qty 2

## 2018-08-25 MED ORDER — SODIUM CHLORIDE 0.9% FLUSH
3.0000 mL | Freq: Two times a day (BID) | INTRAVENOUS | Status: DC
  Administered 2018-08-25 – 2018-08-26 (×2): 3 mL via INTRAVENOUS

## 2018-08-25 MED ORDER — HYDROCODONE-ACETAMINOPHEN 5-325 MG PO TABS
1.0000 | ORAL_TABLET | ORAL | Status: DC | PRN
  Administered 2018-08-25: 2 via ORAL
  Administered 2018-08-26: 1 via ORAL
  Filled 2018-08-25: qty 2
  Filled 2018-08-25: qty 1

## 2018-08-25 MED ORDER — PANTOPRAZOLE SODIUM 40 MG PO TBEC
40.0000 mg | DELAYED_RELEASE_TABLET | Freq: Every day | ORAL | Status: DC
  Administered 2018-08-25 – 2018-08-27 (×3): 40 mg via ORAL
  Filled 2018-08-25 (×3): qty 1

## 2018-08-25 MED ORDER — SODIUM CHLORIDE 0.9 % IV SOLN
INTRAVENOUS | Status: DC
  Administered 2018-08-25 – 2018-08-27 (×4): via INTRAVENOUS

## 2018-08-25 MED ORDER — FENTANYL CITRATE (PF) 100 MCG/2ML IJ SOLN
50.0000 ug | Freq: Once | INTRAMUSCULAR | Status: AC
  Administered 2018-08-25: 50 ug via INTRAVENOUS
  Filled 2018-08-25: qty 2

## 2018-08-25 MED ORDER — IOPAMIDOL (ISOVUE-300) INJECTION 61%
100.0000 mL | Freq: Once | INTRAVENOUS | Status: AC | PRN
  Administered 2018-08-25 (×2): 100 mL via INTRAVENOUS

## 2018-08-25 MED ORDER — SODIUM CHLORIDE 0.9 % IV SOLN: 250.0000 mL | INTRAVENOUS | Status: DC | PRN

## 2018-08-25 MED ORDER — SIMVASTATIN 40 MG PO TABS
40.0000 mg | ORAL_TABLET | Freq: Every day | ORAL | Status: DC
  Administered 2018-08-25 – 2018-08-26 (×2): 40 mg via ORAL
  Filled 2018-08-25 (×2): qty 1

## 2018-08-25 MED ORDER — LISINOPRIL 20 MG PO TABS: 20.0000 mg | ORAL_TABLET | Freq: Every day | ORAL | Status: DC

## 2018-08-25 MED ORDER — ACETAMINOPHEN 325 MG PO TABS
650.0000 mg | ORAL_TABLET | Freq: Four times a day (QID) | ORAL | Status: DC | PRN
  Administered 2018-08-25 – 2018-08-27 (×3): 650 mg via ORAL
  Filled 2018-08-25 (×4): qty 2

## 2018-08-25 MED ORDER — SODIUM CHLORIDE 0.9% FLUSH: 3.0000 mL | INTRAVENOUS | Status: DC | PRN

## 2018-08-25 MED ORDER — HALOPERIDOL 5 MG PO TABS
5.0000 mg | ORAL_TABLET | ORAL | Status: DC
  Administered 2018-08-26: 5 mg via ORAL
  Filled 2018-08-25: qty 1

## 2018-08-25 MED ORDER — SODIUM CHLORIDE 0.9 % IV BOLUS
1000.0000 mL | Freq: Once | INTRAVENOUS | Status: AC
  Administered 2018-08-25: 1000 mL via INTRAVENOUS

## 2018-08-25 MED ORDER — OXYBUTYNIN CHLORIDE 5 MG PO TABS
5.0000 mg | ORAL_TABLET | Freq: Three times a day (TID) | ORAL | Status: DC | PRN
  Administered 2018-08-26 – 2018-08-27 (×2): 5 mg via ORAL
  Filled 2018-08-25 (×2): qty 1

## 2018-08-25 MED ORDER — POLYETHYLENE GLYCOL 3350 17 G PO PACK: 17.0000 g | PACK | Freq: Every day | ORAL | Status: DC | PRN

## 2018-08-25 MED ORDER — ONDANSETRON HCL 4 MG/2ML IJ SOLN
4.0000 mg | Freq: Four times a day (QID) | INTRAMUSCULAR | Status: DC | PRN
  Administered 2018-08-25: 4 mg via INTRAVENOUS
  Filled 2018-08-25: qty 2

## 2018-08-25 MED ORDER — BELLADONNA ALKALOIDS-OPIUM 16.2-60 MG RE SUPP: 1.0000 | Freq: Three times a day (TID) | RECTAL | Status: DC | PRN

## 2018-08-25 MED ORDER — ACETAMINOPHEN 650 MG RE SUPP: 650.0000 mg | Freq: Four times a day (QID) | RECTAL | Status: DC | PRN

## 2018-08-25 MED ORDER — ONDANSETRON HCL 4 MG PO TABS: 4.0000 mg | ORAL_TABLET | Freq: Four times a day (QID) | ORAL | Status: DC | PRN

## 2018-08-25 MED ORDER — SODIUM CHLORIDE 0.9 % IV SOLN
1.0000 g | INTRAVENOUS | Status: DC
  Administered 2018-08-26 – 2018-08-27 (×2): 1 g via INTRAVENOUS
  Filled 2018-08-25 (×2): qty 1

## 2018-08-25 MED ORDER — MUPIROCIN 2 % EX OINT
1.0000 "application " | TOPICAL_OINTMENT | Freq: Two times a day (BID) | CUTANEOUS | Status: DC
  Administered 2018-08-25 – 2018-08-26 (×3): 1 via NASAL
  Filled 2018-08-25: qty 22

## 2018-08-25 MED ORDER — SODIUM CHLORIDE 0.9 % IV SOLN
INTRAVENOUS | Status: DC | PRN
  Administered 2018-08-25: 08:00:00 via INTRAVENOUS

## 2018-08-25 MED ORDER — HYDRALAZINE HCL 20 MG/ML IJ SOLN: 10.0000 mg | Freq: Four times a day (QID) | INTRAMUSCULAR | Status: DC | PRN

## 2018-08-25 NOTE — ED Notes (Addendum)
Pt came in via EMS, wheelchair was not brought to ED, and was left at pt's apt

## 2018-08-25 NOTE — Consult Note (Signed)
Urology Consult   Physician requesting consult: Dr. Leonette Monarch  Reason for consult: Bladder stone  History of Present Illness: Andre Clements is a 47 y.o. 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  The patient presents to the [facility] emergency department with a one-week history of worsening lower abdominal pain and feeling like he has "bladder constipation".  He had a CT of the abdomen/pelvis that revealed a large bladder stone with bilateral hydronephrosis.  From a urinary standpoint, the patient states that he voids only a few times per day and admits that he has took her day in order to empty his bladder adequately.  He states that most the time he just leaks urine without any true bladder sensation.  He is supposed to catheterize himself intermittently, but admits that he only does it on rare occasions.  Currently, the patient appears uncomfortable and is complaining of bladder spasms following Foley catheter placement.   Past Medical History:  Diagnosis Date  . Bipolar 2 disorder ([place])   . Blood in stool   . Depression   . Hyperlipidemia   . Hypertension   . Urine incontinence     Past Surgical History:  Procedure Laterality Date  . ABDOMINAL HERNIA REPAIR  2003  . arm surgery Right    from suicide attempt    Current Hospital Medications:  Home Meds:  Current Meds  Medication Sig  . Ascorbic Acid (VITAMIN C) 250 MG CHEW Chew 500 each by mouth daily.   . Cholecalciferol (VITAMIN D3) 5000 units CAPS Take 5,000 Units by mouth daily.   . ferrous sulfate (IRON SUPPLEMENT) 325 (65 FE) MG tablet Take 325 mg by mouth daily.   . haloperidol (HALDOL) 5 MG tablet Take 1 tablet (5 mg total) by mouth daily. (Patient taking differently: Take 5 mg by mouth every other day. )  . ibuprofen (ADVIL,MOTRIN) 200 MG tablet Take 400 mg by mouth every 6 (six) hours as needed for moderate  pain.  Marland Kitchen lisinopril (PRINIVIL,ZESTRIL) 20 MG tablet TAKE 1 TABLET BY MOUTH  DAILY (Patient taking differently: Take 20 mg by mouth daily. )  . metFORMIN (GLUCOPHAGE-XR) 500 MG 24 hr tablet Take 1 tablet (500 mg total) by mouth daily with breakfast.  . Multiple Vitamins-Minerals (CENTRUM VITAMINTS PO) Take 1 tablet by mouth daily.  Marland Kitchen omeprazole (PRILOSEC) 40 MG capsule Take 1 capsule (40 mg total) by mouth daily.  . sertraline (ZOLOFT) 100 MG tablet Take 2 tablets (200 mg total) by mouth daily. Two tablets by mouth daily (Patient taking differently: Take 200 mg by mouth daily. )  . simvastatin (ZOCOR) 40 MG tablet Take 1 tablet (40 mg total) by mouth daily at 6 PM.    Scheduled Meds: . oxybutynin  10 mg Oral Once   Continuous Infusions: . sodium chloride 150 mL/hr at 08/25/18 0908   PRN Meds:.sodium chloride, morphine injection, opium-belladonna, oxybutynin  Allergies: No Known Allergies  Family History  Problem Relation Age of Onset  . Alcohol abuse Mother   . Hypertension Mother   . Alcohol abuse Father   . Other Neg Hx        hypogonadism    Social History:  reports that he has never smoked. He has never used smokeless tobacco. He reports that he does not drink alcohol or use drugs.  ROS: A complete review of systems was performed.  All systems are negative except for pertinent findings  as noted.  Physical Exam:  Vital signs in last 24 hours: Temp:  [100 F (37.8 C)] 100 F (37.8 C) (09/07 0323) Pulse Rate:  [33-104] 89 (09/07 0901) Resp:  [16-18] 16 (09/07 0900) BP: (132-159)/(75-102) 138/82 (09/07 0900) SpO2:  [95 %-100 %] 100 % (09/07 0901) Weight:  [99.8 kg] 99.8 kg (09/07 0351) Constitutional:  Alert and oriented, the patient is uncomfortable laying in his ER gurney Cardiovascular: Regular rate and rhythm, No JVD Respiratory: Normal respiratory effort, Lungs clear bilaterally GI: Abdomen is soft, nontender, nondistended, no abdominal masses GU: Suprapubic  tenderness.  Foley catheter in place and draining cloudy yellow urine Lymphatic: No lymphadenopathy Neurologic: Diminished bilateral lower extremity motor function and sensation Psychiatric: Normal mood and affect  Laboratory Data:  Recent Labs    08/25/18 0534  WBC 16.0*  HGB 11.2*  HCT 34.7*  PLT 373    Recent Labs    08/25/18 0534  NA 142  K 4.0  CL 103  GLUCOSE 112*  BUN 17  CALCIUM 9.0  CREATININE 1.09     Results for orders placed or performed during the hospital encounter of 08/25/18 (from the past 24 hour(s))  CBC with Differential/Platelet     Status: Abnormal   Collection Time: 08/25/18  5:34 AM  Result Value Ref Range   WBC 16.0 (H) 4.0 - 10.5 K/uL   RBC 5.43 4.22 - 5.81 MIL/uL   Hemoglobin 11.2 (L) 13.0 - 17.0 g/dL   HCT 34.7 (L) 39.0 - 52.0 %   MCV 63.9 (L) 78.0 - 100.0 fL   MCH 20.6 (L) 26.0 - 34.0 pg   MCHC 32.3 30.0 - 36.0 g/dL   RDW 16.2 (H) 11.5 - 15.5 %   Platelets 373 150 - 400 K/uL   Neutrophils Relative % 78 %   Lymphocytes Relative 10 %   Monocytes Relative 11 %   Eosinophils Relative 1 %   Basophils Relative 0 %   Neutro Abs 12.4 (H) 1.7 - 7.7 K/uL   Lymphs Abs 1.6 0.7 - 4.0 K/uL   Monocytes Absolute 1.8 (H) 0.1 - 1.0 K/uL   Eosinophils Absolute 0.2 0.0 - 0.7 K/uL   Basophils Absolute 0.0 0.0 - 0.1 K/uL   RBC Morphology POLYCHROMASIA PRESENT    WBC Morphology MILD LEFT SHIFT (1-5% METAS, OCC MYELO, OCC BANDS)   Comprehensive metabolic panel     Status: Abnormal   Collection Time: 08/25/18  5:34 AM  Result Value Ref Range   Sodium 142 135 - 145 mmol/L   Potassium 4.0 3.5 - 5.1 mmol/L   Chloride 103 98 - 111 mmol/L   CO2 29 22 - 32 mmol/L   Glucose, Bld 112 (H) 70 - 99 mg/dL   BUN 17 6 - 20 mg/dL   Creatinine, Ser 1.09 0.61 - 1.24 mg/dL   Calcium 9.0 8.9 - 10.3 mg/dL   Total Protein 7.1 6.5 - 8.1 g/dL   Albumin 3.4 (L) 3.5 - 5.0 g/dL   AST 14 (L) 15 - 41 U/L   ALT 14 0 - 44 U/L   Alkaline Phosphatase 49 38 - 126 U/L   Total  Bilirubin 0.6 0.3 - 1.2 mg/dL   GFR calc non Af Amer >60 >60 mL/min   GFR calc Af Amer >60 >60 mL/min   Anion gap 10 5 - 15  Lipase, blood     Status: None   Collection Time: 08/25/18  5:34 AM  Result Value Ref Range   Lipase 28 11 -  51 U/L  Urinalysis, Routine w reflex microscopic     Status: Abnormal   Collection Time: 08/25/18  5:42 AM  Result Value Ref Range   Color, Urine YELLOW YELLOW   APPearance CLOUDY (A) CLEAR   Specific Gravity, Urine 1.010 1.005 - 1.030   pH 8.0 5.0 - 8.0   Glucose, UA NEGATIVE NEGATIVE mg/dL   Hgb urine dipstick MODERATE (A) NEGATIVE   Bilirubin Urine NEGATIVE NEGATIVE   Ketones, ur NEGATIVE NEGATIVE mg/dL   Protein, ur >=300 (A) NEGATIVE mg/dL   Nitrite NEGATIVE NEGATIVE   Leukocytes, UA LARGE (A) NEGATIVE   RBC / HPF >50 (H) 0 - 5 RBC/hpf   WBC, UA >50 (H) 0 - 5 WBC/hpf   Bacteria, UA RARE (A) NONE SEEN   Squamous Epithelial / LPF 0-5 0 - 5   WBC Clumps PRESENT    Mucus PRESENT    Budding Yeast PRESENT    Non Squamous Epithelial 0-5 (A) NONE SEEN   No results found for this or any previous visit (from the past 240 hour(s)).  Renal Function: Recent Labs    08/25/18 0534  CREATININE 1.09   Estimated Creatinine Clearance: 98.1 mL/min (by C-G formula based on SCr of 1.09 mg/dL).  Radiologic Imaging: Ct Abdomen Pelvis W Contrast  Result Date: 08/25/2018 CLINICAL DATA:  Right-sided pain.  Constipation. EXAM: CT ABDOMEN AND PELVIS WITH CONTRAST TECHNIQUE: Multidetector CT imaging of the abdomen and pelvis was performed using the standard protocol following bolus administration of intravenous contrast. CONTRAST:  128mL ISOVUE-300 IOPAMIDOL (ISOVUE-300) INJECTION 61% COMPARISON:  None. FINDINGS: Lower chest: There is a nodule in the left base on series 7, image 20 measuring 3 mm. Mild scattered atelectasis. No other abnormalities in the lung bases. Hepatobiliary: Hepatic steatosis. The liver, gallbladder, and portal vein are otherwise normal.  Pancreas: Unremarkable. No pancreatic ductal dilatation or surrounding inflammatory changes. Spleen: Normal in size without focal abnormality. Adrenals/Urinary Tract: Adrenal glands are normal. Bilateral hydronephrosis and perinephric stranding is identified. A tiny stone seen in the left kidney on series 5, image 76. A few scattered low-attenuation lesions in the kidneys are most consistent with small cysts but too small to characterize. Minimal excretion into the renal collecting systems on delayed imaging. Marked ureterectasis, right greater than left but bilateral. No ureteral stones are noted. There is a large calcification in the inferior bladder. There is bladder wall thickening and fat stranding adjacent to the bladder. Stomach/Bowel: The stomach and small bowel are unremarkable. The colon and appendix are normal. Vascular/Lymphatic: Shotty nodes in the retroperitoneum are likely reactive. The abdominal aorta is unremarkable. An IVC filter is identified. Reproductive: Prominence of the urethra as it passes the prostate. The prostate is cells otherwise unremarkable. Other: There is a fat containing umbilical hernia. Fat stranding is seen in the left buttocks. No other acute abnormalities. Musculoskeletal: Postoperative changes in the lumbar spine. No acute bony abnormalities are identified. IMPRESSION: 1. Bilateral moderate hydronephrosis, perinephric stranding, and marked ureterectasis. Given the bilateral nature, I suspect the bilateral hydronephrosis may be due to the large bladder stone. 2. The bladder is thick walled with adjacent fat stranding suggesting cystitis. Recommend correlation with urinalysis. 3. Prominence of the urethra as it passes the prostate may be postprocedural. Recommend clinical correlation. 4. 3 mm nodule in the left lung base. No follow-up needed if patient is low-risk. Non-contrast chest CT can be considered in 12 months if patient is high-risk. This recommendation follows the  consensus statement: Guidelines for Management of  Incidental Pulmonary Nodules Detected on CT Images: From the Fleischner Society 2017; Radiology 2017; 284:228-243. 5. Hepatic steatosis. 6. Nonobstructive stone in the left kidney. Probable small renal cyst, too small to characterize. 7. Shotty nodes in the retroperitoneum are likely reactive given the renal abnormality discussed above. 8. Tiny fat containing umbilical hernia. 9. Possible cellulitis in the left buttocks. Electronically Signed   By: Dorise Bullion III M.D   On: 08/25/2018 08:29   Dg Abd Acute W/chest  Result Date: 08/25/2018 CLINICAL DATA:  Abdominal pain. Constipation. Patient took a laxative and had diarrhea. History of hypertension. Nonsmoker. EXAM: DG ABDOMEN ACUTE W/ 1V CHEST COMPARISON:  Chest 10/08/2010 FINDINGS: Shallow inspiration. Mild cardiac enlargement. Lungs are clear and expanded. No blunting of costophrenic angles. No pneumothorax. Scattered gas and stool in the colon. Scattered gas-filled small bowel. No small or large bowel dilatation. Some of the small bowel loops suggest wall thickening which could indicate enteritis. No free intra-abdominal air. No abnormal air-fluid levels. No radiopaque stones. Inferior vena caval filter. Postoperative changes at the thoracolumbar junction. Density in the pelvis could represent a large calcified bladder stone or residual contrast material in the bladder. IMPRESSION: Mild cardiac enlargement. No evidence of active pulmonary disease. Nonobstructive bowel gas pattern with scattered gas in the colon and small bowel possibly indicating ileus or enteritis. Electronically Signed   By: Lucienne Capers M.D.   On: 08/25/2018 05:31    I independently reviewed the above imaging studies.  Impression/Recommendation Bilateral hydronephrosis due to bladder outlet obstruction from a 6 x 4.5 cm bladder stone Overflow incontinence Suprapubic pain/bladder spasms Acute cystitis Bilateral partial lower  extremity paraplegia  -The risk, benefits and alternatives of cystolitholapaxy was discussed with the patient.  Risks include, but are not limited to, urinary tract infection, bleeding, bladder injury, bladder perforation, the potential need for prolonged Foley catheterization, MI, CVA, PE, DVT and the inherent risks with general anesthesia.  He voices understanding wishes to proceed.  Plan to proceed to the operating room on 08/26/2018 -Urine culture pending.  Continue empiric IV Rocephin -PRN oxybutynin and B&O suppositories ordered for bladder spasms.  Keep Foley catheter in place.  Ellison Hughs, MD [facility] 08/25/2018, 9:36 AM

## 2018-08-25 NOTE — ED Notes (Addendum)
ED TO INPATIENT HANDOFF REPORT  Name/Age/Gender Andre Clements 47 y.o. male  Code Status   Home/SNF/Other Home  Chief Complaint Abdonial pain  Level of Care/Admitting Diagnosis ED Disposition    ED Disposition Condition Comment   Admit  The patient appears reasonably stabilized for admission considering the current resources, flow, and capabilities available in the ED at this time, and I doubt any other [facility] requiring further screening and/or treatment in the ED prior to admission is  present.       Medical History Past Medical History:  Diagnosis Date  . Bipolar 2 disorder ([place])   . Blood in stool   . Depression   . Hyperlipidemia   . Hypertension   . Urine incontinence     Allergies No Known Allergies  IV Location/Drains/Wounds Patient Lines/Drains/Airways Status   Active Line/Drains/Airways    Name:   Placement date:   Placement time:   Site:   Days:   Peripheral IV 08/25/18 Left Forearm   08/25/18    0649    Forearm   less than 1   Urethral Catheter [place] RN 16 Fr.   08/25/18    0900    -   less than 1          Labs/Imaging Results for orders placed or performed during the hospital encounter of 08/25/18 (from the past 48 hour(s))  CBC with Differential/Platelet     Status: Abnormal   Collection Time: 08/25/18  5:34 AM  Result Value Ref Range   WBC 16.0 (H) 4.0 - 10.5 K/uL   RBC 5.43 4.22 - 5.81 MIL/uL   Hemoglobin 11.2 (L) 13.0 - 17.0 g/dL   HCT 34.7 (L) 39.0 - 52.0 %   MCV 63.9 (L) 78.0 - 100.0 fL   MCH 20.6 (L) 26.0 - 34.0 pg   MCHC 32.3 30.0 - 36.0 g/dL   RDW 16.2 (H) 11.5 - 15.5 %   Platelets 373 150 - 400 K/uL   Neutrophils Relative % 78 %   Lymphocytes Relative 10 %   Monocytes Relative 11 %   Eosinophils Relative 1 %   Basophils Relative 0 %   Neutro Abs 12.4 (H) 1.7 - 7.7 K/uL   Lymphs Abs 1.6 0.7 - 4.0 K/uL   Monocytes Absolute 1.8 (H) 0.1 - 1.0 K/uL   Eosinophils Absolute 0.2 0.0 - 0.7 K/uL   Basophils Absolute 0.0 0.0 - 0.1 K/uL    RBC Morphology POLYCHROMASIA PRESENT    WBC Morphology MILD LEFT SHIFT (1-5% METAS, OCC MYELO, OCC BANDS)     Comment: Performed at [facility], [place] [address], [place], [place] [address]  Comprehensive metabolic panel     Status: Abnormal   Collection Time: 08/25/18  5:34 AM  Result Value Ref Range   Sodium 142 135 - 145 mmol/L   Potassium 4.0 3.5 - 5.1 mmol/L   Chloride 103 98 - 111 mmol/L   CO2 29 22 - 32 mmol/L   Glucose, Bld 112 (H) 70 - 99 mg/dL   BUN 17 6 - 20 mg/dL   Creatinine, Ser 1.09 0.61 - 1.24 mg/dL   Calcium 9.0 8.9 - 10.3 mg/dL   Total Protein 7.1 6.5 - 8.1 g/dL   Albumin 3.4 (L) 3.5 - 5.0 g/dL   AST 14 (L) 15 - 41 U/L   ALT 14 0 - 44 U/L   Alkaline Phosphatase 49 38 - 126 U/L   Total Bilirubin 0.6 0.3 - 1.2 mg/dL  GFR calc non Af Amer >60 >60 mL/min   GFR calc Af Amer >60 >60 mL/min    Comment: (NOTE) The eGFR has been calculated using the CKD EPI equation. This calculation has not been validated in all clinical situations. eGFR's persistently <60 mL/min signify possible Chronic Kidney Disease.    Anion gap 10 5 - 15    Comment: Performed at [facility], [place] [address] [place], [place] [address]  Lipase, blood     Status: None   Collection Time: 08/25/18  5:34 AM  Result Value Ref Range   Lipase 28 11 - 51 U/L    Comment: Performed at [facility], [place] [address] [place], [place] [address]  Urinalysis, Routine w reflex microscopic     Status: Abnormal   Collection Time: 08/25/18  5:42 AM  Result Value Ref Range   Color, Urine YELLOW YELLOW   APPearance CLOUDY (A) CLEAR   Specific Gravity, Urine 1.010 1.005 - 1.030   pH 8.0 5.0 - 8.0   Glucose, UA NEGATIVE NEGATIVE mg/dL   Hgb urine dipstick MODERATE (A) NEGATIVE   Bilirubin Urine NEGATIVE NEGATIVE   Ketones, ur NEGATIVE NEGATIVE mg/dL   Protein, ur >=300 (A) NEGATIVE mg/dL   Nitrite NEGATIVE NEGATIVE   Leukocytes, UA LARGE (A) NEGATIVE    RBC / HPF >50 (H) 0 - 5 RBC/hpf   WBC, UA >50 (H) 0 - 5 WBC/hpf   Bacteria, UA RARE (A) NONE SEEN   Squamous Epithelial / LPF 0-5 0 - 5   WBC Clumps PRESENT    Mucus PRESENT    Budding Yeast PRESENT    Non Squamous Epithelial 0-5 (A) NONE SEEN    Comment: Performed at [facility], [place] [address], [place], [place] [address]   Ct Abdomen Pelvis W Contrast  Result Date: 08/25/2018 CLINICAL DATA:  Right-sided pain.  Constipation. EXAM: CT ABDOMEN AND PELVIS WITH CONTRAST TECHNIQUE: Multidetector CT imaging of the abdomen and pelvis was performed using the standard protocol following bolus administration of intravenous contrast. CONTRAST:  139m ISOVUE-300 IOPAMIDOL (ISOVUE-300) INJECTION 61% COMPARISON:  None. FINDINGS: Lower chest: There is a nodule in the left base on series 7, image 20 measuring 3 mm. Mild scattered atelectasis. No other abnormalities in the lung bases. Hepatobiliary: Hepatic steatosis. The liver, gallbladder, and portal vein are otherwise normal. Pancreas: Unremarkable. No pancreatic ductal dilatation or surrounding inflammatory changes. Spleen: Normal in size without focal abnormality. Adrenals/Urinary Tract: Adrenal glands are normal. Bilateral hydronephrosis and perinephric stranding is identified. A tiny stone seen in the left kidney on series 5, image 76. A few scattered low-attenuation lesions in the kidneys are most consistent with small cysts but too small to characterize. Minimal excretion into the renal collecting systems on delayed imaging. Marked ureterectasis, right greater than left but bilateral. No ureteral stones are noted. There is a large calcification in the inferior bladder. There is bladder wall thickening and fat stranding adjacent to the bladder. Stomach/Bowel: The stomach and small bowel are unremarkable. The colon and appendix are normal. Vascular/Lymphatic: Shotty nodes in the retroperitoneum are likely reactive. The abdominal aorta is  unremarkable. An IVC filter is identified. Reproductive: Prominence of the urethra as it passes the prostate. The prostate is cells otherwise unremarkable. Other: There is a fat containing umbilical hernia. Fat stranding is seen in the left buttocks. No other acute abnormalities. Musculoskeletal: Postoperative changes in the lumbar spine. No acute bony abnormalities are identified. IMPRESSION: 1. Bilateral moderate hydronephrosis,  perinephric stranding, and marked ureterectasis. Given the bilateral nature, I suspect the bilateral hydronephrosis may be due to the large bladder stone. 2. The bladder is thick walled with adjacent fat stranding suggesting cystitis. Recommend correlation with urinalysis. 3. Prominence of the urethra as it passes the prostate may be postprocedural. Recommend clinical correlation. 4. 3 mm nodule in the left lung base. No follow-up needed if patient is low-risk. Non-contrast chest CT can be considered in 12 months if patient is high-risk. This recommendation follows the consensus statement: Guidelines for Management of Incidental Pulmonary Nodules Detected on CT Images: From the Fleischner Society 2017; Radiology 2017; 284:228-243. 5. Hepatic steatosis. 6. Nonobstructive stone in the left kidney. Probable small renal cyst, too small to characterize. 7. Shotty nodes in the retroperitoneum are likely reactive given the renal abnormality discussed above. 8. Tiny fat containing umbilical hernia. 9. Possible cellulitis in the left buttocks. Electronically Signed   By: Dorise Bullion III M.D   On: 08/25/2018 08:29   Dg Abd Acute W/chest  Result Date: 08/25/2018 CLINICAL DATA:  Abdominal pain. Constipation. Patient took a laxative and had diarrhea. History of hypertension. Nonsmoker. EXAM: DG ABDOMEN ACUTE W/ 1V CHEST COMPARISON:  Chest 10/08/2010 FINDINGS: Shallow inspiration. Mild cardiac enlargement. Lungs are clear and expanded. No blunting of costophrenic angles. No pneumothorax.  Scattered gas and stool in the colon. Scattered gas-filled small bowel. No small or large bowel dilatation. Some of the small bowel loops suggest wall thickening which could indicate enteritis. No free intra-abdominal air. No abnormal air-fluid levels. No radiopaque stones. Inferior vena caval filter. Postoperative changes at the thoracolumbar junction. Density in the pelvis could represent a large calcified bladder stone or residual contrast material in the bladder. IMPRESSION: Mild cardiac enlargement. No evidence of active pulmonary disease. Nonobstructive bowel gas pattern with scattered gas in the colon and small bowel possibly indicating ileus or enteritis. Electronically Signed   By: Lucienne Capers M.D.   On: 08/25/2018 05:31    Pending Labs Unresulted Labs (From admission, onward)    Start     Ordered   08/25/18 0849  Urine culture  STAT,   STAT     08/25/18 0848          Vitals/Pain Today's Vitals   08/25/18 0715 08/25/18 0717 08/25/18 0814 08/25/18 0830  BP: (!) 147/91  134/76 132/75  Pulse: (!) 101  94 89  Resp: 16  16 16   Temp:      TempSrc:      SpO2: 99%  100% 100%  Weight:      Height:      PainSc:  5       Isolation Precautions No active isolations  Medications Medications  0.9 %  sodium chloride infusion ( Intravenous Rate/Dose Verify 08/25/18 0902)  sodium chloride 0.9 % bolus 1,000 mL (0 mLs Intravenous Stopped 08/25/18 0753)  fentaNYL (SUBLIMAZE) injection 50 mcg (50 mcg Intravenous Given 08/25/18 0651)  iopamidol (ISOVUE-300) 61 % injection 100 mL (100 mLs Intravenous Contrast Given 08/25/18 0726)  cefTRIAXone (ROCEPHIN) 1 g in sodium chloride 0.9 % 100 mL IVPB ( Intravenous Stopped 08/25/18 0900)    Mobility non-ambulatory  (paraplegic- uses own wheelchair)

## 2018-08-25 NOTE — ED Provider Notes (Signed)
[facility] DEPT Provider Note  CSN: [id number] Arrival date & time: 08/25/18 0321  Chief Complaint(s) Abdominal Pain  HPI Andre Clements is a 47 y.o. male the past medical history listed below who presents to the emergency department with several days of abdominal discomfort attributed to constipation.  Pain worsened yesterday.  States that he took some laxatives and had watery bowel movement around midnight.  Pain is in the lower abdomen, aching/cramping in nature.  No alleviating or aggravating factors.  Patient denied any nausea vomiting.  Denies any fevers or chills.  No chest pain or shortness of breath.  Denies any dysuria, but did state "it felt like my constipation went up to my bladder."  HPI  Past Medical History Past Medical History:  Diagnosis Date  . Bipolar 2 disorder ([place])   . Blood in stool   . Depression   . Hyperlipidemia   . Hypertension   . Urine incontinence    Patient Active Problem List   Diagnosis Date Noted  . UTI (urinary tract infection) 02/14/2018  . Impaired fasting blood sugar 09/26/2017  . Routine general medical examination at a health care facility 06/24/2016  . Ventral hernia 05/26/2016  . Pituitary insufficiency ([place]) 03/04/2016  . Hypogonadism male 03/01/2016  . Essential hypertension 09/16/2015  . Hyperlipidemia 09/16/2015  . 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 Mood disorder ([place]) 09/16/2015   Home Medication(s) Prior to Admission medications   Medication Sig Start Date End Date Taking? Authorizing Provider  Ascorbic Acid (VITAMIN C) 250 MG CHEW Chew 500 each by mouth daily.    Yes [provider]  Cholecalciferol (VITAMIN D3) 5000 units CAPS Take 5,000 Units by mouth daily.    Yes [provider]  ferrous sulfate (IRON SUPPLEMENT) 325 (65 FE) MG tablet Take 325 mg by mouth daily.    Yes [provider]  haloperidol (HALDOL) 5 MG tablet Take 1 tablet (5 mg total) by  mouth daily. Patient taking differently: Take 5 mg by mouth every other day.  08/06/18  Yes Hoyt Koch, MD  ibuprofen (ADVIL,MOTRIN) 200 MG tablet Take 400 mg by mouth every 6 (six) hours as needed for moderate pain.   Yes [provider]  lisinopril (PRINIVIL,ZESTRIL) 20 MG tablet TAKE 1 TABLET BY MOUTH  DAILY Patient taking differently: Take 20 mg by mouth daily.  11/22/17  Yes Hoyt Koch, MD  metFORMIN (GLUCOPHAGE-XR) 500 MG 24 hr tablet Take 1 tablet (500 mg total) by mouth daily with breakfast. 07/23/18  Yes Hoyt Koch, MD  Multiple Vitamins-Minerals (CENTRUM VITAMINTS PO) Take 1 tablet by mouth daily.   Yes [provider]  omeprazole (PRILOSEC) 40 MG capsule Take 1 capsule (40 mg total) by mouth daily. 07/23/18  Yes Hoyt Koch, MD  sertraline (ZOLOFT) 100 MG tablet Take 2 tablets (200 mg total) by mouth daily. Two tablets by mouth daily Patient taking differently: Take 200 mg by mouth daily.  07/31/18  Yes Hoyt Koch, MD  simvastatin (ZOCOR) 40 MG tablet Take 1 tablet (40 mg total) by mouth daily at 6 PM. 07/23/18  Yes Hoyt Koch, MD  ciprofloxacin (CIPRO) 500 MG tablet Take 1 tablet (500 mg total) by mouth 2 (two) times daily. Patient not taking: Reported on 08/25/2018 06/11/18   Hoyt Koch, MD  Past Surgical History Past Surgical History:  Procedure Laterality Date  . ABDOMINAL HERNIA REPAIR  2003  . arm surgery Right    from suicide attempt   Family History Family History  Problem Relation Age of Onset  . Alcohol abuse Mother   . Hypertension Mother   . Alcohol abuse Father   . Other Neg Hx        hypogonadism    Social History Social History   Tobacco Use  . Smoking status: Never Smoker  . Smokeless tobacco: Never Used  Substance Use Topics  . Alcohol use: No     Alcohol/week: 0.0 standard drinks  . Drug use: No   Allergies Patient has no known allergies.  Review of Systems Review of Systems All other systems are reviewed and are negative for acute change except as noted in the HPI  Physical Exam Vital Signs  I have reviewed the triage vital signs BP 132/75   Pulse 89   Temp 100 F (37.8 C) (Oral)   Resp 16   Ht 5\' 8"  (1.727 m)   Wt 99.8 kg   SpO2 100%   BMI 33.45 kg/m   Physical Exam  Constitutional: He is oriented to person, place, and time. He appears well-developed and well-nourished. No distress.  HENT:  Head: Normocephalic and atraumatic.  Nose: Nose normal.  Eyes: Pupils are equal, round, and reactive to light. Conjunctivae and EOM are normal. Right eye exhibits no discharge. Left eye exhibits no discharge. No scleral icterus.  Neck: Normal range of motion. Neck supple.  Cardiovascular: Normal rate and regular rhythm. Exam reveals no gallop and no friction rub.  No murmur heard. Pulmonary/Chest: Effort normal and breath sounds normal. No stridor. No respiratory distress. He has no rales.  Abdominal: Soft. He exhibits no distension. There is tenderness in the suprapubic area. There is no rigidity, no rebound and no guarding.  Musculoskeletal: He exhibits no edema or tenderness.  Neurological: He is alert and oriented to person, place, and time.  Skin: Skin is warm and dry. No rash noted. He is not diaphoretic. No erythema.  Psychiatric: He has a normal mood and affect.  Vitals reviewed.   ED Results and Treatments Labs (all labs ordered are listed, but only abnormal results are displayed) Labs Reviewed  URINALYSIS, ROUTINE W REFLEX MICROSCOPIC - Abnormal; Notable for the following components:      Result Value   APPearance CLOUDY (*)    Hgb urine dipstick MODERATE (*)    Protein, ur >=300 (*)    Leukocytes, UA LARGE (*)    RBC / HPF >50 (*)    WBC, UA >50 (*)    Bacteria, UA RARE (*)    Non Squamous Epithelial  0-5 (*)    All other components within normal limits  CBC WITH DIFFERENTIAL/PLATELET - Abnormal; Notable for the following components:   WBC 16.0 (*)    Hemoglobin 11.2 (*)    HCT 34.7 (*)    MCV 63.9 (*)    MCH 20.6 (*)    RDW 16.2 (*)    Neutro Abs 12.4 (*)    Monocytes Absolute 1.8 (*)    All other components within normal limits  COMPREHENSIVE METABOLIC PANEL - Abnormal; Notable for the following components:   Glucose, Bld 112 (*)    Albumin 3.4 (*)    AST 14 (*)    All other components within normal limits  URINE CULTURE  LIPASE, BLOOD  EKG  EKG Interpretation  Date/Time:    Ventricular Rate:    PR Interval:    QRS Duration:   QT Interval:    QTC Calculation:   R Axis:     Text Interpretation:        Radiology Ct Abdomen Pelvis W Contrast  Result Date: 08/25/2018 CLINICAL DATA:  Right-sided pain.  Constipation. EXAM: CT ABDOMEN AND PELVIS WITH CONTRAST TECHNIQUE: Multidetector CT imaging of the abdomen and pelvis was performed using the standard protocol following bolus administration of intravenous contrast. CONTRAST:  115mL ISOVUE-300 IOPAMIDOL (ISOVUE-300) INJECTION 61% COMPARISON:  None. FINDINGS: Lower chest: There is a nodule in the left base on series 7, image 20 measuring 3 mm. Mild scattered atelectasis. No other abnormalities in the lung bases. Hepatobiliary: Hepatic steatosis. The liver, gallbladder, and portal vein are otherwise normal. Pancreas: Unremarkable. No pancreatic ductal dilatation or surrounding inflammatory changes. Spleen: Normal in size without focal abnormality. Adrenals/Urinary Tract: Adrenal glands are normal. Bilateral hydronephrosis and perinephric stranding is identified. A tiny stone seen in the left kidney on series 5, image 76. A few scattered low-attenuation lesions in the kidneys are most consistent with small cysts but  too small to characterize. Minimal excretion into the renal collecting systems on delayed imaging. Marked ureterectasis, right greater than left but bilateral. No ureteral stones are noted. There is a large calcification in the inferior bladder. There is bladder wall thickening and fat stranding adjacent to the bladder. Stomach/Bowel: The stomach and small bowel are unremarkable. The colon and appendix are normal. Vascular/Lymphatic: Shotty nodes in the retroperitoneum are likely reactive. The abdominal aorta is unremarkable. An IVC filter is identified. Reproductive: Prominence of the urethra as it passes the prostate. The prostate is cells otherwise unremarkable. Other: There is a fat containing umbilical hernia. Fat stranding is seen in the left buttocks. No other acute abnormalities. Musculoskeletal: Postoperative changes in the lumbar spine. No acute bony abnormalities are identified. IMPRESSION: 1. Bilateral moderate hydronephrosis, perinephric stranding, and marked ureterectasis. Given the bilateral nature, I suspect the bilateral hydronephrosis may be due to the large bladder stone. 2. The bladder is thick walled with adjacent fat stranding suggesting cystitis. Recommend correlation with urinalysis. 3. Prominence of the urethra as it passes the prostate may be postprocedural. Recommend clinical correlation. 4. 3 mm nodule in the left lung base. No follow-up needed if patient is low-risk. Non-contrast chest CT can be considered in 12 months if patient is high-risk. This recommendation follows the consensus statement: Guidelines for Management of Incidental Pulmonary Nodules Detected on CT Images: From the Fleischner Society 2017; Radiology 2017; 284:228-243. 5. Hepatic steatosis. 6. Nonobstructive stone in the left kidney. Probable small renal cyst, too small to characterize. 7. Shotty nodes in the retroperitoneum are likely reactive given the renal abnormality discussed above. 8. Tiny fat containing  umbilical hernia. 9. Possible cellulitis in the left buttocks. Electronically Signed   By: Dorise Bullion III M.D   On: 08/25/2018 08:29   Dg Abd Acute W/chest  Result Date: 08/25/2018 CLINICAL DATA:  Abdominal pain. Constipation. Patient took a laxative and had diarrhea. History of hypertension. Nonsmoker. EXAM: DG ABDOMEN ACUTE W/ 1V CHEST COMPARISON:  Chest 10/08/2010 FINDINGS: Shallow inspiration. Mild cardiac enlargement. Lungs are clear and expanded. No blunting of costophrenic angles. No pneumothorax. Scattered gas and stool in the colon. Scattered gas-filled small bowel. No small or large bowel dilatation. Some of the small bowel loops suggest wall thickening which could indicate enteritis. No free intra-abdominal air. No abnormal  air-fluid levels. No radiopaque stones. Inferior vena caval filter. Postoperative changes at the thoracolumbar junction. Density in the pelvis could represent a large calcified bladder stone or residual contrast material in the bladder. IMPRESSION: Mild cardiac enlargement. No evidence of active pulmonary disease. Nonobstructive bowel gas pattern with scattered gas in the colon and small bowel possibly indicating ileus or enteritis. Electronically Signed   By: Lucienne Capers M.D.   On: 08/25/2018 05:31   Pertinent labs & imaging results that were available during my care of the patient were reviewed by me and considered in my medical decision making (see chart for details).  Medications Ordered in ED Medications  cefTRIAXone (ROCEPHIN) 1 g in sodium chloride 0.9 % 100 mL IVPB ( Intravenous Rate/Dose Verify 08/25/18 0835)  0.9 %  sodium chloride infusion ( Intravenous Paused 08/25/18 0830)  sodium chloride 0.9 % bolus 1,000 mL (0 mLs Intravenous Stopped 08/25/18 0753)  fentaNYL (SUBLIMAZE) injection 50 mcg (50 mcg Intravenous Given 08/25/18 0651)  iopamidol (ISOVUE-300) 61 % injection 100 mL (100 mLs Intravenous Contrast Given 08/25/18 0726)                                                                                                                                     Procedures Procedures  (including critical care time)  Medical Decision Making / ED Course I have reviewed the nursing notes for this encounter and the patient's prior records (if available in EHR or on provided paperwork).    Lower abd pain and tenderness. Not peritonitic. Labs with leukocytosis. No significant electrolyte derangements or renal insufficiency.  No evidence of biliary obstruction or pancreatitis. UA with hematuria and pyuria with rare bacteria. Plain film revealed ileus versus enteritis.  Not suspicious for bowel obstruction. Given the significant leukocytosis, will obtain a CT scan to assess for any serious intra-abdominal inflammatory/infectious process. Given Rocephin to cover for possible intraabdominal or urinary infection.  CT notable for large bladder stone causing bilateral obstruction.  Case discussed with Dr. Gilford Rile from urology who recommended medicine admission.  Foley placement.  Urology will see patient in consultation during admission for possible lithotripsy.   Final Clinical Impression(s) / ED Diagnoses Final diagnoses:  Abdominal pain  Bladder stone  Urinary obstruction      This chart was dictated using voice recognition software.  Despite best efforts to proofread,  errors can occur which can change the documentation meaning.   Fatima Blank, MD 08/25/18 [phone]

## 2018-08-25 NOTE — ED Notes (Signed)
Pt requesting pain meds

## 2018-08-25 NOTE — ED Triage Notes (Signed)
Patient is complaining of 8/10 abdominal pain believes is due to constipation. He took a laxative and noticed diarrhea about 0000.

## 2018-08-25 NOTE — H&P (Signed)
History and Physical  BRAYLON GRENDA VHQ:[id number] DOB: [birth year] DOA: 08/25/2018  PCP: Hoyt Koch, MD  Patient coming from: [place] with a wheechair  Chief Complaint: Abdominal pain  HPI: Andre Clements is a 47 y.o. male with medical history significant for hypertension, hyperlipidemia, diabetes, GERD, mood disorder, paraplegic, presents to the ED with 2 days of worsening generalized abdominal discomfort, located suprapubic, and right flank, aching in nature, 8/10 in severity, which he attributed to being constipated.  Patient took some laxatives last night, and had a bowel movement, but pain persisted.  Patient denies any nausea/vomiting, fever/chills, dysuria, chest pain, shortness of breath.  Patient has paralysis of both limbs and uses a wheelchair for ambulation.  No previous history of kidney/bladder stone.  Patient is supposed to catheterize himself intermittently, but rarely does it.   ED Course: Patient was noted to be tachycardic, with leukocytosis.  UA with hematuria, greater than 50 WBC, large leukocytes.  CT abdomen/pelvis showed bilateral moderate hydro-nephrosis, perinephric stranding likely due to large bladder stone.  Thickened wall bladder with fat stranding suggesting cystitis.  Urology consulted, medicine asked to admit despite mostly urologic issues.  Patient admitted for further management.  Review of Systems: Review of systems are otherwise negative   Past Medical History:  Diagnosis Date  . Bipolar 2 disorder ([place])   . Blood in stool   . Depression   . Hyperlipidemia   . Hypertension   . Urine incontinence    Past Surgical History:  Procedure Laterality Date  . ABDOMINAL HERNIA REPAIR  2003  . arm surgery Right    from suicide attempt    Social History:  reports that he has never smoked. He has never used smokeless tobacco. He reports that he does not drink alcohol or use drugs.   No Known Allergies  Family History  Problem Relation  Age of Onset  . Alcohol abuse Mother   . Hypertension Mother   . Alcohol abuse Father   . Other Neg Hx        hypogonadism     Prior to Admission medications   Medication Sig Start Date End Date Taking? Authorizing Provider  Ascorbic Acid (VITAMIN C) 250 MG CHEW Chew 500 each by mouth daily.    Yes [provider]  Cholecalciferol (VITAMIN D3) 5000 units CAPS Take 5,000 Units by mouth daily.    Yes [provider]  ferrous sulfate (IRON SUPPLEMENT) 325 (65 FE) MG tablet Take 325 mg by mouth daily.    Yes [provider]  haloperidol (HALDOL) 5 MG tablet Take 1 tablet (5 mg total) by mouth daily. Patient taking differently: Take 5 mg by mouth every other day.  08/06/18  Yes Hoyt Koch, MD  ibuprofen (ADVIL,MOTRIN) 200 MG tablet Take 400 mg by mouth every 6 (six) hours as needed for moderate pain.   Yes [provider]  lisinopril (PRINIVIL,ZESTRIL) 20 MG tablet TAKE 1 TABLET BY MOUTH  DAILY Patient taking differently: Take 20 mg by mouth daily.  11/22/17  Yes Hoyt Koch, MD  metFORMIN (GLUCOPHAGE-XR) 500 MG 24 hr tablet Take 1 tablet (500 mg total) by mouth daily with breakfast. 07/23/18  Yes Hoyt Koch, MD  Multiple Vitamins-Minerals (CENTRUM VITAMINTS PO) Take 1 tablet by mouth daily.   Yes [provider]  omeprazole (PRILOSEC) 40 MG capsule Take 1 capsule (40 mg total) by mouth daily. 07/23/18  Yes Hoyt Koch, MD  sertraline (ZOLOFT) 100 MG tablet  Take 2 tablets (200 mg total) by mouth daily. Two tablets by mouth daily Patient taking differently: Take 200 mg by mouth daily.  07/31/18  Yes Hoyt Koch, MD  simvastatin (ZOCOR) 40 MG tablet Take 1 tablet (40 mg total) by mouth daily at 6 PM. 07/23/18  Yes Hoyt Koch, MD  ciprofloxacin (CIPRO) 500 MG tablet Take 1 tablet (500 mg total) by mouth 2 (two) times daily. Patient not taking: Reported on 08/25/2018 06/11/18   Hoyt Koch, MD     Physical Exam: BP (!) 134/105   Pulse 83   Temp 100 F (37.8 C) (Oral)   Resp 16   Ht 5\' 8"  (1.727 m)   Wt 99.8 kg   SpO2 90%   BMI 33.45 kg/m   General: Mild distress, due to pain Eyes: Normal ENT: Normal Neck: Supple Cardiovascular: S1, S2 present Respiratory: CTA B Abdomen: Soft, obese, TTP around suprapubic and right flank, BS present Skin: Normal Musculoskeletal: No pedal edema bilaterally Psychiatric: Normal mood Neurologic: 0/5 strength in BLE, no other focal neurologic deficit noted          Labs on Admission:  Basic Metabolic Panel: Recent Labs  Lab 08/25/18 0534  NA 142  K 4.0  CL 103  CO2 29  GLUCOSE 112*  BUN 17  CREATININE 1.09  CALCIUM 9.0   Liver Function Tests: Recent Labs  Lab 08/25/18 0534  AST 14*  ALT 14  ALKPHOS 49  BILITOT 0.6  PROT 7.1  ALBUMIN 3.4*   Recent Labs  Lab 08/25/18 0534  LIPASE 28   No results for input(s): AMMONIA in the last 168 hours. CBC: Recent Labs  Lab 08/25/18 0534  WBC 16.0*  NEUTROABS 12.4*  HGB 11.2*  HCT 34.7*  MCV 63.9*  PLT 373   Cardiac Enzymes: No results for input(s): CKTOTAL, CKMB, CKMBINDEX, TROPONINI in the last 168 hours.  BNP (last 3 results) No results for input(s): BNP in the last 8760 hours.  ProBNP (last 3 results) No results for input(s): PROBNP in the last 8760 hours.  CBG: No results for input(s): GLUCAP in the last 168 hours.  Radiological Exams on Admission: Ct Abdomen Pelvis W Contrast  Result Date: 08/25/2018 CLINICAL DATA:  Right-sided pain.  Constipation. EXAM: CT ABDOMEN AND PELVIS WITH CONTRAST TECHNIQUE: Multidetector CT imaging of the abdomen and pelvis was performed using the standard protocol following bolus administration of intravenous contrast. CONTRAST:  140mL ISOVUE-300 IOPAMIDOL (ISOVUE-300) INJECTION 61% COMPARISON:  None. FINDINGS: Lower chest: There is a nodule in the left base on series 7, image 20 measuring 3 mm. Mild scattered atelectasis. No  other abnormalities in the lung bases. Hepatobiliary: Hepatic steatosis. The liver, gallbladder, and portal vein are otherwise normal. Pancreas: Unremarkable. No pancreatic ductal dilatation or surrounding inflammatory changes. Spleen: Normal in size without focal abnormality. Adrenals/Urinary Tract: Adrenal glands are normal. Bilateral hydronephrosis and perinephric stranding is identified. A tiny stone seen in the left kidney on series 5, image 76. A few scattered low-attenuation lesions in the kidneys are most consistent with small cysts but too small to characterize. Minimal excretion into the renal collecting systems on delayed imaging. Marked ureterectasis, right greater than left but bilateral. No ureteral stones are noted. There is a large calcification in the inferior bladder. There is bladder wall thickening and fat stranding adjacent to the bladder. Stomach/Bowel: The stomach and small bowel are unremarkable. The colon and appendix are normal. Vascular/Lymphatic: Shotty nodes in the retroperitoneum are likely reactive. The  abdominal aorta is unremarkable. An IVC filter is identified. Reproductive: Prominence of the urethra as it passes the prostate. The prostate is cells otherwise unremarkable. Other: There is a fat containing umbilical hernia. Fat stranding is seen in the left buttocks. No other acute abnormalities. Musculoskeletal: Postoperative changes in the lumbar spine. No acute bony abnormalities are identified. IMPRESSION: 1. Bilateral moderate hydronephrosis, perinephric stranding, and marked ureterectasis. Given the bilateral nature, I suspect the bilateral hydronephrosis may be due to the large bladder stone. 2. The bladder is thick walled with adjacent fat stranding suggesting cystitis. Recommend correlation with urinalysis. 3. Prominence of the urethra as it passes the prostate may be postprocedural. Recommend clinical correlation. 4. 3 mm nodule in the left lung base. No follow-up needed if  patient is low-risk. Non-contrast chest CT can be considered in 12 months if patient is high-risk. This recommendation follows the consensus statement: Guidelines for Management of Incidental Pulmonary Nodules Detected on CT Images: From the Fleischner Society 2017; Radiology 2017; 284:228-243. 5. Hepatic steatosis. 6. Nonobstructive stone in the left kidney. Probable small renal cyst, too small to characterize. 7. Shotty nodes in the retroperitoneum are likely reactive given the renal abnormality discussed above. 8. Tiny fat containing umbilical hernia. 9. Possible cellulitis in the left buttocks. Electronically Signed   By: Dorise Bullion III M.D   On: 08/25/2018 08:29   Dg Abd Acute W/chest  Result Date: 08/25/2018 CLINICAL DATA:  Abdominal pain. Constipation. Patient took a laxative and had diarrhea. History of hypertension. Nonsmoker. EXAM: DG ABDOMEN ACUTE W/ 1V CHEST COMPARISON:  Chest 10/08/2010 FINDINGS: Shallow inspiration. Mild cardiac enlargement. Lungs are clear and expanded. No blunting of costophrenic angles. No pneumothorax. Scattered gas and stool in the colon. Scattered gas-filled small bowel. No small or large bowel dilatation. Some of the small bowel loops suggest wall thickening which could indicate enteritis. No free intra-abdominal air. No abnormal air-fluid levels. No radiopaque stones. Inferior vena caval filter. Postoperative changes at the thoracolumbar junction. Density in the pelvis could represent a large calcified bladder stone or residual contrast material in the bladder. IMPRESSION: Mild cardiac enlargement. No evidence of active pulmonary disease. Nonobstructive bowel gas pattern with scattered gas in the colon and small bowel possibly indicating ileus or enteritis. Electronically Signed   By: Lucienne Capers M.D.   On: 08/25/2018 05:31    EKG: None  Assessment/Plan Present on Admission: . Urinary bladder stone . Essential hypertension . Hyperlipidemia . Paralysis of  both lower limbs ([place]) . Mood disorder (H[facility])  Principal Problem:   Urinary bladder stone Active Problems:   Essential hypertension   Hyperlipidemia   Paralysis of both lower limbs (H[facility])   Mood disorder (H[facility])  Sepsis likely due to acute cystitis Tachycardia, leukocytosis on admission Currently afebrile, with leukocytosis UA showed hematuria, greater than 50 WBC, large leukocytes UC pending Continue IV Rocephin, pending cultures  Bilateral hydronephrosis 2/2 bladder outlet obstruction 2/2 large bladder stone CT abdomen/pelvis showed bilateral moderate hydro-nephrosis, perinephric stranding likely due to large bladder stone.  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

## 2018-08-25 NOTE — ED Notes (Signed)
Bed: TH43 Expected date:  Expected time:  Means of arrival:  Comments: Diarrhea x 3 days

## 2018-08-26 ENCOUNTER — Observation Stay (HOSPITAL_COMMUNITY): Payer: Medicare Other | Admitting: Anesthesiology

## 2018-08-26 ENCOUNTER — Encounter (HOSPITAL_COMMUNITY): Payer: Self-pay | Admitting: *Deleted

## 2018-08-26 ENCOUNTER — Encounter (HOSPITAL_COMMUNITY)
Admission: EM | Disposition: A | Discharge status: Home Health | Payer: Self-pay | Source: Home / Self Care | Attending: Family Medicine

## 2018-08-26 DIAGNOSIS — E785 Hyperlipidemia, unspecified: Secondary | ICD-10-CM | POA: Diagnosis present

## 2018-08-26 DIAGNOSIS — T148XXS Other injury of unspecified body region, sequela: Secondary | ICD-10-CM | POA: Diagnosis not present

## 2018-08-26 DIAGNOSIS — T410X5A Adverse effect of inhaled anesthetics, initial encounter: Secondary | ICD-10-CM | POA: Diagnosis not present

## 2018-08-26 DIAGNOSIS — I10 Essential (primary) hypertension: Secondary | ICD-10-CM | POA: Diagnosis not present

## 2018-08-26 DIAGNOSIS — E119 Type 2 diabetes mellitus without complications: Secondary | ICD-10-CM | POA: Diagnosis present

## 2018-08-26 DIAGNOSIS — N3000 Acute cystitis without hematuria: Secondary | ICD-10-CM | POA: Diagnosis not present

## 2018-08-26 DIAGNOSIS — F3181 Bipolar II disorder: Secondary | ICD-10-CM | POA: Diagnosis present

## 2018-08-26 DIAGNOSIS — A419 Sepsis, unspecified organism: Secondary | ICD-10-CM | POA: Diagnosis not present

## 2018-08-26 DIAGNOSIS — R109 Unspecified abdominal pain: Secondary | ICD-10-CM | POA: Diagnosis present

## 2018-08-26 DIAGNOSIS — N319 Neuromuscular dysfunction of bladder, unspecified: Secondary | ICD-10-CM | POA: Diagnosis present

## 2018-08-26 DIAGNOSIS — F39 Unspecified mood [affective] disorder: Secondary | ICD-10-CM | POA: Diagnosis not present

## 2018-08-26 DIAGNOSIS — Z7984 Long term (current) use of oral hypoglycemic drugs: Secondary | ICD-10-CM | POA: Diagnosis not present

## 2018-08-26 DIAGNOSIS — N320 Bladder-neck obstruction: Secondary | ICD-10-CM | POA: Diagnosis not present

## 2018-08-26 DIAGNOSIS — R0902 Hypoxemia: Secondary | ICD-10-CM | POA: Diagnosis not present

## 2018-08-26 DIAGNOSIS — N3090 Cystitis, unspecified without hematuria: Secondary | ICD-10-CM

## 2018-08-26 DIAGNOSIS — N210 Calculus in bladder: Secondary | ICD-10-CM | POA: Diagnosis not present

## 2018-08-26 DIAGNOSIS — Z915 Personal history of self-harm: Secondary | ICD-10-CM | POA: Diagnosis not present

## 2018-08-26 DIAGNOSIS — J9601 Acute respiratory failure with hypoxia: Secondary | ICD-10-CM

## 2018-08-26 DIAGNOSIS — J9811 Atelectasis: Secondary | ICD-10-CM | POA: Diagnosis not present

## 2018-08-26 DIAGNOSIS — G8220 Paraplegia, unspecified: Secondary | ICD-10-CM | POA: Diagnosis not present

## 2018-08-26 DIAGNOSIS — E669 Obesity, unspecified: Secondary | ICD-10-CM | POA: Diagnosis present

## 2018-08-26 DIAGNOSIS — N390 Urinary tract infection, site not specified: Secondary | ICD-10-CM | POA: Diagnosis not present

## 2018-08-26 DIAGNOSIS — N39490 Overflow incontinence: Secondary | ICD-10-CM | POA: Diagnosis present

## 2018-08-26 DIAGNOSIS — G8222 Paraplegia, incomplete: Secondary | ICD-10-CM | POA: Diagnosis present

## 2018-08-26 DIAGNOSIS — N209 Urinary calculus, unspecified: Secondary | ICD-10-CM | POA: Diagnosis not present

## 2018-08-26 DIAGNOSIS — D649 Anemia, unspecified: Secondary | ICD-10-CM | POA: Diagnosis present

## 2018-08-26 DIAGNOSIS — Z79899 Other long term (current) drug therapy: Secondary | ICD-10-CM | POA: Diagnosis not present

## 2018-08-26 DIAGNOSIS — Z6833 Body mass index (BMI) 33.0-33.9, adult: Secondary | ICD-10-CM | POA: Diagnosis not present

## 2018-08-26 HISTORY — PX: CYSTOSCOPY WITH LITHOLAPAXY: SHX1425

## 2018-08-26 LAB — BASIC METABOLIC PANEL
Anion gap: 8 (ref 5–15)
BUN: 13 mg/dL (ref 6–20)
CALCIUM: 8.2 mg/dL — AB (ref 8.9–10.3)
CO2: 30 mmol/L (ref 22–32)
CREATININE: 1.02 mg/dL (ref 0.61–1.24)
Chloride: 107 mmol/L (ref 98–111)
GFR calc non Af Amer: >60 mL/min (ref >60)
Glucose, Bld: 96 mg/dL (ref 70–99)
Potassium: 4.4 mmol/L (ref 3.5–5.1)
SODIUM: 145 mmol/L (ref 135–145)

## 2018-08-26 LAB — CBC
HCT: 30.3 % — ABNORMAL LOW (ref 39.0–52.0)
Hemoglobin: 9.6 g/dL — ABNORMAL LOW (ref 13.0–17.0)
MCH: 20.6 pg — ABNORMAL LOW (ref 26.0–34.0)
MCHC: 31.7 g/dL (ref 30.0–36.0)
MCV: 65.2 fL — ABNORMAL LOW (ref 78.0–100.0)
Platelets: 307 10*3/uL (ref 150–400)
RBC: 4.65 MIL/uL (ref 4.22–5.81)
RDW: 16.6 % — AB (ref 11.5–15.5)
WBC: 12.2 10*3/uL — AB (ref 4.0–10.5)

## 2018-08-26 LAB — URINE CULTURE

## 2018-08-26 LAB — GLUCOSE, CAPILLARY
GLUCOSE-CAPILLARY: 84 mg/dL (ref 70–99)
Glucose-Capillary: 107 mg/dL — ABNORMAL HIGH (ref 70–99)
Glucose-Capillary: 137 mg/dL — ABNORMAL HIGH (ref 70–99)
Glucose-Capillary: 143 mg/dL — ABNORMAL HIGH (ref 70–99)

## 2018-08-26 LAB — HIV ANTIBODY (ROUTINE TESTING W REFLEX): HIV SCREEN 4TH GENERATION: NONREACTIVE

## 2018-08-26 SURGERY — CYSTOSCOPY, WITH BLADDER CALCULUS LITHOLAPAXY
Anesthesia: General | Site: Bladder

## 2018-08-26 MED ORDER — DEXAMETHASONE SODIUM PHOSPHATE 10 MG/ML IJ SOLN
INTRAMUSCULAR | Status: AC
  Filled 2018-08-26: qty 1

## 2018-08-26 MED ORDER — ROCURONIUM BROMIDE 10 MG/ML (PF) SYRINGE
PREFILLED_SYRINGE | INTRAVENOUS | Status: DC | PRN
  Administered 2018-08-26: 40 mg via INTRAVENOUS

## 2018-08-26 MED ORDER — LIDOCAINE 2% (20 MG/ML) 5 ML SYRINGE
INTRAMUSCULAR | Status: AC
  Filled 2018-08-26: qty 5

## 2018-08-26 MED ORDER — LIDOCAINE 2% (20 MG/ML) 5 ML SYRINGE
INTRAMUSCULAR | Status: DC | PRN
  Administered 2018-08-26: 60 mg via INTRAVENOUS

## 2018-08-26 MED ORDER — PROPOFOL 10 MG/ML IV BOLUS
INTRAVENOUS | Status: DC | PRN
  Administered 2018-08-26: 170 mg via INTRAVENOUS

## 2018-08-26 MED ORDER — SODIUM CHLORIDE 0.9 % IR SOLN
Status: DC | PRN
  Administered 2018-08-26 (×6): 6000 mL
  Administered 2018-08-26: 24000 mL
  Administered 2018-08-26 (×3): 6000 mL

## 2018-08-26 MED ORDER — FENTANYL CITRATE (PF) 100 MCG/2ML IJ SOLN
INTRAMUSCULAR | Status: AC
  Filled 2018-08-26: qty 2

## 2018-08-26 MED ORDER — PROPOFOL 10 MG/ML IV BOLUS
INTRAVENOUS | Status: AC
  Filled 2018-08-26: qty 40

## 2018-08-26 MED ORDER — SODIUM CHLORIDE 0.9 % IR SOLN
Status: DC | PRN
  Administered 2018-08-26: 1000 mL

## 2018-08-26 MED ORDER — PHENYLEPHRINE 40 MCG/ML (10ML) SYRINGE FOR IV PUSH (FOR BLOOD PRESSURE SUPPORT)
PREFILLED_SYRINGE | INTRAVENOUS | Status: DC | PRN
  Administered 2018-08-26 (×2): 80 ug via INTRAVENOUS

## 2018-08-26 MED ORDER — BELLADONNA ALKALOIDS-OPIUM 16.2-30 MG RE SUPP
RECTAL | Status: AC
  Filled 2018-08-26: qty 1

## 2018-08-26 MED ORDER — FENTANYL CITRATE (PF) 100 MCG/2ML IJ SOLN
INTRAMUSCULAR | Status: AC
  Administered 2018-08-26: 15:00:00
  Filled 2018-08-26: qty 2

## 2018-08-26 MED ORDER — FENTANYL CITRATE (PF) 100 MCG/2ML IJ SOLN
25.0000 ug | INTRAMUSCULAR | Status: DC | PRN
  Administered 2018-08-26: 50 ug via INTRAVENOUS
  Administered 2018-08-26: 25 ug via INTRAVENOUS

## 2018-08-26 MED ORDER — SUGAMMADEX SODIUM 200 MG/2ML IV SOLN
INTRAVENOUS | Status: AC
  Filled 2018-08-26: qty 2

## 2018-08-26 MED ORDER — OXYCODONE HCL 5 MG/5ML PO SOLN
5.0000 mg | Freq: Once | ORAL | Status: DC | PRN
  Filled 2018-08-26: qty 5

## 2018-08-26 MED ORDER — BELLADONNA ALKALOIDS-OPIUM 16.2-60 MG RE SUPP
RECTAL | Status: DC | PRN
  Administered 2018-08-26: 1 via RECTAL

## 2018-08-26 MED ORDER — FENTANYL CITRATE (PF) 250 MCG/5ML IJ SOLN
INTRAMUSCULAR | Status: DC | PRN
  Administered 2018-08-26 (×2): 50 ug via INTRAVENOUS
  Administered 2018-08-26: 100 ug via INTRAVENOUS

## 2018-08-26 MED ORDER — SODIUM CHLORIDE 0.9 % IR SOLN
3000.0000 mL | Status: DC
  Administered 2018-08-26: 3000 mL

## 2018-08-26 MED ORDER — LACTATED RINGERS IV SOLN
INTRAVENOUS | Status: DC | PRN
  Administered 2018-08-26 (×2): via INTRAVENOUS

## 2018-08-26 MED ORDER — OXYCODONE HCL 5 MG PO TABS: 5.0000 mg | ORAL_TABLET | Freq: Once | ORAL | Status: DC | PRN

## 2018-08-26 MED ORDER — DEXAMETHASONE SODIUM PHOSPHATE 10 MG/ML IJ SOLN
INTRAMUSCULAR | Status: DC | PRN
  Administered 2018-08-26: 10 mg via INTRAVENOUS

## 2018-08-26 MED ORDER — ONDANSETRON HCL 4 MG/2ML IJ SOLN
INTRAMUSCULAR | Status: DC | PRN
  Administered 2018-08-26: 4 mg via INTRAVENOUS

## 2018-08-26 MED ORDER — MIDAZOLAM HCL 5 MG/5ML IJ SOLN
INTRAMUSCULAR | Status: DC | PRN
  Administered 2018-08-26: 2 mg via INTRAVENOUS

## 2018-08-26 MED ORDER — SUCCINYLCHOLINE CHLORIDE 200 MG/10ML IV SOSY
PREFILLED_SYRINGE | INTRAVENOUS | Status: DC | PRN
  Administered 2018-08-26: 100 mg via INTRAVENOUS

## 2018-08-26 MED ORDER — ONDANSETRON HCL 4 MG/2ML IJ SOLN: 4.0000 mg | Freq: Four times a day (QID) | INTRAMUSCULAR | Status: DC | PRN

## 2018-08-26 MED ORDER — ROCURONIUM BROMIDE 10 MG/ML (PF) SYRINGE
PREFILLED_SYRINGE | INTRAVENOUS | Status: AC
  Filled 2018-08-26: qty 10

## 2018-08-26 MED ORDER — MIDAZOLAM HCL 2 MG/2ML IJ SOLN
INTRAMUSCULAR | Status: AC
  Filled 2018-08-26: qty 2

## 2018-08-26 MED ORDER — SUCCINYLCHOLINE CHLORIDE 200 MG/10ML IV SOSY
PREFILLED_SYRINGE | INTRAVENOUS | Status: AC
  Filled 2018-08-26: qty 10

## 2018-08-26 MED ORDER — SUGAMMADEX SODIUM 200 MG/2ML IV SOLN
INTRAVENOUS | Status: DC | PRN
  Administered 2018-08-26: 200 mg via INTRAVENOUS

## 2018-08-26 MED ORDER — ONDANSETRON HCL 4 MG/2ML IJ SOLN
INTRAMUSCULAR | Status: AC
  Filled 2018-08-26: qty 2

## 2018-08-26 SURGICAL SUPPLY — 19 items
BAG URINE DRAINAGE (UROLOGICAL SUPPLIES) ×3 IMPLANT
BAG URO CATCHER STRL LF (MISCELLANEOUS) ×4 IMPLANT
CATH FOLEY 3WAY 30CC 22FR (CATHETERS) ×3 IMPLANT
CATH URET 5FR 28IN OPEN ENDED (CATHETERS) ×4 IMPLANT
CLOTH BEACON ORANGE TIMEOUT ST (SAFETY) ×4 IMPLANT
FIBER LASER FLEXIVA 1000 (UROLOGICAL SUPPLIES) ×7 IMPLANT
FIBER LASER FLEXIVA 365 (UROLOGICAL SUPPLIES) ×3 IMPLANT
FIBER LASER FLEXIVA 550 (UROLOGICAL SUPPLIES) ×1 IMPLANT
GLOVE BIOGEL M STRL SZ7.5 (GLOVE) ×4 IMPLANT
GOWN STRL REUS W/TWL LRG LVL3 (GOWN DISPOSABLE) ×8 IMPLANT
GUIDEWIRE STR DUAL SENSOR (WIRE) ×4 IMPLANT
KIT PROBE TRILOGY 3.9X350 (MISCELLANEOUS) ×6 IMPLANT
LOOP CUT BIPOLAR 24F LRG (ELECTROSURGICAL) ×3 IMPLANT
MANIFOLD NEPTUNE II (INSTRUMENTS) ×4 IMPLANT
PACK CYSTO (CUSTOM PROCEDURE TRAY) ×4 IMPLANT
SYRINGE IRR TOOMEY STRL 70CC (SYRINGE) IMPLANT
TUBING CONNECTING 10 (TUBING) ×3 IMPLANT
TUBING CONNECTING 10' (TUBING) ×1
swiss lithoclast trilogy ×6 IMPLANT

## 2018-08-26 NOTE — Anesthesia Preprocedure Evaluation (Signed)
Anesthesia Evaluation  Patient identified by MRN, date of birth, ID band Patient awake    Reviewed: Allergy & Precautions, H&P , NPO status , Patient's Chart, lab work & pertinent test results  Airway Mallampati: II   Neck ROM: full    Dental   Pulmonary neg pulmonary ROS,    breath sounds clear to auscultation       Cardiovascular hypertension,  Rhythm:regular Rate:Normal     Neuro/Psych PSYCHIATRIC DISORDERS Depression Bipolar Disorder    GI/Hepatic   Endo/Other  obese  Renal/GU stones     Musculoskeletal   Abdominal   Peds  Hematology  (+) anemia ,   Anesthesia Other Findings   Reproductive/Obstetrics                             Anesthesia Physical Anesthesia Plan  ASA: II  Anesthesia Plan: General   Post-op Pain Management:    Induction: Intravenous  PONV Risk Score and Plan: 2 and Ondansetron, Dexamethasone, Midazolam and Treatment may vary due to age or medical condition  Airway Management Planned: LMA  Additional Equipment:   Intra-op Plan:   Post-operative Plan:   Informed Consent: I have reviewed the patients History and Physical, chart, labs and discussed the procedure including the risks, benefits and alternatives for the proposed anesthesia with the patient or authorized representative who has indicated his/her understanding and acceptance.     Plan Discussed with: CRNA, Anesthesiologist and Surgeon  Anesthesia Plan Comments:         Anesthesia Quick Evaluation

## 2018-08-26 NOTE — Anesthesia Postprocedure Evaluation (Signed)
Anesthesia Post Note  Patient: Andre Clements  Procedure(s) Performed: CYSTOSCOPY WITH LITHOLAPAXY (N/A Bladder)     Patient location during evaluation: PACU Anesthesia Type: General Level of consciousness: awake and alert Pain management: pain level controlled Vital Signs Assessment: post-procedure vital signs reviewed and stable Respiratory status: spontaneous breathing, nonlabored ventilation, respiratory function stable and patient connected to nasal cannula oxygen Cardiovascular status: blood pressure returned to baseline and stable Postop Assessment: no apparent nausea or vomiting Anesthetic complications: no    Last Vitals:  Vitals:   08/26/18 1500 08/26/18 1522  BP: 129/70 131/71  Pulse: 87 78  Resp: (!) 24 (!) 22  Temp: (!) 36.2 C 37 C  SpO2: (!) 88% 93%    Last Pain:  Vitals:   08/26/18 1616  TempSrc:   PainSc: [place]

## 2018-08-26 NOTE — Op Note (Signed)
Operative Note  Preoperative diagnosis:  1.  6 cm bladder stone 2.  Bladder outlet obstruction 3.  UTI  Postoperative diagnosis: 1.  6 cm bladder stone 2.  Bladder outlet obstruction 3.  UTI  Procedure(s): 1.  Cystolithalopaxy   Surgeon: Ellison Hughs, MD  Assistants:  None  Anesthesia:  General  Complications:  None  EBL:  20 mL   Specimens: 1. Bladder stone  Drains/Catheters: 1.  22 French 3-way Foley with 30 mL in the balloon  Intraoperative findings:   1. 6 cm bladder stone  Indication:  Andre Clements is a 47 y.o. male with a 6 cm bladder stone.  The patient has a history of a neurogenic bladder following a spinal cord injury during a suicide attempt several years ago that resulted in lower extremity paraplegia.  He presented with bladder outlet obstruction and bilateral hydronephrosis due to his very large bladder stone.  He has been consented for the above procedures, voices understanding and wishes to proceed.  Description of procedure:  After informed consent was obtained, the patient was brought to the operating room and general LMA anesthesia was administered. The patient was then placed in the dorsolithotomy position and prepped and draped in usual sterile fashion. A timeout was performed. A 23 French rigid cystoscope was then inserted into the urethral meatus and advanced into the bladder under direct vision. A complete bladder survey revealed no intravesical pathology.  The rigid cystoscope was then exchanged for a 27 French nephroscope.  The ultrasonic lithotripter was then used to fracture the outer edges of the stone, but due to malfunction of the ultrasonic lithotripter that approach had to be abandoned.  The nephroscope was removed and exchanged for a 26 French rigid scope with a laser bridge.  1000 white laser was then used to systematically fracture the stone until it was fragmented enough to evacuate all the fragments from the lumen of the bladder.   Once all stone fragments were removed from the lumen of the bladder there were several areas of mucosal bleeding, but no evidence of bladder perforation.  A bipolar loop working element was then used to fulgurate all mucosal bleeders until hemostasis was achieved.  A 22 French three-way Foley catheter was then inserted with 30 mL in the balloon.  The catheter was placed to gravity drainage and started on continuous bladder irrigation.  The patient tolerated the procedure well and was transferred to the postanesthesia in stable condition.  Plan: Continuous bladder irrigation overnight.  Continue IV antibiotics with Rocephin.  He will need to keep his Foley catheter in 1 week.  I will arrange outpatient follow-up for catheter removal.

## 2018-08-26 NOTE — Anesthesia Procedure Notes (Signed)
Procedure Name: Intubation Date/Time: 08/26/2018 10:02 AM Performed by: Talbot Grumbling, CRNA Pre-anesthesia Checklist: Patient identified, Emergency Drugs available, Suction available and Patient being monitored Patient Re-evaluated:Patient Re-evaluated prior to induction Oxygen Delivery Method: Circle system utilized Preoxygenation: Pre-oxygenation with 100% oxygen Induction Type: IV induction Ventilation: Mask ventilation without difficulty Laryngoscope Size: Mac and 3 Grade View: Grade I Tube type: Oral Tube size: 7.5 mm Number of attempts: 1 Airway Equipment and Method: Stylet Placement Confirmation: ETT inserted through vocal cords under direct vision,  positive ETCO2 and breath sounds checked- equal and bilateral Secured at: 23 cm Tube secured with: Tape Dental Injury: Teeth and Oropharynx as per pre-operative assessment

## 2018-08-26 NOTE — Progress Notes (Signed)
PROGRESS NOTE [facility]   Andre Clements   PRF:[id number] DOB: [birth year]  DOA: 08/25/2018 PCP: Hoyt Koch, MD   Brief Narrative:  Andre Clements is a 47 year old male with medical history significant for hypertension, hyperlipidemia, diabetes mellitus type 2, GERD, mood disorders and paraplegia who presented to the emergency department complaining of generalized abdominal pain and discomfort, radiating to right flank area and suprapubic pressure.  ED evaluation patient was noted to be tachycardic, with leukocytosis.  UA reveals hematuria with large leukocytes and increasing WBC.  CT of the abdomen/pelvis shows bilateral moderate hydronephrosis with perinephric stranding likely due to large bladder stone.  Thickening gallbladder with fat stranding suggesting cystitis.  Patient was admitted with working diagnosis of sepsis secondary to cystitis and bladder stone.  Subjective: Patient seen and examined, he is status post cystolithalopaxy. C/o right flank pain and right flank pain.  Assessment & Plan: Sepsis due to acute cystitis UA grossly abnormal, tachycardia and leukocytosis, place it on empiric antibiotics.  Urine culture shows multiple species.  Will continue IV Rocephin for now.  Sepsis physiology improved.  Bilateral hydronephrosis secondary to bladder outlet obstruction from large bladder stone Urology was consulted and patient underwent cystolithalopaxy.  Patient was placed on CBI, continue management per urology  Overflow incontinence/bladder plasm Continue oxybutynin and pain management as needed  Hypoxia  Likely atelectasis due to prolong anesthesia. Incentive spirometry    Hypertension BP stable, lisinopril on hold given hydronephrosis.  Continue to monitor.  If renal function stable in a.m. okay to resume lisinopril.  Diabetes mellitus type 2 Last A1c 5.6, will control Continue sliding scale and monitor CBGs Holding metformin for now.  Mood  disorder Denies SI/HI Continue Haldol and Zoloft  Paraplegic Stable  DVT prophylaxis: SCD's  Code Status: Full Code  Family Communication: Aunt at bedside  Disposition Plan: Home when cleared by urology   Consultants:   Urology  Procedures:  cystolithalopaxy  Antimicrobials:  Rocephin 9/7 -    Objective: Vitals:   08/26/18 1440 08/26/18 1445 08/26/18 1500 08/26/18 1522  BP: 139/78 (!) 134/93 129/70 131/71  Pulse: 80 87 87 78  Resp: 18 (!) 22 (!) 24 (!) 22  Temp:   (!) 97.1 F (36.2 C) 98.6 F (37 C)  TempSrc:    Oral  SpO2: 95% 94% (!) 88% 93%  Weight:      Height:        Intake/Output Summary (Last 24 hours) at 08/26/2018 1648 Last data filed at 08/26/2018 1608 Gross per 24 hour  Intake 3867.68 ml  Output 3980 ml  Net -112.32 ml   Filed Weights   08/25/18 0351 08/25/18 1035  Weight: 99.8 kg 98.1 kg    Examination:  General exam: NAD Respiratory system: Breath sounds slight diminish at the bases, no wheezing or crackles  Cardiovascular system: S1 & S2 heard, RRR. No JVD, murmurs, rubs or gallops Gastrointestinal system: Abdomen soft, mild tenderness suprapubic and r flank  Genitourinary: CBI in place, urine pink  Central nervous system: Alert and oriented. Extremities: No pedal edema.  Skin: No rashes, lesions or ulcers Psychiatry: Mood & affect appropriate.    Data Reviewed: I have personally reviewed following labs and imaging studies  CBC: Recent Labs  Lab 08/25/18 0534 08/26/18 0511  WBC 16.0* 12.2*  NEUTROABS 12.4*  --   HGB 11.2* 9.6*  HCT 34.7* 30.3*  MCV 63.9* 65.2*  PLT 373 935   Basic Metabolic Panel: Recent Labs  Lab 08/25/18 0534 08/26/18 0511  NA 142 145  K 4.0 4.4  CL 103 107  CO2 29 30  GLUCOSE 112* 96  BUN 17 13  CREATININE 1.09 1.02  CALCIUM 9.0 8.2*   GFR: Estimated Creatinine Clearance: 103.9 mL/min (by C-G formula based on SCr of 1.02 mg/dL). Liver Function Tests: Recent Labs  Lab 08/25/18 0534  AST 14*   ALT 14  ALKPHOS 49  BILITOT 0.6  PROT 7.1  ALBUMIN 3.4*   Recent Labs  Lab 08/25/18 0534  LIPASE 28   No results for input(s): AMMONIA in the last 168 hours. Coagulation Profile: No results for input(s): INR, PROTIME in the last 168 hours. Cardiac Enzymes: No results for input(s): CKTOTAL, CKMB, CKMBINDEX, TROPONINI in the last 168 hours. BNP (last 3 results) No results for input(s): PROBNP in the last 8760 hours. HbA1C: No results for input(s): HGBA1C in the last 72 hours. CBG: Recent Labs  Lab 08/25/18 1805 08/25/18 2116 08/26/18 0727 08/26/18 1428 08/26/18 1621  GLUCAP 76 143* 84 107* 137*   Lipid Profile: No results for input(s): CHOL, HDL, LDLCALC, TRIG, CHOLHDL, LDLDIRECT in the last 72 hours. Thyroid Function Tests: No results for input(s): TSH, T4TOTAL, FREET4, T3FREE, THYROIDAB in the last 72 hours. Anemia Panel: No results for input(s): VITAMINB12, FOLATE, FERRITIN, TIBC, IRON, RETICCTPCT in the last 72 hours. Sepsis Labs: No results for input(s): PROCALCITON, LATICACIDVEN in the last 168 hours.  Recent Results (from the past 240 hour(s))  Urine culture     Status: Abnormal   Collection Time: 08/25/18  5:42 AM  Result Value Ref Range Status   Specimen Description   Final    URINE, CATHETERIZED Performed at [facility] [address] [place], [place] [address]    Special Requests   Final    NONE Performed at [facility], [place] [address] [place], [place] [address]    Culture MULTIPLE SPECIES PRESENT, SUGGEST RECOLLECTION (A)  Final   Report Status 08/26/2018 FINAL  Final  Culture, blood (routine x 2)     Status: None (Preliminary result)   Collection Time: 08/25/18 11:49 AM  Result Value Ref Range Status   Specimen Description   Final    BLOOD RIGHT ANTECUBITAL Performed at [facility] [address], [place], [place] [address]    Special Requests   Final    BOTTLES DRAWN AEROBIC ONLY  Blood Culture adequate volume Performed at [facility] [address], [place], [place] [address]    Culture   Final    NO GROWTH < 24 HOURS Performed at [facility] [address], [place], [place] [address]    Report Status PENDING  Incomplete  Culture, blood (routine x 2)     Status: None (Preliminary result)   Collection Time: 08/25/18 11:52 AM  Result Value Ref Range Status   Specimen Description   Final    BLOOD RIGHT HAND Performed at [facility] [address] [place], [place] [address]    Special Requests   Final    BOTTLES DRAWN AEROBIC ONLY Blood Culture adequate volume Performed at [facility] [address] [place], [place] [address]    Culture   Final    NO GROWTH < 24 HOURS Performed at [facility] [address], [place], [place] [address]    Report Status PENDING  Incomplete  Surgical PCR screen     Status: None   Collection Time: 08/25/18  6:17 PM  Result Value Ref Range Status   MRSA, PCR NEGATIVE NEGATIVE Final   Staphylococcus aureus NEGATIVE NEGATIVE Final    Comment: (NOTE) The Xpert SA Assay (FDA approved for NASAL specimens in patients 70 years of age and older), is one component of a comprehensive surveillance program. It is not intended to diagnose infection nor to guide or monitor treatment. Performed at [facility], [place] [address], [place], [place] [address]       Radiology Studies: Ct Abdomen Pelvis W Contrast  Result Date: 08/25/2018 CLINICAL DATA:  Right-sided pain.  Constipation. EXAM: CT ABDOMEN AND PELVIS WITH CONTRAST TECHNIQUE: Multidetector CT imaging of the abdomen and pelvis was performed using the standard protocol following bolus administration of intravenous contrast. CONTRAST:  160mL ISOVUE-300 IOPAMIDOL (ISOVUE-300) INJECTION 61% COMPARISON:  None. FINDINGS: Lower chest: There is a nodule in the left base on series 7, image 20  measuring 3 mm. Mild scattered atelectasis. No other abnormalities in the lung bases. Hepatobiliary: Hepatic steatosis. The liver, gallbladder, and portal vein are otherwise normal. Pancreas: Unremarkable. No pancreatic ductal dilatation or surrounding inflammatory changes. Spleen: Normal in size without focal abnormality. Adrenals/Urinary Tract: Adrenal glands are normal. Bilateral hydronephrosis and perinephric stranding is identified. A tiny stone seen in the left kidney on series 5, image 76. A few scattered low-attenuation lesions in the kidneys are most consistent with small cysts but too small to characterize. Minimal excretion into the renal collecting systems on delayed imaging. Marked ureterectasis, right greater than left but bilateral. No ureteral stones are noted. There is a large calcification in the inferior bladder. There is bladder wall thickening and fat stranding adjacent to the bladder. Stomach/Bowel: The stomach and small bowel are unremarkable. The colon and appendix are normal. Vascular/Lymphatic: Shotty nodes in the retroperitoneum are likely reactive. The abdominal aorta is unremarkable. An IVC filter is identified. Reproductive: Prominence of the urethra as it passes the prostate. The prostate is cells otherwise unremarkable. Other: There is a fat containing umbilical hernia. Fat stranding is seen in the left buttocks. No other acute abnormalities. Musculoskeletal: Postoperative changes in the lumbar spine. No acute bony abnormalities are identified. IMPRESSION: 1. Bilateral moderate hydronephrosis, perinephric stranding, and marked ureterectasis. Given the bilateral nature, I suspect the bilateral hydronephrosis may be due to the large bladder stone. 2. The bladder is thick walled with adjacent fat stranding suggesting cystitis. Recommend correlation with urinalysis. 3. Prominence of the urethra as it passes the prostate may be postprocedural. Recommend clinical correlation. 4. 3 mm nodule  in the left lung base. No follow-up needed if patient is low-risk. Non-contrast chest CT can be considered in 12 months if patient is high-risk. This recommendation follows the consensus statement: Guidelines for Management of Incidental Pulmonary Nodules Detected on CT Images: From the Fleischner Society 2017; Radiology 2017; 284:228-243. 5. Hepatic steatosis. 6. Nonobstructive stone in the left kidney. Probable small renal cyst, too small to characterize. 7. Shotty nodes in the retroperitoneum are likely reactive given the renal abnormality discussed above. 8. Tiny fat containing umbilical hernia. 9. Possible cellulitis in the left buttocks. Electronically Signed   By: Dorise Bullion III M.D   On: 08/25/2018 08:29   Dg Abd Acute W/chest  Result Date: 08/25/2018 CLINICAL DATA:  Abdominal pain. Constipation. Patient took a laxative and had diarrhea. History of hypertension. Nonsmoker. EXAM: DG ABDOMEN ACUTE W/ 1V CHEST COMPARISON:  Chest 10/08/2010 FINDINGS: Shallow inspiration. Mild cardiac enlargement. Lungs are clear and expanded. No blunting of costophrenic angles.  No pneumothorax. Scattered gas and stool in the colon. Scattered gas-filled small bowel. No small or large bowel dilatation. Some of the small bowel loops suggest wall thickening which could indicate enteritis. No free intra-abdominal air. No abnormal air-fluid levels. No radiopaque stones. Inferior vena caval filter. Postoperative changes at the thoracolumbar junction. Density in the pelvis could represent a large calcified bladder stone or residual contrast material in the bladder. IMPRESSION: Mild cardiac enlargement. No evidence of active pulmonary disease. Nonobstructive bowel gas pattern with scattered gas in the colon and small bowel possibly indicating ileus or enteritis. Electronically Signed   By: Lucienne Capers M.D.   On: 08/25/2018 05:31      Scheduled Meds: . ferrous sulfate  325 mg Oral Daily  . haloperidol  5 mg Oral QODAY   . insulin aspart  0-9 Units Subcutaneous TID WC  . mupirocin ointment  1 application Nasal BID  . pantoprazole  40 mg Oral Daily  . senna  1 tablet Oral BID  . sertraline  200 mg Oral QPM  . simvastatin  40 mg Oral q1800  . sodium chloride flush  3 mL Intravenous Q12H   Continuous Infusions: . sodium chloride 100 mL/hr at 08/26/18 1530  . sodium chloride    . cefTRIAXone (ROCEPHIN)  IV 1 g (08/26/18 0724)  . sodium chloride irrigation       LOS: 0 days    Time spent: Total of 25 minutes spent with pt, greater than 50% of which was spent in discussion of  treatment, counseling and coordination of care   Chipper Oman, MD Pager: Text Page via [email or web address]   If 7PM-7AM, please contact night-coverage [email or web address] 08/26/2018, 4:48 PM   Note - This record has been created using [employer]. Chart creation errors have been sought, but may not always have been located. Such creation errors do not reflect on the standard of medical care.

## 2018-08-26 NOTE — Transfer of Care (Signed)
Immediate Anesthesia Transfer of Care Note  Patient: Andre Clements  Procedure(s) Performed: CYSTOSCOPY WITH LITHOLAPAXY (N/A Bladder)  Patient Location: PACU  Anesthesia Type:General  Level of Consciousness: sedated  Airway & Oxygen Therapy: Patient Spontanous Breathing and Patient connected to face mask oxygen  Post-op Assessment: Report given to RN and Post -op Vital signs reviewed and stable  Post vital signs: Reviewed and stable  Last Vitals:  Vitals Value Taken Time  BP    Temp    Pulse 93 08/26/2018  2:07 PM  Resp 16 08/26/2018  2:07 PM  SpO2 99 % 08/26/2018  2:07 PM  Vitals shown include unvalidated device data.  Last Pain:  Vitals:   08/26/18 0731  TempSrc:   PainSc: Asleep      Patients Stated Pain Goal: 2 (97/84/78 4128)  Complications: No apparent anesthesia complications

## 2018-08-27 ENCOUNTER — Encounter (HOSPITAL_COMMUNITY): Payer: Self-pay | Admitting: Urology

## 2018-08-27 LAB — CBC WITH DIFFERENTIAL/PLATELET
BASOS PCT: 0 %
Basophils Absolute: 0 10*3/uL (ref 0.0–0.1)
EOS ABS: 0.1 10*3/uL (ref 0.0–0.7)
EOS PCT: 0 %
HCT: 29.3 % — ABNORMAL LOW (ref 39.0–52.0)
Hemoglobin: 9.1 g/dL — ABNORMAL LOW (ref 13.0–17.0)
Lymphocytes Relative: 13 %
Lymphs Abs: 1.8 10*3/uL (ref 0.7–4.0)
MCH: 20.4 pg — ABNORMAL LOW (ref 26.0–34.0)
MCHC: 31.1 g/dL (ref 30.0–36.0)
MCV: 65.8 fL — AB (ref 78.0–100.0)
MONOS PCT: 8 %
Monocytes Absolute: 1.1 10*3/uL — ABNORMAL HIGH (ref 0.1–1.0)
NEUTROS PCT: 79 %
Neutro Abs: 10.7 10*3/uL — ABNORMAL HIGH (ref 1.7–7.7)
PLATELETS: 329 10*3/uL (ref 150–400)
RBC: 4.45 MIL/uL (ref 4.22–5.81)
RDW: 17 % — AB (ref 11.5–15.5)
WBC: 13.6 10*3/uL — ABNORMAL HIGH (ref 4.0–10.5)

## 2018-08-27 LAB — BASIC METABOLIC PANEL
Anion gap: 8 (ref 5–15)
BUN: 15 mg/dL (ref 6–20)
CALCIUM: 8 mg/dL — AB (ref 8.9–10.3)
CO2: 28 mmol/L (ref 22–32)
CREATININE: 1.17 mg/dL (ref 0.61–1.24)
Chloride: 109 mmol/L (ref 98–111)
GFR calc non Af Amer: >60 mL/min (ref >60)
Glucose, Bld: 124 mg/dL — ABNORMAL HIGH (ref 70–99)
Potassium: 4.7 mmol/L (ref 3.5–5.1)
SODIUM: 145 mmol/L (ref 135–145)

## 2018-08-27 LAB — GLUCOSE, CAPILLARY
Glucose-Capillary: 127 mg/dL — ABNORMAL HIGH (ref 70–99)
Glucose-Capillary: 100 mg/dL — ABNORMAL HIGH (ref 70–99)

## 2018-08-27 LAB — MAGNESIUM: Magnesium: 2.5 mg/dL — ABNORMAL HIGH (ref 1.7–2.4)

## 2018-08-27 MED ORDER — OXYBUTYNIN CHLORIDE 5 MG PO TABS: 5.0000 mg | ORAL_TABLET | Freq: Three times a day (TID) | ORAL | 1 refills | Status: DC | PRN

## 2018-08-27 MED ORDER — ALUM & MAG HYDROXIDE-SIMETH 200-200-20 MG/5ML PO SUSP
30.0000 mL | ORAL | Status: DC | PRN
  Administered 2018-08-27: 30 mL via ORAL
  Filled 2018-08-27: qty 30

## 2018-08-27 MED ORDER — CEFDINIR 300 MG PO CAPS: 300.0000 mg | ORAL_CAPSULE | Freq: Two times a day (BID) | ORAL | 0 refills | Status: DC

## 2018-08-27 MED ORDER — CEFDINIR 300 MG PO CAPS: 300.0000 mg | ORAL_CAPSULE | Freq: Two times a day (BID) | ORAL | 0 refills | Status: AC

## 2018-08-27 MED ORDER — CEFDINIR 300 MG PO CAPS: 300.0000 mg | ORAL_CAPSULE | Freq: Two times a day (BID) | ORAL | Status: DC

## 2018-08-27 MED ORDER — TRAMADOL HCL 50 MG PO TABS: 50.0000 mg | ORAL_TABLET | Freq: Four times a day (QID) | ORAL | 0 refills | Status: AC | PRN

## 2018-08-27 MED ORDER — PHENAZOPYRIDINE HCL 200 MG PO TABS: 200.0000 mg | ORAL_TABLET | Freq: Three times a day (TID) | ORAL | 0 refills | Status: DC | PRN

## 2018-08-27 NOTE — Care Management Note (Signed)
Case Management Note  Patient Details  Name: Andre Clements MRN: [id number] Date of Birth: Sep 28, 1971  Subjective/Objective:  Patient from home alone, has w/c-able to transfer from bed to w/c. HHRN-f/c care,instruction,aide-patient chose [facility] rep [facility] aware of d/c today, & HHC orders. Patient will transport to home on own by his Aunt-she will get patient's w/c from home to bring to hospital for safe transfer. No further CM needs.                  Action/Plan:d/c home w/HHC.   Expected Discharge Date:                  Expected Discharge Plan:  [place]  In-House Referral:     Discharge planning Services  CM Consult  Post Acute Care Choice:  (w/c) Choice offered to:  Patient  DME Arranged:    DME Agency:     HH Arranged:  RN, Nurse's Aide [place] Agency:  [facility]  Status of Service:  Completed, signed off  If discussed at [place] of Stay Meetings, dates discussed:    Additional Comments:  Dessa Phi, RN 08/27/2018, 11:30 AM

## 2018-08-27 NOTE — Discharge Summary (Signed)
Physician Discharge Summary  Andre Clements  JAS:[id number]  DOB: February 09, 1971  DOA: 08/25/2018 PCP: Hoyt Koch, MD  Admit date: 08/25/2018 Discharge date: 08/27/2018  Admitted From: Home  Disposition:  Home   Recommendations for Outpatient Follow-up:  1. Follow up with PCP in 1 week  2. Follow up with urology in 1 week 3. Plan is to remove Foley catheter in 7 to 10 days 4. Please obtain BMP/CBC in one week to monitor renal function and hemoglobin.   Home Health: RN/Aide/Social worker   Discharge Condition: Stable CODE STATUS: Full code Diet recommendation: Heart Healthy   Brief/Interim Summary: For full details see H&P/Progress note, but in brief, Andre Clements is a 47 year old male with medical history significant for hypertension, hyperlipidemia, diabetes mellitus type 2, GERD, mood disorders and paraplegia who presented to the emergency department complaining of generalized abdominal pain and discomfort, radiating to right flank area and suprapubic pressure.  ED evaluation patient was noted to be tachycardic, with leukocytosis.  UA reveals hematuria with large leukocytes and increasing WBC.  CT of the abdomen/pelvis shows bilateral moderate hydronephrosis with perinephric stranding likely due to large bladder stone.  Thickening gallbladder with fat stranding suggesting cystitis.  Patient was admitted with working diagnosis of sepsis secondary to cystitis and bladder stone.  Subjective: Patient seen and examined, he is doing much better, pain has significantly improved.  No acute events overnight.  Remains afebrile.  Urology cleared for discharge.  Discharge Diagnoses/Hospital Course:  Sepsis due to acute cystitis UA grossly abnormal, tachycardia and leukocytosis, place it on empiric antibiotics.  Urine culture shows multiple species.  She was treated with IV Rocephin x3-day, transitioned to oral Omnicef to complete total of 5 days of antibiotic therapy. Sepsis physiology  resolved.  Bilateral hydronephrosis secondary to bladder outlet obstruction from large bladder stone Urology was consulted and patient underwent cystolithalopaxy.  Patient was placed on CBI, urine clearing out.  Prescription for Pyridium was given. Per urology continue Foley catheter for at least 7 to 10 days.  They plan to follow-up with patient within a week.  Overflow incontinence/bladder plasm Continue oxybutynin and tramadol  Hypoxia  Due to atelectasis from prolonged anesthesia.  Patient successfully wean off oxygen after use of incentive spirometer.  Hypertension BP stable, lisinopril on hold given hydronephrosis.  Continue to monitor.  No function stable okay to resume lisinopril today.  Diabetes mellitus type 2 CBGs stable during hospital stay, continue home metformin and follow-up with PCP  Mood disorder Denies SI/HI Continue Haldol and Zoloft  Paraplegic Stable, home health arranged  All other chronic medical condition were stable during the hospitalization.  On the day of the discharge the patient's vitals were stable, and no other acute medical condition were reported by patient. the patient was felt safe to be discharge to home  Discharge Instructions  You were cared for by a hospitalist during your hospital stay. If you have any questions about your discharge medications or the care you received while you were in the hospital after you are discharged, you can call the unit and asked to speak with the hospitalist on call if the hospitalist that took care of you is not available. Once you are discharged, your primary care physician will handle any further medical issues. Please note that NO REFILLS for any discharge medications will be authorized once you are discharged, as it is imperative that you return to your primary care physician (or establish a relationship with a primary care physician if you  do not have one) for your aftercare needs so that they can reassess  your need for medications and monitor your lab values.  Discharge Instructions    Call MD for:  difficulty breathing, headache or visual disturbances   Complete by:  As directed    Call MD for:  extreme fatigue   Complete by:  As directed    Call MD for:  hives   Complete by:  As directed    Call MD for:  persistant dizziness or light-headedness   Complete by:  As directed    Call MD for:  persistant nausea and vomiting   Complete by:  As directed    Call MD for:  redness, tenderness, or signs of infection (pain, swelling, redness, odor or green/yellow discharge around incision site)   Complete by:  As directed    Call MD for:  severe uncontrolled pain   Complete by:  As directed    Call MD for:  temperature >100.4   Complete by:  As directed    Diet - low sodium heart healthy   Complete by:  As directed    Increase activity slowly   Complete by:  As directed      Allergies as of 08/27/2018   No Known Allergies     Medication List    STOP taking these medications   ciprofloxacin 500 MG tablet Commonly known as:  CIPRO   ibuprofen 200 MG tablet Commonly known as:  ADVIL,MOTRIN     TAKE these medications   cefdinir 300 MG capsule Commonly known as:  OMNICEF Take 1 capsule (300 mg total) by mouth every 12 (twelve) hours. Start taking on:  08/28/2018   CENTRUM VITAMINTS PO Take 1 tablet by mouth daily.   haloperidol 5 MG tablet Commonly known as:  HALDOL Take 1 tablet (5 mg total) by mouth daily. What changed:  when to take this   IRON SUPPLEMENT 325 (65 FE) MG tablet Generic drug:  ferrous sulfate Take 325 mg by mouth daily.   lisinopril 20 MG tablet Commonly known as:  PRINIVIL,ZESTRIL TAKE 1 TABLET BY MOUTH  DAILY   metFORMIN 500 MG 24 hr tablet Commonly known as:  GLUCOPHAGE-XR Take 1 tablet (500 mg total) by mouth daily with breakfast.   omeprazole 40 MG capsule Commonly known as:  PRILOSEC Take 1 capsule (40 mg total) by mouth daily.   oxybutynin 5  MG tablet Commonly known as:  DITROPAN Take 1 tablet (5 mg total) by mouth every 8 (eight) hours as needed for bladder spasms.   phenazopyridine 200 MG tablet Commonly known as:  PYRIDIUM Take 1 tablet (200 mg total) by mouth 3 (three) times daily as needed (for pain with urination).   sertraline 100 MG tablet Commonly known as:  ZOLOFT Take 2 tablets (200 mg total) by mouth daily. Two tablets by mouth daily What changed:  additional instructions   simvastatin 40 MG tablet Commonly known as:  ZOCOR Take 1 tablet (40 mg total) by mouth daily at 6 PM.   traMADol 50 MG tablet Commonly known as:  ULTRAM Take 1 tablet (50 mg total) by mouth every 6 (six) hours as needed for up to 3 days.   Vitamin C 250 MG Chew Chew 500 each by mouth daily.   Vitamin D3 5000 units Caps Take 5,000 Units by mouth daily.      Follow-up Information    Ceasar Mons, MD In 10 days.   Specialty:  Urology Why:  Catheter removal Contact information: [address] [place] [address] [phone]        [facility] Follow up.   Specialty:  [place] Why:  [facility] nursing/aide/jsocial worker Contact information: [address] [place] [address] [phone]        Hoyt Koch, MD. Schedule an appointment as soon as possible for a visit in 1 week(s).   Specialty:  Internal Medicine Why:  Hospital follow up Contact information: [address] [place] [address] [phone]          No Known Allergies  Consultations: Urology   Procedures/Studies: Ct Abdomen Pelvis W Contrast  Result Date: 08/25/2018 CLINICAL DATA:  Right-sided pain.  Constipation. EXAM: CT ABDOMEN AND PELVIS WITH CONTRAST TECHNIQUE: Multidetector CT imaging of the abdomen and pelvis was performed using the standard protocol following bolus administration of intravenous contrast. CONTRAST:  175mL ISOVUE-300 IOPAMIDOL (ISOVUE-300) INJECTION  61% COMPARISON:  None. FINDINGS: Lower chest: There is a nodule in the left base on series 7, image 20 measuring 3 mm. Mild scattered atelectasis. No other abnormalities in the lung bases. Hepatobiliary: Hepatic steatosis. The liver, gallbladder, and portal vein are otherwise normal. Pancreas: Unremarkable. No pancreatic ductal dilatation or surrounding inflammatory changes. Spleen: Normal in size without focal abnormality. Adrenals/Urinary Tract: Adrenal glands are normal. Bilateral hydronephrosis and perinephric stranding is identified. A tiny stone seen in the left kidney on series 5, image 76. A few scattered low-attenuation lesions in the kidneys are most consistent with small cysts but too small to characterize. Minimal excretion into the renal collecting systems on delayed imaging. Marked ureterectasis, right greater than left but bilateral. No ureteral stones are noted. There is a large calcification in the inferior bladder. There is bladder wall thickening and fat stranding adjacent to the bladder. Stomach/Bowel: The stomach and small bowel are unremarkable. The colon and appendix are normal. Vascular/Lymphatic: Shotty nodes in the retroperitoneum are likely reactive. The abdominal aorta is unremarkable. An IVC filter is identified. Reproductive: Prominence of the urethra as it passes the prostate. The prostate is cells otherwise unremarkable. Other: There is a fat containing umbilical hernia. Fat stranding is seen in the left buttocks. No other acute abnormalities. Musculoskeletal: Postoperative changes in the lumbar spine. No acute bony abnormalities are identified. IMPRESSION: 1. Bilateral moderate hydronephrosis, perinephric stranding, and marked ureterectasis. Given the bilateral nature, I suspect the bilateral hydronephrosis may be due to the large bladder stone. 2. The bladder is thick walled with adjacent fat stranding suggesting cystitis. Recommend correlation with urinalysis. 3. Prominence of the  urethra as it passes the prostate may be postprocedural. Recommend clinical correlation. 4. 3 mm nodule in the left lung base. No follow-up needed if patient is low-risk. Non-contrast chest CT can be considered in 12 months if patient is high-risk. This recommendation follows the consensus statement: Guidelines for Management of Incidental Pulmonary Nodules Detected on CT Images: From the Fleischner Society 2017; Radiology 2017; 284:228-243. 5. Hepatic steatosis. 6. Nonobstructive stone in the left kidney. Probable small renal cyst, too small to characterize. 7. Shotty nodes in the retroperitoneum are likely reactive given the renal abnormality discussed above. 8. Tiny fat containing umbilical hernia. 9. Possible cellulitis in the left buttocks. Electronically Signed   By: Dorise Bullion III M.D   On: 08/25/2018 08:29   Dg Abd Acute W/chest  Result Date: 08/25/2018 CLINICAL DATA:  Abdominal pain. Constipation. Patient took a laxative and had diarrhea. History of hypertension. Nonsmoker. EXAM:  DG ABDOMEN ACUTE W/ 1V CHEST COMPARISON:  Chest 10/08/2010 FINDINGS: Shallow inspiration. Mild cardiac enlargement. Lungs are clear and expanded. No blunting of costophrenic angles. No pneumothorax. Scattered gas and stool in the colon. Scattered gas-filled small bowel. No small or large bowel dilatation. Some of the small bowel loops suggest wall thickening which could indicate enteritis. No free intra-abdominal air. No abnormal air-fluid levels. No radiopaque stones. Inferior vena caval filter. Postoperative changes at the thoracolumbar junction. Density in the pelvis could represent a large calcified bladder stone or residual contrast material in the bladder. IMPRESSION: Mild cardiac enlargement. No evidence of active pulmonary disease. Nonobstructive bowel gas pattern with scattered gas in the colon and small bowel possibly indicating ileus or enteritis. Electronically Signed   By: Lucienne Capers M.D.   On: 08/25/2018  05:31    Discharge Exam: Vitals:   08/26/18 2117 08/27/18 0404  BP: 127/76 119/67  Pulse: 70 68  Resp: 16 16  Temp: 98.9 F (37.2 C) 98.4 F (36.9 C)  SpO2: 98% 100%   Vitals:   08/26/18 1500 08/26/18 1522 08/26/18 2117 08/27/18 0404  BP: 129/70 131/71 127/76 119/67  Pulse: 87 78 70 68  Resp: (!) 24 (!) 22 16 16   Temp: (!) 97.1 F (36.2 C) 98.6 F (37 C) 98.9 F (37.2 C) 98.4 F (36.9 C)  TempSrc:  Oral Oral Oral  SpO2: (!) 88% 93% 98% 100%  Weight:      Height:        General: NAD  Cardiovascular: RRR, S1/S2 +, no rubs, no gallops Respiratory: CTA bilaterally, no wheezing, no rhonchi Abdominal: Soft, NT, ND, bowel sounds + GU: Foley in place, urine clear  Extremities: LE paraplegia   The results of significant diagnostics from this hospitalization (including imaging, microbiology, ancillary and laboratory) are listed below for reference.     Microbiology: Recent Results (from the past 240 hour(s))  Urine culture     Status: Abnormal   Collection Time: 08/25/18  5:42 AM  Result Value Ref Range Status   Specimen Description   Final    URINE, CATHETERIZED Performed at [facility] [address], [place], [place] [address]    Special Requests   Final    NONE Performed at [facility], [place] [address] [place], [place] [address]    Culture MULTIPLE SPECIES PRESENT, SUGGEST RECOLLECTION (A)  Final   Report Status 08/26/2018 FINAL  Final  Culture, blood (routine x 2)     Status: None (Preliminary result)   Collection Time: 08/25/18 11:49 AM  Result Value Ref Range Status   Specimen Description   Final    BLOOD RIGHT ANTECUBITAL Performed at [facility] [address], [place], [place] [address]    Special Requests   Final    BOTTLES DRAWN AEROBIC ONLY Blood Culture adequate volume Performed at [facility] [address], [place], [place] [address]    Culture   Final    NO  GROWTH 2 DAYS Performed at [facility] [address], [place], [place] [address]    Report Status PENDING  Incomplete  Culture, blood (routine x 2)     Status: None (Preliminary result)   Collection Time: 08/25/18 11:52 AM  Result Value Ref Range Status   Specimen Description   Final    BLOOD RIGHT HAND Performed at [facility] [address] [place], [place] [address]    Special Requests  Final    BOTTLES DRAWN AEROBIC ONLY Blood Culture adequate volume Performed at [facility] [address] [place], [place] [address]    Culture   Final    NO GROWTH 2 DAYS Performed at [facility] [address] [place], [place] [address]    Report Status PENDING  Incomplete  Surgical PCR screen     Status: None   Collection Time: 08/25/18  6:17 PM  Result Value Ref Range Status   MRSA, PCR NEGATIVE NEGATIVE Final   Staphylococcus aureus NEGATIVE NEGATIVE Final    Comment: (NOTE) The Xpert SA Assay (FDA approved for NASAL specimens in patients 7 years of age and older), is one component of a comprehensive surveillance program. It is not intended to diagnose infection nor to guide or monitor treatment. Performed at [facility], [place] [address] [place], [place] [address]      Labs: BNP (last 3 results) No results for input(s): BNP in the last 8760 hours. Basic Metabolic Panel: Recent Labs  Lab 08/25/18 0534 08/26/18 0511 08/27/18 0505  NA 142 145 145  K 4.0 4.4 4.7  CL 103 107 109  CO2 29 30 28   GLUCOSE 112* 96 124*  BUN 17 13 15   CREATININE 1.09 1.02 1.17  CALCIUM 9.0 8.2* 8.0*  MG  --   --  2.5*   Liver Function Tests: Recent Labs  Lab 08/25/18 0534  AST 14*  ALT 14  ALKPHOS 49  BILITOT 0.6  PROT 7.1  ALBUMIN 3.4*   Recent Labs  Lab 08/25/18 0534  LIPASE 28   No results for input(s): AMMONIA in the last 168 hours. CBC: Recent Labs  Lab 08/25/18 0534 08/26/18 0511  08/27/18 0505  WBC 16.0* 12.2* 13.6*  NEUTROABS 12.4*  --  10.7*  HGB 11.2* 9.6* 9.1*  HCT 34.7* 30.3* 29.3*  MCV 63.9* 65.2* 65.8*  PLT 373 307 329   Cardiac Enzymes: No results for input(s): CKTOTAL, CKMB, CKMBINDEX, TROPONINI in the last 168 hours. BNP: Invalid input(s): POCBNP CBG: Recent Labs  Lab 08/26/18 1428 08/26/18 1621 08/26/18 2119 08/27/18 0742 08/27/18 1149  GLUCAP 107* 137* 143* 127* 100*   D-Dimer No results for input(s): DDIMER in the last 72 hours. Hgb A1c No results for input(s): HGBA1C in the last 72 hours. Lipid Profile No results for input(s): CHOL, HDL, LDLCALC, TRIG, CHOLHDL, LDLDIRECT in the last 72 hours. Thyroid function studies No results for input(s): TSH, T4TOTAL, T3FREE, THYROIDAB in the last 72 hours.  Invalid input(s): FREET3 Anemia work up No results for input(s): VITAMINB12, FOLATE, FERRITIN, TIBC, IRON, RETICCTPCT in the last 72 hours. Urinalysis    Component Value Date/Time   COLORURINE YELLOW 08/25/2018 0542   APPEARANCEUR CLOUDY (A) 08/25/2018 0542   LABSPEC 1.010 08/25/2018 0542   PHURINE 8.0 08/25/2018 0542   GLUCOSEU NEGATIVE 08/25/2018 0542   HGBUR MODERATE (A) 08/25/2018 0542   BILIRUBINUR NEGATIVE 08/25/2018 0542   BILIRUBINUR Neg 02/14/2018 0815   KETONESUR NEGATIVE 08/25/2018 0542   PROTEINUR >=300 (A) 08/25/2018 0542   UROBILINOGEN 0.2 02/14/2018 0815   UROBILINOGEN 0.2 01/01/2011 0449   NITRITE NEGATIVE 08/25/2018 0542   LEUKOCYTESUR LARGE (A) 08/25/2018 0542   Sepsis Labs Invalid input(s): PROCALCITONIN,  WBC,  LACTICIDVEN Microbiology Recent Results (from the past 240 hour(s))  Urine culture     Status: Abnormal   Collection Time: 08/25/18  5:42 AM  Result Value Ref Range Status   Specimen Description   Final  URINE, CATHETERIZED Performed at [facility], [place] [address], [place], [place] [address]    Special Requests   Final    NONE Performed at [facility],  [place] [address], [place], [place] [address]    Culture MULTIPLE SPECIES PRESENT, SUGGEST RECOLLECTION (A)  Final   Report Status 08/26/2018 FINAL  Final  Culture, blood (routine x 2)     Status: None (Preliminary result)   Collection Time: 08/25/18 11:49 AM  Result Value Ref Range Status   Specimen Description   Final    BLOOD RIGHT ANTECUBITAL Performed at [facility] [address] [place], [place] [address]    Special Requests   Final    BOTTLES DRAWN AEROBIC ONLY Blood Culture adequate volume Performed at [facility] [address] [place], [place] [address]    Culture   Final    NO GROWTH 2 DAYS Performed at [facility] [address], [place], [place] [address]    Report Status PENDING  Incomplete  Culture, blood (routine x 2)     Status: None (Preliminary result)   Collection Time: 08/25/18 11:52 AM  Result Value Ref Range Status   Specimen Description   Final    BLOOD RIGHT HAND Performed at [facility] [address], [place], [place] [address]    Special Requests   Final    BOTTLES DRAWN AEROBIC ONLY Blood Culture adequate volume Performed at [facility] [address], [place], [place] [address]    Culture   Final    NO GROWTH 2 DAYS Performed at [facility] [address], [place], [place] [address]    Report Status PENDING  Incomplete  Surgical PCR screen     Status: None   Collection Time: 08/25/18  6:17 PM  Result Value Ref Range Status   MRSA, PCR NEGATIVE NEGATIVE Final   Staphylococcus aureus NEGATIVE NEGATIVE Final    Comment: (NOTE) The Xpert SA Assay (FDA approved for NASAL specimens in patients 12 years of age and older), is one component of a comprehensive surveillance program. It is not intended to diagnose infection nor to guide or monitor treatment. Performed at [facility], [place] [address], [place], [place] [address]      Time coordinating discharge: 32 minutes  SIGNED:  Chipper Oman, MD  [facility] 08/27/2018, 12:30 PM  Pager please text page via  [email or web address]  Note - This record has been created using [employer]. Chart creation errors have been sought, but may not always have been located. Such creation errors do not reflect on the standard of medical care.

## 2018-08-27 NOTE — Progress Notes (Signed)
Patient leaking urine around the tip of his penis with foley catheter in place. Urine is clear and yellow with no visible clots with CBI being stopped for about 3 hours. RN performed hand irrigation to be sure there were no clots causing the leaking. No clots returned. RN added 3cc more of NS to catheter balloon to see if that would reduce leaking. Also patient was given a ditropan tablet for bladder spasms. Patient educated on foley catheter care.   Andre Clements, [place] Din 08/27/2018 2:40 PM

## 2018-08-27 NOTE — Progress Notes (Addendum)
Patient is feeling much better this morning.  He denies bladder spasms or suprapubic pain.  He does state that the catheter somewhat uncomfortable, but manageable.  Overall, doing well.  Our plan is to have him follow-up in 7-10 days to remove his catheter.  I had a long discussion with the patient about the need to start clean intermittent catheterization at least 3-4 times daily to help empty his bladder as the bladder stone was formed due to him not emptying his bladder completely.   He voices understanding.  PRN tramadol, Pyridium and oxybutynin on chart.   Ellison Hughs, M.D.

## 2018-08-28 ENCOUNTER — Telehealth: Payer: Self-pay | Admitting: *Deleted

## 2018-08-28 NOTE — Telephone Encounter (Signed)
Called pt to verify hosp appt that has been scheduled for 09/03/18. Pt states yes he called this morning to schedule. Inform pt had some additional questions concerning his discharge. Completed TCM call below.Johny Chess  Transition Care Management Follow-up Telephone Call   Date discharged? 08/27/18   How have you been since you were released from the hospital? Pt states he is feeling alright   Do you understand why you were in the hospital? YES   Do you understand the discharge instructions? YES   Where were you discharged to? Home   Items Reviewed:  Medications reviewed: YES, no longer taking the cipro  Allergies reviewed: YES  Dietary changes reviewed: YES  Referrals reviewed: YES, he states advance is coming out this Thurs. Still waiting on appt w/urologist   Functional Questionnaire:   Activities of Daily Living (ADLs):   He states he are independent in the following: ambulation, bathing and hygiene, feeding, continence, grooming, toileting and dressing States he doesn't require assistance   Any transportation issues/concerns?: NO   Any patient concerns? NO   Confirmed importance and date/time of follow-up visits scheduled YES. appt 09/03/18  Provider Appointment booked with Dr. Sharlet Salina  Confirmed with patient if condition begins to worsen call PCP or go to the ER.  Patient was given the office number and encouraged to call back with question or concerns.  : YES

## 2018-08-29 DIAGNOSIS — Z9889 Other specified postprocedural states: Secondary | ICD-10-CM | POA: Diagnosis not present

## 2018-08-29 DIAGNOSIS — N1330 Unspecified hydronephrosis: Secondary | ICD-10-CM | POA: Diagnosis not present

## 2018-08-29 DIAGNOSIS — N3000 Acute cystitis without hematuria: Secondary | ICD-10-CM | POA: Diagnosis not present

## 2018-08-29 DIAGNOSIS — I119 Hypertensive heart disease without heart failure: Secondary | ICD-10-CM | POA: Diagnosis not present

## 2018-08-29 DIAGNOSIS — K219 Gastro-esophageal reflux disease without esophagitis: Secondary | ICD-10-CM | POA: Diagnosis not present

## 2018-08-29 DIAGNOSIS — Z87442 Personal history of urinary calculi: Secondary | ICD-10-CM | POA: Diagnosis not present

## 2018-08-29 DIAGNOSIS — G8220 Paraplegia, unspecified: Secondary | ICD-10-CM | POA: Diagnosis not present

## 2018-08-29 DIAGNOSIS — Z5181 Encounter for therapeutic drug level monitoring: Secondary | ICD-10-CM | POA: Diagnosis not present

## 2018-08-29 DIAGNOSIS — E785 Hyperlipidemia, unspecified: Secondary | ICD-10-CM | POA: Diagnosis not present

## 2018-08-29 DIAGNOSIS — E119 Type 2 diabetes mellitus without complications: Secondary | ICD-10-CM | POA: Diagnosis not present

## 2018-08-29 DIAGNOSIS — Z7984 Long term (current) use of oral hypoglycemic drugs: Secondary | ICD-10-CM | POA: Diagnosis not present

## 2018-08-29 DIAGNOSIS — Z960 Presence of urogenital implants: Secondary | ICD-10-CM | POA: Diagnosis not present

## 2018-08-30 ENCOUNTER — Telehealth: Payer: Self-pay | Admitting: Internal Medicine

## 2018-08-30 LAB — CULTURE, BLOOD (ROUTINE X 2)
CULTURE: NO GROWTH
Culture: NO GROWTH
SPECIAL REQUESTS: ADEQUATE
SPECIAL REQUESTS: ADEQUATE

## 2018-08-30 NOTE — Telephone Encounter (Signed)
Fine but must keep follow up apt.

## 2018-08-30 NOTE — Telephone Encounter (Signed)
LVM for Andre Clements to call back for MD response as Voicemail did not state that it was her I did not want to leave a detailed message.

## 2018-08-30 NOTE — Telephone Encounter (Signed)
Copied from [place] [phone]. Topic: General - Other >> Aug 30, 2018  1:43 PM Lennox Solders wrote: Reason for CRM: Zara Chess adv home care is calling and hospital gave them the order to go out and evaluate this patient. Melissa needs orders to continue home health visits for this patient. Pt has an appt with dr Sharlet Salina on 09-03-18

## 2018-08-31 DIAGNOSIS — Z87442 Personal history of urinary calculi: Secondary | ICD-10-CM | POA: Diagnosis not present

## 2018-08-31 DIAGNOSIS — N3000 Acute cystitis without hematuria: Secondary | ICD-10-CM | POA: Diagnosis not present

## 2018-08-31 DIAGNOSIS — Z7984 Long term (current) use of oral hypoglycemic drugs: Secondary | ICD-10-CM | POA: Diagnosis not present

## 2018-08-31 DIAGNOSIS — I119 Hypertensive heart disease without heart failure: Secondary | ICD-10-CM | POA: Diagnosis not present

## 2018-08-31 DIAGNOSIS — N1330 Unspecified hydronephrosis: Secondary | ICD-10-CM | POA: Diagnosis not present

## 2018-08-31 DIAGNOSIS — K219 Gastro-esophageal reflux disease without esophagitis: Secondary | ICD-10-CM | POA: Diagnosis not present

## 2018-08-31 DIAGNOSIS — G8220 Paraplegia, unspecified: Secondary | ICD-10-CM | POA: Diagnosis not present

## 2018-08-31 DIAGNOSIS — E119 Type 2 diabetes mellitus without complications: Secondary | ICD-10-CM | POA: Diagnosis not present

## 2018-08-31 DIAGNOSIS — Z960 Presence of urogenital implants: Secondary | ICD-10-CM | POA: Diagnosis not present

## 2018-08-31 DIAGNOSIS — Z5181 Encounter for therapeutic drug level monitoring: Secondary | ICD-10-CM | POA: Diagnosis not present

## 2018-08-31 DIAGNOSIS — E785 Hyperlipidemia, unspecified: Secondary | ICD-10-CM | POA: Diagnosis not present

## 2018-08-31 DIAGNOSIS — Z9889 Other specified postprocedural states: Secondary | ICD-10-CM | POA: Diagnosis not present

## 2018-08-31 NOTE — Telephone Encounter (Signed)
Melissa called back and gave verbals states will fax over orders to sign as well

## 2018-09-03 ENCOUNTER — Ambulatory Visit (INDEPENDENT_AMBULATORY_CARE_PROVIDER_SITE_OTHER): Payer: Medicare Other | Admitting: Internal Medicine

## 2018-09-03 ENCOUNTER — Other Ambulatory Visit (INDEPENDENT_AMBULATORY_CARE_PROVIDER_SITE_OTHER): Payer: Medicare Other

## 2018-09-03 ENCOUNTER — Telehealth: Payer: Self-pay

## 2018-09-03 ENCOUNTER — Encounter: Payer: Self-pay | Admitting: Internal Medicine

## 2018-09-03 ENCOUNTER — Inpatient Hospital Stay: Payer: Self-pay | Admitting: Internal Medicine

## 2018-09-03 VITALS — BP 112/70 | HR 70 | Temp 98.7°F | Ht 68.0 in | Wt 212.0 lb

## 2018-09-03 DIAGNOSIS — N210 Calculus in bladder: Secondary | ICD-10-CM

## 2018-09-03 DIAGNOSIS — Z23 Encounter for immunization: Secondary | ICD-10-CM | POA: Diagnosis not present

## 2018-09-03 LAB — COMPREHENSIVE METABOLIC PANEL
ALBUMIN: 3.9 g/dL (ref 3.5–5.2)
ALK PHOS: 58 U/L (ref 39–117)
ALT: 12 U/L (ref 0–53)
AST: 13 U/L (ref 0–37)
BUN: 18 mg/dL (ref 6–23)
CHLORIDE: 103 meq/L (ref 96–112)
CO2: 28 mEq/L (ref 19–32)
Calcium: 9.2 mg/dL (ref 8.4–10.5)
Creatinine, Ser: 0.88 mg/dL (ref 0.40–1.50)
GFR: 120.41 mL/min (ref >60.00)
GLUCOSE: 84 mg/dL (ref 70–99)
POTASSIUM: 4.3 meq/L (ref 3.5–5.1)
SODIUM: 138 meq/L (ref 135–145)
TOTAL PROTEIN: 7.8 g/dL (ref 6.0–8.3)
Total Bilirubin: 0.2 mg/dL (ref 0.2–1.2)

## 2018-09-03 LAB — STONE ANALYSIS
AMMONIUM ACID URATE: 30 %
CA PHOS CRY STONE QL IR: 40 %
Ca Oxalate,Monohydr.: 10 %
MAGNESIUM AMMON PHOS: 15 %
Sodium Acid Urate: 5 %
Stone Weight KSTONE: 37723 mg

## 2018-09-03 LAB — CBC
HEMATOCRIT: 34.5 % — AB (ref 39.0–52.0)
Hemoglobin: 11.1 g/dL — ABNORMAL LOW (ref 13.0–17.0)
MCHC: 32.2 g/dL (ref 30.0–36.0)
MCV: 63 fl — ABNORMAL LOW (ref 78.0–100.0)
Platelets: 498 10*3/uL — ABNORMAL HIGH (ref 150.0–400.0)
RBC: 5.47 Mil/uL (ref 4.22–5.81)
RDW: 17 % — ABNORMAL HIGH (ref 11.5–15.5)
WBC: 13.5 10*3/uL — AB (ref 4.0–10.5)

## 2018-09-03 NOTE — Progress Notes (Signed)
   Subjective:    Patient ID: Andre Clements, male    DOB: 14-Nov-1971, 47 y.o.   MRN: [id number]  HPI The patient is a 47 YO man coming in for hospital follow up (in for bladder stone, had to be broken out, left with catheter). He saw urologist this morning and they removed. He is going to self cath now 3 times per day. Denies pain in his stomach or with cathing. He denies fevers or chills. Finishing antibiotics as per directions. Denies chest pains or SOB. Denies constipation or diarrhea. He does need more cath supplies at home. Is getting around normally.   PMH, [facility], social history reviewed and updated.   Phone number [facility] [phone])  Review of Systems  Constitutional: Negative.   HENT: Negative.   Eyes: Negative.   Respiratory: Negative for cough, chest tightness and shortness of breath.   Cardiovascular: Negative for chest pain, palpitations and leg swelling.  Gastrointestinal: Negative for abdominal distention, abdominal pain, constipation, diarrhea, nausea and vomiting.  Musculoskeletal: Positive for gait problem.  Skin: Negative.   Neurological: Negative for dizziness, tremors, syncope, facial asymmetry, speech difficulty and weakness.  Psychiatric/Behavioral: Negative.       Objective:   Physical Exam  Constitutional: He is oriented to person, place, and time. He appears well-developed and well-nourished.  HENT:  Head: Normocephalic and atraumatic.  Eyes: EOM are normal.  Neck: Normal range of motion.  Cardiovascular: Normal rate and regular rhythm.  Pulmonary/Chest: Effort normal and breath sounds normal. No respiratory distress. He has no wheezes. He has no rales.  Abdominal: Soft. Bowel sounds are normal. He exhibits no distension. There is no tenderness. There is no rebound.  Musculoskeletal: He exhibits no edema.  Neurological: He is alert and oriented to person, place, and time. Coordination abnormal.  Wheelchair bound self propels  Skin: Skin is warm and  dry.  Psychiatric: He has a normal mood and affect.   Vitals:   09/03/18 1050  BP: 112/70  Pulse: 70  Temp: 98.7 F (37.1 C)  TempSrc: Oral  SpO2: 97%  Weight: 212 lb (96.2 kg)  Height: 5\' 8"  (1.727 m)      Assessment & Plan:  Flu shot given at visit

## 2018-09-03 NOTE — Telephone Encounter (Signed)
Called [facility] to put an order in for patient catheter supplies. Patients insurance only covers 35 for 30 days so order was filled for that. Patient should be getting an automated call confirming the order

## 2018-09-03 NOTE — Patient Instructions (Signed)
We have given you the flu shot.   We will check the labs today.

## 2018-09-04 DIAGNOSIS — Z5181 Encounter for therapeutic drug level monitoring: Secondary | ICD-10-CM | POA: Diagnosis not present

## 2018-09-04 DIAGNOSIS — Z960 Presence of urogenital implants: Secondary | ICD-10-CM | POA: Diagnosis not present

## 2018-09-04 DIAGNOSIS — N3000 Acute cystitis without hematuria: Secondary | ICD-10-CM | POA: Diagnosis not present

## 2018-09-04 DIAGNOSIS — G8220 Paraplegia, unspecified: Secondary | ICD-10-CM | POA: Diagnosis not present

## 2018-09-04 DIAGNOSIS — I119 Hypertensive heart disease without heart failure: Secondary | ICD-10-CM | POA: Diagnosis not present

## 2018-09-04 DIAGNOSIS — Z7984 Long term (current) use of oral hypoglycemic drugs: Secondary | ICD-10-CM | POA: Diagnosis not present

## 2018-09-04 DIAGNOSIS — E785 Hyperlipidemia, unspecified: Secondary | ICD-10-CM | POA: Diagnosis not present

## 2018-09-04 DIAGNOSIS — E119 Type 2 diabetes mellitus without complications: Secondary | ICD-10-CM | POA: Diagnosis not present

## 2018-09-04 DIAGNOSIS — Z87442 Personal history of urinary calculi: Secondary | ICD-10-CM | POA: Diagnosis not present

## 2018-09-04 DIAGNOSIS — N1330 Unspecified hydronephrosis: Secondary | ICD-10-CM | POA: Diagnosis not present

## 2018-09-04 DIAGNOSIS — K219 Gastro-esophageal reflux disease without esophagitis: Secondary | ICD-10-CM | POA: Diagnosis not present

## 2018-09-04 DIAGNOSIS — Z9889 Other specified postprocedural states: Secondary | ICD-10-CM | POA: Diagnosis not present

## 2018-09-05 NOTE — Assessment & Plan Note (Signed)
Recovering well. Checking CBC and CMP to check for continued recovery of blood counts. There was some AKI with the hydronephrosis so making sure that this is not worsening.

## 2018-09-06 DIAGNOSIS — E785 Hyperlipidemia, unspecified: Secondary | ICD-10-CM | POA: Diagnosis not present

## 2018-09-06 DIAGNOSIS — I119 Hypertensive heart disease without heart failure: Secondary | ICD-10-CM | POA: Diagnosis not present

## 2018-09-06 DIAGNOSIS — Z87442 Personal history of urinary calculi: Secondary | ICD-10-CM | POA: Diagnosis not present

## 2018-09-06 DIAGNOSIS — Z7984 Long term (current) use of oral hypoglycemic drugs: Secondary | ICD-10-CM | POA: Diagnosis not present

## 2018-09-06 DIAGNOSIS — Z5181 Encounter for therapeutic drug level monitoring: Secondary | ICD-10-CM | POA: Diagnosis not present

## 2018-09-06 DIAGNOSIS — Z9889 Other specified postprocedural states: Secondary | ICD-10-CM | POA: Diagnosis not present

## 2018-09-06 DIAGNOSIS — E119 Type 2 diabetes mellitus without complications: Secondary | ICD-10-CM | POA: Diagnosis not present

## 2018-09-06 DIAGNOSIS — G8220 Paraplegia, unspecified: Secondary | ICD-10-CM | POA: Diagnosis not present

## 2018-09-06 DIAGNOSIS — Z960 Presence of urogenital implants: Secondary | ICD-10-CM | POA: Diagnosis not present

## 2018-09-06 DIAGNOSIS — N1330 Unspecified hydronephrosis: Secondary | ICD-10-CM | POA: Diagnosis not present

## 2018-09-06 DIAGNOSIS — N3000 Acute cystitis without hematuria: Secondary | ICD-10-CM | POA: Diagnosis not present

## 2018-09-06 DIAGNOSIS — K219 Gastro-esophageal reflux disease without esophagitis: Secondary | ICD-10-CM | POA: Diagnosis not present

## 2018-09-08 ENCOUNTER — Other Ambulatory Visit: Payer: Self-pay | Admitting: Internal Medicine

## 2018-09-08 DIAGNOSIS — I10 Essential (primary) hypertension: Secondary | ICD-10-CM

## 2018-09-10 ENCOUNTER — Other Ambulatory Visit: Payer: Self-pay

## 2018-09-10 ENCOUNTER — Ambulatory Visit: Payer: Medicare Other | Attending: Internal Medicine | Admitting: Physical Therapy

## 2018-09-10 DIAGNOSIS — R293 Abnormal posture: Secondary | ICD-10-CM | POA: Diagnosis not present

## 2018-09-10 DIAGNOSIS — G8222 Paraplegia, incomplete: Secondary | ICD-10-CM

## 2018-09-10 DIAGNOSIS — R208 Other disturbances of skin sensation: Secondary | ICD-10-CM | POA: Diagnosis not present

## 2018-09-10 DIAGNOSIS — M25671 Stiffness of right ankle, not elsewhere classified: Secondary | ICD-10-CM | POA: Diagnosis not present

## 2018-09-10 DIAGNOSIS — M25661 Stiffness of right knee, not elsewhere classified: Secondary | ICD-10-CM

## 2018-09-10 DIAGNOSIS — M6281 Muscle weakness (generalized): Secondary | ICD-10-CM

## 2018-09-10 NOTE — Therapy (Signed)
[place] [address] [place], [place], [address] Phone: [phone]   Fax:  [phone]  Physical Therapy Evaluation  Patient Details  Name: Andre Clements MRN: [id number] Date of Birth: 1971-01-08 Referring Provider: Hoyt Koch, MD   Encounter Date: 09/10/2018  PT End of Session - 09/10/18 2141    Visit Number  1    Number of Visits  1    Authorization Type  [employer] Medicare    PT Start Time  0102    PT Stop Time  1550    PT Time Calculation (min)  65 min    Activity Tolerance  Patient tolerated treatment well       Past Medical History:  Diagnosis Date  . Bipolar 2 disorder ([place])   . Blood in stool   . Depression   . Hyperlipidemia   . Hypertension   . Urine incontinence     Past Surgical History:  Procedure Laterality Date  . ABDOMINAL HERNIA REPAIR  2003  . arm surgery Right    from suicide attempt  . CYSTOSCOPY WITH LITHOLAPAXY N/A 08/26/2018   Procedure: CYSTOSCOPY WITH LITHOLAPAXY;  Surgeon: Ceasar Mons, MD;  Location: [facility];  Service: Urology;  Laterality: N/A;    There were no vitals filed for this visit.   Subjective Assessment - 09/10/18 2132    Subjective  Pt presents to PT evaluation for assessment for new ultra lightweight manual wheelchair.  Pt's current chair is almost 45 years old (in November), is in poor condition and does not support postural needs for safe mobility.  Pt is a lifetime wheelchair user and requires use of manual w/c for independent mobility in his home and community and to safely perform ADLs.  Pt lives alone but is often transported by his elderly aunt who must lift current w/c into the back of her Lucianne Lei.   Also present for w/c evaluation was [employer], [employer] from [facility].    Pertinent History  HTN, pituitary insufficiency, bipolar 2 disorder, depression, hyperlipidemia, multiple suicide attempts: RUE surgery due to suicide attempt and paraplegia from  suicide attempt in 1998    Patient Stated Goals  to obtain a new manual w/c    Currently in Pain?  No/denies         [facility] PT Assessment - 09/10/18 2139      Assessment   Medical Diagnosis  incomplete paraplegia    Referring Provider  Hoyt Koch, MD    Onset Date/Surgical Date  12/22/17   date of referral; 1998 date of injury     Balance Screen   Has the patient fallen in the past 6 months  No    Has the patient had a decrease in activity level because of a fear of falling?   No    Is the patient reluctant to leave their home because of a fear of falling?   No      Prior Function   Level of Independence  Requires assistive device for independence;Independent with transfers;Independent with homemaking with wheelchair;Independent with community mobility with device;Independent with household mobility with device;Independent with basic ADLs   requires manual w/c for independence       Mobility/Seating Evaluation    PATIENT INFORMATION: Name: Andre Clements DOB: 1971-11-24  Sex: Male Date seen: 09/10/18 Time: 14:45  Address:  [address], [place], [place] [address] Physician: Pricilla Holm, MD This evaluation/justification form will serve as the LMN for the following  suppliers: __________________________ Supplier: [employer] Contact Person: Luz Brazen, ATP Phone:  [phone]   Seating Therapist: Misty Stanley, PT Phone:   [phone]   Phone: [phone]    Spouse/Parent/Caregiver name: Lissa Hoard  Phone number: [phone] Insurance/Payer: [place] [employer]     Reason for Referral: To obtain a new manual wheelchair  Patient/Caregiver Goals: To maintain independent mobility in his home and community and to obtain a new ultra-lightweight manual wheelchair for improved positioning and to allow his elderly aunt to lift/load into her Lucianne Lei when she transports him.  Patient was seen for face-to-face evaluation for new manual  wheelchair.  Also present was [employer], [employer] to discuss recommendations and wheelchair options.  Further paperwork was completed and sent to vendor.  Patient appears to qualify for manual mobility device at this time per objective findings.   MEDICAL HISTORY: Diagnosis: Primary Diagnosis: incomplete paraplegia Onset: 1998 Diagnosis: ?????   []Progressive Disease Relevant past and future surgeries: Bladder surgery, hernia surgery, RUE surgery    Height: 5' 8" Weight: 220 lb Explain recent changes or trends in weight: none   History including Falls: No falls.  PMH: HTN, pituitary insufficiency, bipolar 2 disorder, depression, hyperlipidemia, multiple suicide attempts     HOME ENVIRONMENT: []House  []Condo/town home  [x]Apartment  []Assisted Living    [x]Lives Alone [] Lives with Others                                                                                          Hours with caregiver: personal care attendant 4 hours a week - helps with cleaning  [x]Home is accessible to patient           Stairs      []Yes [x] No     Ramp [x]Yes []No Comments:  cement ramp up to apartment and then level entry into apartment.  Floors in apartment: carpet in bedroom, rest of apartment is wood floors.  Roll in shower with bench.  Doors wide enough for w/c   COMMUNITY ADL: TRANSPORTATION: []Car    [x]Van    [x]Public Transportation    []Adapted w/c Lift    []Ambulance    []Other:       [x]Sits in wheelchair during transport  Employment/School: N/A Specific requirements pertaining to mobility ?????  Other: Public transportation pt sits in w/c during transport but drivers need push canes to assist pt.  When pt transported by aunt he transfers into a seat in her Lucianne Lei and aunt has to load w/c in back of Lucianne Lei - aunt is elderly and has a hard time lifting current wheelchair    FUNCTIONAL/SENSORY PROCESSING SKILLS:  Handedness:   []Right     []Left    [x]NA  Comments:  ?????  Functional Processing Skills for  Wheeled Mobility [x]Processing Skills are adequate for safe wheelchair operation  Areas of concern than may interfere with safe operation of wheelchair Description of problem   [] Attention to environment      []Judgment      [] Hearing  [] Vision or visual processing      []Motor Planning  []  Fluctuations in Behavior  ?????    VERBAL COMMUNICATION: [x]WFL receptive [x] WFL expressive []Understandable  []Difficult to understand  []non-communicative [] Uses an augmented communication device  CURRENT SEATING / MOBILITY: Current Mobility Base:  []None []Dependent [x]Manual []Scooter []Power  Type of Control: ?????  Manufacturer:  Ti Lite Size:  20 x 18 Age: 43 years in November  Current Condition of Mobility Base:  Poor - tires are worn, side guards held on by tape and missing screws, front casters don't rotate quick release axles malfunction and pt can't remove wheels, brakes don't grab tires to hold when patient transfers     Current Wheelchair components:  Ulice Dash 2 cushion, side guards, tension adjustable back, center mounted fixed foot plate, push to lock brakes, anti-tippers, flip forward back rest    Describe posture in present seating system:  Ulice Dash 2 cushion is bottomed out, seat sling and back are hammocked causing pt to recline posterior with increased thoracic rounding, back is about 4 inches below scapula, R toes hang off end of foot plate in plantarflexion due to inability of R knee to flex to 90      SENSATION and SKIN ISSUES: Sensation []Intact  [x]Impaired []Absent  Level of sensation: decreased to light touch bilat LE; absent sensation in toes Pressure Relief: Able to perform effective pressure relief :    [x]Yes  [] No Method: w/c push ups x 3 minutes, transfer out of w/c If not, Why?: ?????  Skin Issues/Skin Integrity Current Skin Issues  []Yes [x]No []Intact [] Red area[] Open Area  []Scar Tissue [x]At risk from prolonged sitting Where  ?????  History of Skin Issues  []Yes  [x]No Where  ????? When  ?????  Hx of skin flap surgeries  []Yes [x]No Where  ????? When  ?????  Limited sitting tolerance []Yes [x]No Hours spent sitting in wheelchair daily: 8 hours  Complaint of Pain:  Please describe: None   Swelling/Edema: None   ADL STATUS (in reference to wheelchair use):  Indep Assist Unable Indep with Equip Not assessed Comments  Dressing ????? ????? ????? X ????? bed level  Eating ????? ????? ????? X ????? in wheelchair  Toileting ????? ????? ????? X ????? transfer from wheelchair to [facility] for bowel program  Bathing ????? ????? ????? X ????? transfer from wheelchair to bench  Grooming/Hygiene ????? ????? ????? X ????? in wheelchair  Meal Prep ????? ????? ????? X ????? in wheelchair  IADLS X ????? ????? ????? ????? ?????  Bowel Management: [x]Continent  []Incontinent  []Accidents Comments:  Bowel program  Bladder Management: [x]Continent  []Incontinent  []Accidents Comments:  Leg Bag catheter     WHEELCHAIR SKILLS: Manual w/c Propulsion: [x]UE or LE strength and endurance sufficient to participate in ADLs using manual wheelchair Arm : [x]left [x]right   [x]Both      Distance: 500 Foot:  []left []right   []Both  Operate Scooter: [] Strength, hand grip, balance and transfer appropriate for use []Living environment is accessible for use of scooter  Operate Power w/c:  [] Std. Joystick   [] Alternative Controls Indep [] Assist [] Dependent/unable [] N/A []  []Safe          [] Functional      Distance: ?????  Bed confined without wheelchair [x] Yes [] No   STRENGTH/RANGE OF MOTION:  AROM  Range of Motion Strength  Shoulder WFL 5/5  Elbow WFL 5/5  Wrist/Hand WFL 5/5  Hip WFL 2-3/5  Knee R knee flexion to  84 deg (lacking 6 deg to 90); L knee flexion to 90 2-03/5  Ankle mild plantarflexion contracture -lacking 17 deg to neutral DF) on R; 90 deg DF on L 0/5     MOBILITY/BALANCE:  [] Patient is totally dependent for mobility  ?????    Balance  Transfers Ambulation  Sitting Balance: Standing Balance: [x] Independent [] Independent/Modified Independent  [x] [facility]     [] [facility] [] Supervision [] Supervision  [] Uses UE for balance  [] Supervision [] Min Assist [] Ambulates with Assist  ?????    [] Min Assist [] Min assist [] Mod Assist [] Ambulates with Device:      [] RW  [] StW  [] Cane  [] ?????  [] Mod Assist [] Mod assist [] Max assist   [] Max Assist [] Max assist [] Dependent [] Indep. Short Distance Only  [] Unable [x] Unable [] Lift / Sling Required Distance (in feet)  ?????   [] Sliding board [x] Unable to Ambulate (see explanation below)  Cardio Status:  [x]Intact  [] Impaired   [] NA     ?????  Respiratory Status:  [x]Intact   []Impaired   []NA     ?????  Orthotics/Prosthetics: None  Comments (Address manual vs power w/c vs scooter): Pt demonstrates sufficient upper body strength, postural control and sitting balance to propel an ultra lightweight manual wheelchair in order to be independent with MRADL's in his home and community and does not currently require the use of a power wheelchair.  Pt requires ultra lightweight manual w/c for caregiver lifting of wheelchair and for energy and joint conservation due to daily UE propulsion.            Anterior / Posterior Obliquity Rotation-Pelvis ?????  PELVIS    [] [x] []  Neutral Posterior Anterior  [x] [] []  WFL Rt elev Lt elev  [] [x] []  WFL Right Left                      Anterior    Anterior     [] Fixed [] Other [x] Partly Flexible [] Flexible   [] Fixed [] Other [] Partly Flexible  [] Flexible  [] Fixed [] Other [x] Partly Flexible  [] Flexible   TRUNK  [] [x] []  WFL ? Thoracic ? Lumbar  Kyphosis Lordosis  [] [] [x]  WFL Convex Convex  Right Left [x]c-curve []s-curve []multiple  [x] Neutral [] Left-anterior [] Right-anterior     [] Fixed [] Flexible [x] Partly Flexible [] Other  [] Fixed [] Flexible [x] Partly Flexible [] Other  [] Fixed              [] Flexible [] Partly Flexible [] Other    Position Windswept  ?????  HIPS          [x]           []              []   Neutral       Abduct        ADduct         [x]          []           []  Neutral Right           Left      [] Fixed [] Subluxed [] Partly Flexible [] Dislocated []  Flexible  [] Fixed [] Other [] Partly Flexible  [] Flexible                 Foot Positioning Knee Positioning  ?????    [x] WFL  [x]Lt []Rt [x] [facility]  [x]Lt []Rt    KNEES ROM concerns: ROM concerns:    & Dorsi-Flexed []Lt []Rt R knee flexion lacks 6 deg to 90 deg of flexion     FEET Plantar Flexed []Lt [x]Rt      Inversion                 []Lt []Rt      Eversion                 []Lt []Rt     HEAD [x] Functional [x] Good Head Control  ?????  & [] Flexed         [] Extended [] Adequate Head Control    NECK [] Rotated  Lt  [] Lat Flexed Lt [] Rotated  Rt [] Lat Flexed Rt [] Limited Head Control     [] Cervical Hyperextension [] Absent  Head Control     SHOULDERS ELBOWS WRIST& HAND ?????      Left     Right    Left     Right    Left     Right   U/E [x]Functional           [x]Functional [facility] []Fisting             []Fisting      []elev   []dep      []elev   []dep       []pro -[]retract     []pro  []retract []subluxed             []subluxed           Goals for Wheelchair Mobility  [x] Independence with mobility in the home with motor related ADLs (MRADLs)  [x] Independence with MRADLs in the community [] Provide dependent mobility  [] Provide recline     []Provide tilt   Goals for Seating system [x] Optimize pressure distribution [x] Provide support needed to facilitate function or safety [x] Provide corrective forces to assist with maintaining or improving posture [] Accommodate client's posture:   current seated postures and positions are not flexible or will not tolerate corrective forces [x] Client to be independent with relieving pressure in the wheelchair []Enhance physiological function  such as breathing, swallowing, digestion  Simulation ideas/Equipment trials:????? State why other equipment was unsuccessful:Pt is not an functional ambulator with use of cane or RW due to incomplete paraplegia with impaired sensation, impaired strength and decreased joint ROM.   MOBILITY BASE RECOMMENDATIONS and JUSTIFICATION: MOBILITY COMPONENT JUSTIFICATION  Manufacturer: Ki Mobility Model: Rogue   Size: Width 20 Seat Depth 18 [x]provide transport from point A to B      [x]promote Indep mobility  [x]is not a safe, functional ambulator [x]walker or cane inadequate []non-standard width/depth necessary to accommodate anatomical measurement [] ?????  [x]Manual Mobility Base [x]non-functional ambulator    []Scooter/POV  []can safely operate  []can safely transfer   []has adequate trunk stability  []cannot functionally propel manual w/c  []Power Mobility Base  []non-ambulatory  []cannot functionally propel manual wheelchair  [] cannot functionally and safely operate scooter/POV []can safely operate and willing to  []Stroller Base []infant/child  []unable to propel manual wheelchair []allows for growth []non-functional ambulator []non-functional UE []Indep  mobility is not a goal at this time  []Tilt  []Forward []Backward []Powered tilt  []Manual tilt  []change position against gravitational force on head and shoulders  []change position for pressure relief/cannot weight shift []transfers  []management of tone []rest periods []control edema []facilitate postural control  [] ?????  []Recline  []Power recline on power base []Manual recline on manual base  []accommodate femur to back angle  []bring to full recline for ADL care  []change position for pressure relief/cannot weight shift []rest periods []repositioning for transfers or clothing/diaper /catheter changes []head positioning  [x]Lighter weight required [x]self- propulsion  [x]lifting [] ?????  []Heavy Duty required []user  weight greater than 250# []extreme tone/ over active movement []broken frame on previous chair [] ?????  [x] Back  [] Angle Adjustable [] Custom molded Axiom Posterior PB 10" low contour  [x]postural control []control of tone/spasticity []accommodation of range of motion []UE functional control []accommodation for seating system [] ????? []provide lateral trunk support []accommodate deformity [x]provide posterior trunk support [x]provide lumbar/sacral support [x]support trunk in midline []Pressure relief over spinal processes  [x] Seat Cushion Jay 2 [x]impaired sensation  []decubitus ulcers present []history of pressure ulceration []prevent pelvic extension [x]low maintenance  [x]stabilize pelvis  []accommodate obliquity []accommodate multiple deformity []neutralize lower extremity position [x]increase pressure distribution [] ?????  [] Pelvic/thigh support  [] Lateral thigh guide [] Distal medial pad  [] Distal lateral pad [] pelvis in neutral []accommodate pelvis [] position upper legs [] alignment [] accommodate ROM [] decr adduction []accommodate tone []removable for transfers []decr abduction  [] Lateral trunk Supports [] Lt     [] Rt []decrease lateral trunk leaning []control tone []contour for increased contact []safety  []accommodate asymmetry [] ?????  [] Mounting hardware  []lateral trunk supports  [x]back   []seat []headrest      [] thigh support []fixed   []swing away []attach seat platform/cushion to w/c frame [x]attach back cushion to w/c frame []mount postural supports []mount headrest  []swing medial thigh support away []swing lateral supports away for transfers  [] ?????    Armrests  []fixed []adjustable height []removable   []swing away  []flip back   []reclining []full length pads []desk    []pads tubular  []provide support with elbow at 90   []provide support for w/c tray []change of height/angles for variable activities []remove for  transfers []allow to come closer to table top []remove for access to tables [] ?????  Hangers/ Leg rests  []60 []70 [x]90 []elevating []heavy duty  []articulating [x]fixed []lift off []swing away     []power [x]provide LE support  []accommodate to hamstring tightness []elevate legs during recline   []provide change in position for Legs [x]Maintain placement of feet on footplate []durability []enable transfers []decrease edema []Accommodate lower leg length [] ?????  Foot support Footplate    []Lt  [] Rt  [x] Center mount []flip up     [x]depth/angle adjustable []Amputee adapter    [] Lt     [] Rt [x]provide foot support [x]accommodate to ankle ROM []transfers []Provide support for residual extremity [x] allow foot to go under wheelchair base [] decrease tone  [] ?????  [] Ankle strap/heel loops []support foot on foot support []decrease extraneous movement []provide input to heel  []protect foot  Tires: []pneumatic  [x]flat free inserts  []solid  [x]decrease maintenance  [x]prevent frequent flats []increase shock absorbency []decrease pain from road shock []decrease spasms from road shock [] ?????  [] Headrest  []  provide posterior head support []provide posterior neck support []provide lateral head support []provide anterior head support []support during tilt and recline []improve feeding   []improve respiration []placement of switches []safety  []accommodate ROM  []accommodate tone []improve visual orientation  [] Anterior chest strap [] Vest [] Shoulder retractors  []decrease forward movement of shoulder []accommodation of TLSO []decrease forward movement of trunk []decrease shoulder elevation []added abdominal support []alignment []assistance with shoulder control  [] ?????  Pelvic Positioner []Belt []SubASIS bar []Dual Pull []stabilize tone []decrease falling out of chair/ **will not Decr potential for sliding due to pelvic tilting []prevent excessive  rotation []pad for protection over boney prominence []prominence comfort []special pull angle to control rotation [] ?????  Upper Extremity Support []L   [] R []Arm trough    []hand support [] tray       []full tray []swivel mount []decrease edema      []decrease subluxation   []control tone   []placement for AAC/Computer/EADL []decrease gravitational pull on shoulders []provide midline positioning []provide support to increase UE function []provide hand support in natural position []provide work surface   POWER WHEELCHAIR CONTROLS  []Proportional  []Non-Proportional Type ????? []Left  []Right []provides access for controlling wheelchair   []lacks motor control to operate proportional drive control []unable to understand proportional controls  Actuator Control Module  []Single  []Multiple   []Allow the client to operate the power seat function(s) through the joystick control   []Safety Reset Switches []Used to change modes and stop the wheelchair when driving in latch mode    []Upgraded Electronics   []programming for accurate control []progressive Disease/changing condition []non-proportional drive control needed []Needed in order to operate power seat functions through joystick control   []Display box []Allows user to see in which mode and drive the wheelchair is set  []necessary for alternate controls    []Digital interface electronics []Allows w/c to operate when using alternative drive controls  []ASL Head Array []Allows client to operate wheelchair  through switches placed in tri-panel headrest  []Sip and puff with tubing kit []needed to operate sip and puff drive controls  []Upgraded tracking electronics []increase safety when driving []correct tracking when on uneven surfaces  []Mount for switches or joystick []Attaches switches to w/c  []Swing away for access or transfers []midline for optimal placement []provides for consistent access  []Attendant controlled joystick  plus mount []safety []long distance driving []operation of seat functions []compliance with transportation regulations [] ?????    Rear wheel placement/Axle adjustability []None [x]semi adjustable []fully adjustable  [x]improved UE access to wheels [x]improved stability []changing angle in space for improvement of postural stability []1-arm drive access []amputee pad placement [] ?????  Wheel rims/ hand rims  [x]metal  []plastic coated []oblique projections []vertical projections [x]Provide ability to propel manual wheelchair  [] Increase self-propulsion with hand weakness/decreased grasp  Push handles []extended  []angle adjustable  [x]standard [x]caregiver access [x]caregiver assist []allows "hooking" to enable increased ability to perform ADLs or maintain balance  One armed device  []Lt   []Rt []enable propulsion of manual wheelchair with one arm   [] ?????   Brake/wheel lock extension [] Lt   [] Rt []increase indep in applying wheel locks   [x]Side guards [x]prevent clothing getting caught in wheel or becoming soiled [x] prevent skin tears/abrasions  Battery: ????? []to power wheelchair ?????  Other: Anti-tippers Prevent tipping posteriorly on inclines and when negotiating over thresholds ?????  The above equipment has a life- long use expectancy.  Growth and changes in medical and/or functional conditions would be the exceptions. This is to certify that the therapist has no financial relationship with durable medical provider or manufacturer. The therapist will not receive remuneration of any kind for the equipment recommended in this evaluation.   Patient has mobility limitation that significantly impairs safe, timely participation in one or more mobility related ADL's.  (bathing, toileting, feeding, dressing, grooming, moving from room to room)                                                             [x] Yes [] No Will mobility device sufficiently improve ability to participate  and/or be aided in participation of MRADL's?         [x] Yes [] No Can limitation be compensated for with use of a cane or walker?                                                                                [] Yes [x] No Does patient or caregiver demonstrate ability/potential ability & willingness to safely use the mobility device?   [x] Yes [] No Does patient's home environment support use of recommended mobility device?                                                    [x] Yes [] No Does patient have sufficient upper extremity function necessary to functionally propel a manual wheelchair?    [x] Yes [] No Does patient have sufficient strength and trunk stability to safely operate a POV (scooter)?                                  [] Yes [x] No Does patient need additional features/benefits provided by a power wheelchair for MRADL's in the home?       [] Yes [x] No Does the patient demonstrate the ability to safely use a power wheelchair?                                                              [] Yes [x] No  Therapist Name Printed: Rico Junker, PT, DPT Date: 09/10/18  Therapist's Signature:   Date:   Supplier's Name Printed: [employer] Date: 09/10/18  Supplier's Signature:   Date:  Patient/Caregiver Signature:   Date:     This is to certify that I have read this evaluation and do agree with the content within:      Physician's Name Printed: Hoyt Koch, MD  20 Signature:  Date:  This is to certify that I, the above signed therapist have the following affiliations: [] This DME provider [] Manufacturer of recommended equipment [] Patient's long term care facility [x] None of the above     Objective measurements completed on examination: See above findings.       PT Education - 09/10/18 2141    Education Details  process for obtaining new manual w/c, seating recommendations    Person(s) Educated  Patient    Methods  Explanation     Comprehension  Verbalized understanding         Plan - 09/10/18 2142    Clinical Impression Statement  Pt is a 47 year old male referred to Neuro OPPT for evaluation for new ultra lightweight manual wheelchair due to incomplete paraplegia.  Pt's PMH is significant for the following: HTN, pituitary insufficiency, bipolar 2 disorder, depression, hyperlipidemia, multiple suicide attempts: RUE surgery due to suicide attempt and paraplegia from suicide attempt in 1998.  The following deficits were noted during pt's exam: incomplete paraplegia with impaired sensation, impaired posture and impaired LE active and passive ROM that is not adequately accommodated or corrected in patient's current manual wheelchair.  Based on today's evaluation pt appears to qualify for new ultra lightweight manual wheelchair.  See LMN for specific impairments and seating recommendations.     History and Personal Factors relevant to plan of care:  PHM: HTN, pituitary insufficiency, bipolar 2 disorder, depression, hyperlipidemia, multiple suicide attempts: RUE surgery due to suicide attempt and paraplegia from suicide attempt in 1998.  Lives alone and requires manual w/c for independence with ADL and mobility.    Clinical Presentation  Stable    Clinical Presentation due to:  PHM: HTN, pituitary insufficiency, bipolar 2 disorder, depression, hyperlipidemia, multiple suicide attempts: RUE surgery due to suicide attempt and paraplegia from suicide attempt in 1998.  Lives alone and requires manual w/c for independence with ADL and mobility.    Clinical Decision Making  Low    Rehab Potential  Good    PT Frequency  One time visit    PT Duration  Other (comment)   wheelchair eval only   PT Treatment/Interventions  Other (comment);Wheelchair mobility training   wheelchair evaluation   Consulted and Agree with Plan of Care  Patient       Patient will benefit from skilled therapeutic intervention in order to improve the following  deficits and impairments:  Decreased balance, Decreased range of motion, Decreased strength, Impaired sensation, Postural dysfunction  Visit Diagnosis: Paraplegia, incomplete (HCC)  Muscle weakness (generalized)  Other disturbances of skin sensation  Abnormal posture  Stiffness of right knee, not elsewhere classified  Stiffness of right ankle, not elsewhere classified     Problem List Patient Active Problem List   Diagnosis Date Noted  . Urinary bladder stone 08/25/2018  . Impaired fasting blood sugar 09/26/2017  . Routine general medical examination at a health care facility 06/24/2016  . Ventral hernia 05/26/2016  . Pituitary insufficiency ([place]) 03/04/2016  . Hypogonadism male 03/01/2016  . Essential hypertension 09/16/2015  . Hyperlipidemia 09/16/2015  . Paralysis of both lower limbs ([place]) 09/16/2015  . Mood disorder ([place]) 09/16/2015    Rico Junker, PT, DPT 09/10/18    9:51 PM    [facility] [address] [place], [place], [address] Phone: [phone]   Fax:  [phone]  Name: Andre Clements MRN: [id number] Date of Birth: March 25, 1971

## 2018-09-11 DIAGNOSIS — Z960 Presence of urogenital implants: Secondary | ICD-10-CM

## 2018-09-11 DIAGNOSIS — E119 Type 2 diabetes mellitus without complications: Secondary | ICD-10-CM

## 2018-09-11 DIAGNOSIS — Z48816 Encounter for surgical aftercare following surgery on the genitourinary system: Secondary | ICD-10-CM | POA: Diagnosis not present

## 2018-09-11 DIAGNOSIS — G8220 Paraplegia, unspecified: Secondary | ICD-10-CM | POA: Diagnosis not present

## 2018-09-11 DIAGNOSIS — F3181 Bipolar II disorder: Secondary | ICD-10-CM

## 2018-09-11 DIAGNOSIS — N1330 Unspecified hydronephrosis: Secondary | ICD-10-CM | POA: Diagnosis not present

## 2018-09-11 DIAGNOSIS — E785 Hyperlipidemia, unspecified: Secondary | ICD-10-CM

## 2018-09-11 DIAGNOSIS — Z6833 Body mass index (BMI) 33.0-33.9, adult: Secondary | ICD-10-CM

## 2018-09-11 DIAGNOSIS — Z87442 Personal history of urinary calculi: Secondary | ICD-10-CM

## 2018-09-11 DIAGNOSIS — E669 Obesity, unspecified: Secondary | ICD-10-CM

## 2018-09-11 DIAGNOSIS — T1491XS Suicide attempt, sequela: Secondary | ICD-10-CM

## 2018-09-11 DIAGNOSIS — F39 Unspecified mood [affective] disorder: Secondary | ICD-10-CM

## 2018-09-11 DIAGNOSIS — Z9889 Other specified postprocedural states: Secondary | ICD-10-CM

## 2018-09-11 DIAGNOSIS — N3000 Acute cystitis without hematuria: Secondary | ICD-10-CM | POA: Diagnosis not present

## 2018-09-11 DIAGNOSIS — I119 Hypertensive heart disease without heart failure: Secondary | ICD-10-CM

## 2018-09-11 DIAGNOSIS — Z5181 Encounter for therapeutic drug level monitoring: Secondary | ICD-10-CM

## 2018-09-11 DIAGNOSIS — K219 Gastro-esophageal reflux disease without esophagitis: Secondary | ICD-10-CM

## 2018-09-11 DIAGNOSIS — Z7984 Long term (current) use of oral hypoglycemic drugs: Secondary | ICD-10-CM

## 2018-09-14 DIAGNOSIS — Z87442 Personal history of urinary calculi: Secondary | ICD-10-CM | POA: Diagnosis not present

## 2018-09-14 DIAGNOSIS — Z5181 Encounter for therapeutic drug level monitoring: Secondary | ICD-10-CM | POA: Diagnosis not present

## 2018-09-14 DIAGNOSIS — Z7984 Long term (current) use of oral hypoglycemic drugs: Secondary | ICD-10-CM | POA: Diagnosis not present

## 2018-09-14 DIAGNOSIS — N3000 Acute cystitis without hematuria: Secondary | ICD-10-CM | POA: Diagnosis not present

## 2018-09-14 DIAGNOSIS — E785 Hyperlipidemia, unspecified: Secondary | ICD-10-CM | POA: Diagnosis not present

## 2018-09-14 DIAGNOSIS — Z9889 Other specified postprocedural states: Secondary | ICD-10-CM | POA: Diagnosis not present

## 2018-09-14 DIAGNOSIS — Z960 Presence of urogenital implants: Secondary | ICD-10-CM | POA: Diagnosis not present

## 2018-09-14 DIAGNOSIS — K219 Gastro-esophageal reflux disease without esophagitis: Secondary | ICD-10-CM | POA: Diagnosis not present

## 2018-09-14 DIAGNOSIS — N1330 Unspecified hydronephrosis: Secondary | ICD-10-CM | POA: Diagnosis not present

## 2018-09-14 DIAGNOSIS — I119 Hypertensive heart disease without heart failure: Secondary | ICD-10-CM | POA: Diagnosis not present

## 2018-09-14 DIAGNOSIS — E119 Type 2 diabetes mellitus without complications: Secondary | ICD-10-CM | POA: Diagnosis not present

## 2018-09-14 DIAGNOSIS — G8220 Paraplegia, unspecified: Secondary | ICD-10-CM | POA: Diagnosis not present

## 2018-09-25 ENCOUNTER — Telehealth: Payer: Self-pay | Admitting: Internal Medicine

## 2018-09-25 DIAGNOSIS — G8220 Paraplegia, unspecified: Secondary | ICD-10-CM

## 2018-09-25 NOTE — Telephone Encounter (Signed)
Does he need any particular kind, what does rx need to say? Order placed in epic, if not sufficient then we need more details.

## 2018-09-25 NOTE — Telephone Encounter (Signed)
Sent a message to [place] from [facility] and told him to let us know if we need more information

## 2018-09-25 NOTE — Telephone Encounter (Signed)
Copied from [place] [phone]. Topic: General - Other >> Sep 25, 2018  9:49 AM Yvette Rack wrote: Reason for CRM: pt calling stating that [facility] would need a order for a manual wheelchair RX he was evaluated on 09-10-18 [facility] (P) [phone] (F) [phone]

## 2018-09-27 NOTE — Telephone Encounter (Signed)
LVM for patient informing him that it has been sent to [facility] already and I have sent a message to someone there to inform them that is was sent. Told patient to call back if they have not received the Rx.

## 2018-09-27 NOTE — Telephone Encounter (Signed)
Pt would like to know if he needs an appointment before he can receive wheelchair.

## 2018-09-28 NOTE — Progress Notes (Signed)
Subjective:   Andre Clements is a 47 y.o. male who presents for Medicare Annual/Subsequent preventive examination.  Review of Systems:  No ROS.  Medicare Wellness Visit. Additional risk factors are reflected in the social history.  Cardiac Risk Factors include: advanced age (>59men, >71 women);dyslipidemia;male gender;hypertension Sleep patterns: feels rested on waking and sleeps 7-8 hours nightly.    Home Safety/Smoke Alarms: Feels safe in home. Smoke alarms in place.  Living environment; residence and Adult nurse: apartment, equipment: Hygiene Aides, Type: Commode and wheelchair, no firearms. Lives alone, no needs for DME, limitied support system. Lives in Handicap converted apartment.  Seat Belt Safety/Bike Helmet: Wears seat belt.     Objective:    Vitals: BP (!) 148/84   Pulse 62   Resp 17   Ht 5\' 8"  (1.727 m)   Wt 223 lb (101.2 kg)   SpO2 99%   BMI 33.91 kg/m   Body mass index is 33.91 kg/m.  Advanced Directives 10/01/2018 09/10/2018 08/25/2018  Does Patient Have a Medical Advance Directive? No No No  Does patient want to make changes to medical advance directive? Yes (ED - Information included in AVS) - -  Would patient like information on creating a medical advance directive? - - Yes (ED - Information included in AVS)    Tobacco Social History   Tobacco Use  Smoking Status Never Smoker  Smokeless Tobacco Never Used     Counseling given: Not Answered  Past Medical History:  Diagnosis Date  . Bipolar 2 disorder ([place])   . Blood in stool   . Depression   . Hyperlipidemia   . Hypertension   . Urine incontinence    Past Surgical History:  Procedure Laterality Date  . ABDOMINAL HERNIA REPAIR  2003  . arm surgery Right    from suicide attempt  . CYSTOSCOPY WITH LITHOLAPAXY N/A 08/26/2018   Procedure: CYSTOSCOPY WITH LITHOLAPAXY;  Surgeon: Ceasar Mons, MD;  Location: [facility];  Service: Urology;  Laterality: N/A;   Family History  Problem  Relation Age of Onset  . Alcohol abuse Mother   . Hypertension Mother   . Alcohol abuse Father   . Other Neg Hx        hypogonadism   Social History   Socioeconomic History  . Marital status: Single    Spouse name: Not on file  . Number of children: Not on file  . Years of education: Not on file  . Highest education level: Not on file  Occupational History  . Not on file  Social Needs  . Financial resource strain: Somewhat hard  . Food insecurity:    Worry: Never true    Inability: Never true  . Transportation needs:    Medical: No    Non-medical: No  Tobacco Use  . Smoking status: Never Smoker  . Smokeless tobacco: Never Used  Substance and Sexual Activity  . Alcohol use: No    Alcohol/week: 0.0 standard drinks  . Drug use: No  . Sexual activity: Not Currently  Lifestyle  . Physical activity:    Days per week: 5 days    Minutes per session: 30 min  . Stress: Not at all  Relationships  . Social connections:    Talks on phone: More than three times a week    Gets together: Twice a week    Attends religious service: Not on file    Active member of club or organization: Not on file    Attends meetings  of clubs or organizations: Not on file    Relationship status: Not on file  Other Topics Concern  . Not on file  Social History Narrative  . Not on file    Outpatient Encounter Medications as of 10/01/2018  Medication Sig  . Ascorbic Acid (VITAMIN C) 250 MG CHEW Chew 500 each by mouth daily.   . Cholecalciferol (VITAMIN D3) 5000 units CAPS Take 5,000 Units by mouth daily.   . ferrous sulfate (IRON SUPPLEMENT) 325 (65 FE) MG tablet Take 325 mg by mouth daily.   . haloperidol (HALDOL) 5 MG tablet Take 1 tablet (5 mg total) by mouth daily. (Patient taking differently: Take 5 mg by mouth every other day. )  . lisinopril (PRINIVIL,ZESTRIL) 20 MG tablet TAKE 1 TABLET BY MOUTH  DAILY  . metFORMIN (GLUCOPHAGE-XR) 500 MG 24 hr tablet Take 1 tablet (500 mg total) by mouth  daily with breakfast.  . Multiple Vitamins-Minerals (CENTRUM VITAMINTS PO) Take 1 tablet by mouth daily.  Marland Kitchen omeprazole (PRILOSEC) 40 MG capsule Take 1 capsule (40 mg total) by mouth daily.  . sertraline (ZOLOFT) 100 MG tablet Take 2 tablets (200 mg total) by mouth daily. Two tablets by mouth daily  . simvastatin (ZOCOR) 40 MG tablet Take 1 tablet (40 mg total) by mouth daily at 6 PM.  . [DISCONTINUED] oxybutynin (DITROPAN) 5 MG tablet Take 1 tablet (5 mg total) by mouth every 8 (eight) hours as needed for bladder spasms. (Patient not taking: Reported on 10/01/2018)  . [DISCONTINUED] phenazopyridine (PYRIDIUM) 200 MG tablet Take 1 tablet (200 mg total) by mouth 3 (three) times daily as needed (for pain with urination). (Patient not taking: Reported on 10/01/2018)   No facility-administered encounter medications on file as of 10/01/2018.     Activities of Daily Living In your present state of health, do you have any difficulty performing the following activities: 10/01/2018 08/25/2018  Hearing? N -  Vision? N -  Difficulty concentrating or making decisions? N -  Walking or climbing stairs? N -  Comment - -  Dressing or bathing? N -  Doing errands, shopping? N N  Comment - patient normally uses [employer] bus  Conservation officer, nature and eating ? N -  Using the Toilet? N -  In the past six months, have you accidently leaked urine? N -  Do you have problems with loss of bowel control? N -  Managing your Medications? N -  Managing your Finances? N -  Housekeeping or managing your Housekeeping? N -  Some recent data might be hidden    Patient Care Team: Hoyt Koch, MD as PCP - General (Internal Medicine)   Assessment:   This is a routine wellness examination for [place]. Physical assessment deferred to PCP.   Exercise Activities and Dietary recommendations Current Exercise Habits: Home exercise routine, Type of exercise: strength training/weights, Time (Minutes): 45, Frequency (Times/Week):  5, Weekly Exercise (Minutes/Week): 225, Intensity: Mild, Exercise limited by: Other - see comments(paraplegia)  Diet (meal preparation, eat out, water intake, caffeinated beverages, dairy products, fruits and vegetables): in general, a "healthy" diet   Reports that it is hard to eat healthy on a limited budget.   Reviewed heart healthy diet. Encouraged patient to increase daily water and healthy fluid intake. Diet education for tips of how to eat healthy on a limited budget was attached to patient's AVS.  Goals    . Patient Stated     Maintain current health status.  Depression Screen PHQ 2/9 Scores 10/01/2018  PHQ - 2 Score 0  PHQ- 9 Score 0    Cognitive Function       Ad8 score reviewed for issues:  Issues making decisions: no  Less interest in hobbies / activities: no  Repeats questions, stories (family complaining): no  Trouble using ordinary gadgets (microwave, computer, phone):no  Forgets the month or year: no  Mismanaging finances: no  Remembering appts: no  Daily problems with thinking and/or memory: no Ad8 score is= 0  Immunization History  Administered Date(s) Administered  . Influenza,inj,Quad PF,6+ Mos 09/16/2015, 09/27/2016, 09/26/2017, 09/03/2018  . Pneumococcal Polysaccharide-23 12/15/2015    Screening Tests Health Maintenance  Topic Date Due  . TETANUS/TDAP  09/04/2019 (Originally 08/14/1992)  . INFLUENZA VACCINE  Completed  . HIV Screening  Completed        Plan:     Continue doing brain stimulating activities (puzzles, reading, adult coloring books, staying active) to keep memory sharp.   Continue to eat heart healthy diet (full of fruits, vegetables, whole grains, lean protein, water--limit salt, fat, and sugar intake) and increase physical activity as tolerated.  I have personally reviewed and noted the following in the patient's chart:   . Medical and social history . Use of alcohol, tobacco or illicit drugs  . Current  medications and supplements . Functional ability and status . Nutritional status . Physical activity . Advanced directives . List of other physicians . Vitals . Screenings to include cognitive, depression, and falls . Referrals and appointments  In addition, I have reviewed and discussed with patient certain preventive protocols, quality metrics, and best practice recommendations. A written personalized care plan for preventive services as well as general preventive health recommendations were provided to patient.     Michiel Cowboy, RN  10/01/2018

## 2018-10-01 ENCOUNTER — Telehealth: Payer: Self-pay | Admitting: *Deleted

## 2018-10-01 ENCOUNTER — Ambulatory Visit (INDEPENDENT_AMBULATORY_CARE_PROVIDER_SITE_OTHER): Payer: Medicare Other | Admitting: *Deleted

## 2018-10-01 VITALS — BP 148/84 | HR 62 | Resp 17 | Ht 68.0 in | Wt 223.0 lb

## 2018-10-01 DIAGNOSIS — Z0000 Encounter for general adult medical examination without abnormal findings: Secondary | ICD-10-CM

## 2018-10-01 DIAGNOSIS — F39 Unspecified mood [affective] disorder: Secondary | ICD-10-CM

## 2018-10-01 NOTE — Telephone Encounter (Signed)
During AWV, patient stated that he is experiencing slurred speech as a side effect from taking Haldol and wanted to inform PCP.  Patient has an appointment with PCP 10/04/18 and would like to address this topic too if possible.

## 2018-10-01 NOTE — Telephone Encounter (Signed)
Referral placed to psychiatry. We discussed previously that I would prescribe this as long as changes were not needed. He can meet with me but we will likely not make changes to this as I am not sure why he is taking.

## 2018-10-01 NOTE — Patient Instructions (Addendum)
Continue doing brain stimulating activities (puzzles, reading, adult coloring books, staying active) to keep memory sharp.   Continue to eat heart healthy diet (full of fruits, vegetables, whole grains, lean protein, water--limit salt, fat, and sugar intake) and increase physical activity as tolerated.   [name] , Thank you for taking time to come for your Medicare Wellness Visit. I appreciate your ongoing commitment to your health goals. Please review the following plan we discussed and let me know if I can assist you in the future.   These are the goals we discussed: Goals    . Patient Stated     Maintain current health status.        This is a list of the screening recommended for you and due dates:  Health Maintenance  Topic Date Due  . Tetanus Vaccine  09/04/2019*  . Flu Shot  Completed  . HIV Screening  Completed  *Topic was postponed. The date shown is not the original due date.     Eating Healthy on a Budget There are many ways to save money at the grocery store and continue to eat healthy. You can be successful if you plan your meals according to your budget, purchase according to your budget and grocery list, and prepare food yourself. How can I buy more food on a limited budget? Plan  Plan meals and snacks according to a grocery list and budget you create.  Look for recipes where you can cook once and make enough food for two meals.  Include meals that will "stretch" more expensive foods such as stews, casseroles, and stir-fry dishes.  Make a grocery list and make sure to bring it with you to the store. If you have a smart phone, you could use your phone to create your shopping list. Purchase  When grocery shopping, buy only the items on your grocery list and go only to the areas of the store that have the items on your list. Prepare  Some meal items can be prepared in advance. Pre-cook on days when you have extra time.  Make extra food (such as by doubling  recipes) and freeze the extras in meal-sized containers or in individual portions for fast meals and snacks.  Use leftovers in your meal plan for the week.  Try some meatless meals or try "no cook" meals like salads.  When you come home from the grocery store, wash and prepare your fruits and vegetables so they are ready to use and eat. This will help reduce food waste. How can I buy more food on a limited budget? Try these tips the next time you go shopping:  [place] store brands or generic brands.  Use coupons only for foods and brands you normally buy. Avoid buying items you wouldn't normally buy simply because they are on sale.  Check online and in newspapers for weekly deals.  Buy healthy items from the bulk bins when available, such as herbs, spices, flours, pastas, nuts, and dried fruit.  Buy fruits and vegetables that are in season. Prices are usually lower on in-season produce.  Compare and contrast different items. You can do this by looking at the unit price on the price tag. Use it to compare different brands and sizes to find out which item is the best deal.  Choose naturally low-cost healthy items, such as carrots, potatoes, apples, bananas, and oranges. Dried or canned beans are a low-cost protein source.  Buy in bulk and freeze extra food. Items you can  buy in bulk include meats, fish, poultry, frozen fruits, and frozen vegetables.  Limit the purchase of prepared or "ready-to-eat" foods, such as pre-cut fruits and vegetables and pre-made salads.  If possible, shop around to discover which grocery store offers the best prices. Some stores charge much more than other stores for the same items.  Do not shop when you are hungry. If you shop while hungry, It may be hard to stick to your list and budget.  Stick to your list and resist impulse buys. Treat your list as your official plan for the week.  Buy a variety of vegetables and fruit by purchasing fresh, frozen, and canned  items.  Look beyond eye level. Foods at eye level (adult or child eye level) are more expensive. Look at the top and bottom shelves for deals.  Be efficient with your time when shopping. The more time you spend at the store, the more money you are likely to spend.  Consider other retailers such as dollar stores, larger [employer], local fruit and vegetable stands, and farmers markets.  What are some tips for less expensive food substitutions? When choosing more expensive foods like meats and dairy, try these tips to save money:  Choose cheaper cuts of meat, such as bone-in chicken thighs and drumsticks instead skinless and boneless chicken. When you are ready to prepare the chicken, you can remove the skin yourself to make it healthier.  Choose lean meats like chicken or [place]. When choosing ground beef, make sure it is lean ground beef (92% lean, 8% fat). If you do buy a fattier ground beef, drain the fat before eating.  Buy dried beans and peas, such as lentils, split peas, or kidney beans.  For seafood, choose canned tuna, salmon, or sardines.  Eggs are a low-cost source of protein.  Buy the larger tubs of yogurt instead of individual-sized containers.  Choose water instead of sodas and other sweetened beverages.  Skip buying chips, cookies, and other "junk food". These items are usually expensive, high in calories, and low in nutritional value.  How can I prepare the foods I buy in the healthiest way? Practice these tips for cooking foods in the healthiest way to reduce excess fat and calorie intake:  Steam, saute, grill, or bake foods instead of frying them.  Make sure half your plate is filled with fruits or vegetables. Choose from fresh, frozen, or canned fruits and vegetables. If eating canned, remember to rinse them before eating. This will remove any excess salt added for packaging.  Trim all fat from meat before cooking. Remove the skin from chicken or  [place].  Spoon off fat from meat dishes once they have been chilled in the refrigerator and the fat has hardened on the top.  Use skim milk, low-fat milk, or evaporated skim milk when making cream sauces, soups, or puddings.  Substitute low-fat yogurt, sour cream, or cottage cheese for sour cream and mayonnaise in dips and dressings.  Try lemon juice, herbs, or spices to season food instead of salt, butter, or margarine.  This information is not intended to replace advice given to you by your health care provider. Make sure you discuss any questions you have with your health care provider. Document Released: 08/08/2014 Document Revised: 06/24/2016 Document Reviewed: 07/08/2014 Elsevier Interactive Patient Education  [employer].

## 2018-10-01 NOTE — Progress Notes (Signed)
Medical screening examination/treatment/procedure(s) were performed by non-physician practitioner and as supervising physician I was immediately available for consultation/collaboration. I agree with above. Elizabeth A Crawford, MD 

## 2018-10-02 ENCOUNTER — Telehealth: Payer: Self-pay | Admitting: *Deleted

## 2018-10-02 ENCOUNTER — Ambulatory Visit: Payer: Self-pay | Admitting: Internal Medicine

## 2018-10-02 NOTE — Telephone Encounter (Signed)
Called patient and informed him that PCP placed a referral to psychiatry to assists with patient's side effects of Haldol. Patient verbalized understanding.

## 2018-10-04 ENCOUNTER — Encounter: Payer: Self-pay | Admitting: Internal Medicine

## 2018-10-04 ENCOUNTER — Ambulatory Visit (INDEPENDENT_AMBULATORY_CARE_PROVIDER_SITE_OTHER): Payer: Medicare Other | Admitting: Internal Medicine

## 2018-10-04 DIAGNOSIS — G8220 Paraplegia, unspecified: Secondary | ICD-10-CM | POA: Diagnosis not present

## 2018-10-04 NOTE — Assessment & Plan Note (Signed)
Evaluation from PT and concurrent with MD that patient does qualify for and would benefit from ultra lightweight manual wheelchair. This is ordered today and paperwork completed and signed. This will be [facility]ed back to [facility] will [facility] his wheelchair. Need is lifetime.

## 2018-10-04 NOTE — Progress Notes (Signed)
   Subjective:    Patient ID: Andre Clements, male    DOB: 1971/04/05, 47 y.o.   MRN: [id number]  HPI The patient is a 47 YO man coming in for mobility assessment for ultra-light weight wheelchair evaluation. This is the primary reason for the visit.   The patient is a 47 YO man with paralysis who cannot use cane or walker to aid in mobility.   I have read and agree with PT evaluation 09/10/18 of the patient.   This ultra-lightweight manual wheelchair will allow the patient to be independent in mobility including dressing, bathing, shopping, cooking, cleaning, toileting. Without this mobility aid the patient would be dependent for all these ADLs and IADLs.  He has the physical and mental capacity to use this manual wheelchair without difficulty.   Review of Systems  Constitutional: Negative.   HENT: Negative.   Eyes: Negative.   Respiratory: Negative for cough, chest tightness and shortness of breath.   Cardiovascular: Negative for chest pain, palpitations and leg swelling.  Gastrointestinal: Negative for abdominal distention, abdominal pain, constipation, diarrhea, nausea and vomiting.  Musculoskeletal: Negative.   Skin: Negative.   Neurological: Positive for weakness.  Psychiatric/Behavioral: Negative.       Objective:   Physical Exam  Constitutional: He is oriented to person, place, and time. He appears well-developed and well-nourished.  HENT:  Head: Normocephalic and atraumatic.  Eyes: EOM are normal.  Neck: Normal range of motion.  Cardiovascular: Normal rate and regular rhythm.  Pulmonary/Chest: Effort normal and breath sounds normal. No respiratory distress. He has no wheezes. He has no rales.  Abdominal: Soft. Bowel sounds are normal. He exhibits no distension. There is no tenderness. There is no rebound.  Musculoskeletal: He exhibits no edema.  Neurological: He is alert and oriented to person, place, and time. A cranial nerve deficit is present. Coordination normal.    Paralysis bilateral lower extremities.   Skin: Skin is warm and dry.  Psychiatric: He has a normal mood and affect.   Vitals:   10/04/18 1312  BP: 138/90  Pulse: 88  Temp: 98.1 F (36.7 C)  TempSrc: Oral  SpO2: 97%  Weight: 229 lb (103.9 kg)  Height: 5\' 8"  (1.727 m)      Assessment & Plan:  Visit time 25 minutes: greater than 50% of that time was spent in face to face counseling and coordination of care with the patient: counseled about mobility, duration of mobility problems, review of PT evaluation and repeat evaluation of the patient in the office.

## 2018-10-05 ENCOUNTER — Telehealth: Payer: Self-pay

## 2018-10-05 NOTE — Telephone Encounter (Signed)
Noted  

## 2018-10-05 NOTE — Telephone Encounter (Signed)
Copied from [place] [id number]. Topic: General - Other >> Oct 05, 2018  9:48 AM Andre Clements R wrote: Patient called in and stated his order for his manual wheelchair that was sent by [facility]  needs to be filled out asap and faxed back to [facility]

## 2018-10-05 NOTE — Telephone Encounter (Signed)
Contacted patient to inform him I have faxed the forms yesterday, patient stated that they had not received them. I have resent the forms and called debbie who is handling his case and LVM to let us know if she has received the fax for the wheelchair. I have also sent a community message to [place] from [facility] to see if he could help in any way

## 2018-10-05 NOTE — Telephone Encounter (Signed)
Andre Clements with [facility] called in to confirm that they have received the fax and have everything that they need for pt.

## 2018-10-24 ENCOUNTER — Telehealth: Payer: Self-pay

## 2018-10-24 NOTE — Telephone Encounter (Signed)
Copied from [place] [id number]. Topic: General - Other >> Oct 05, 2018  9:48 AM Alfredia Ferguson R wrote: Patient called in and stated his order for his manual wheelchair that was sent by [facility]  needs to be filled out asap and faxed back to [facility] >> Oct 24, 2018  1:33 PM Yvette Rack wrote: Pt calling to see if papers was sent to [place] for prior authorization for his wheel chair please give him a call back at [phone] he would like a call back today his insurance hasn't heard anything from his provider

## 2018-10-24 NOTE — Telephone Encounter (Signed)
Called Debbie at [facility] to ask if there was anything else she needed from [place] in order to help get patient the wheelchair and/or if we needed to refax anything. She will call back tomorrow and let us know. I also talked to the patient informing him of what is going on and gave him Debbie's number per her request.

## 2018-10-25 NOTE — Telephone Encounter (Signed)
Debbie calling back.  They have everything needed from Dr. Sharlet Salina for pt's power wheelchair.  Pt is in their review process to submit to insurance. Jackelyn Poling will be in touch with the pt. Jackelyn Poling can be reached at [phone]

## 2018-10-25 NOTE — Telephone Encounter (Signed)
Noted  

## 2018-10-31 NOTE — Telephone Encounter (Signed)
talked to debbie from [facility] about this and they have already sent this information to insurance and patient is currently in pending status

## 2018-10-31 NOTE — Telephone Encounter (Signed)
Sharmane from [facility] calling regarding prior auth for patients wheelchair.  Sharmane [employer] cb# [phone] ext [phone]

## 2018-11-19 NOTE — Telephone Encounter (Signed)
Patient calling regarding wheelchair-Patient states that certain parts of the chair will not be covered. Patient wants to know if he can switch companies, stating that he cannot get in touch with anyone at [facility] and is getting frustrated. Patient is requesting a call back to discuss further. Please advise.

## 2018-11-20 NOTE — Telephone Encounter (Signed)
Until he actually gets the chair he can switch companies but generally the paperwork process would have to start all over again. He would not need a new visit with [place] but they would likely be starting the process again. He can find another company if he wants.

## 2018-11-20 NOTE — Telephone Encounter (Signed)
Called patient back and gave MD response patient asked if I could call to [facility] because he has not been able to get in touch with them and all he wants to do is go over a letter he got from his insurance about what parts he has to pay out of pocket. I got a hold of Andria Rhein and asked if she could give him a call in regard to that letter. Jackelyn Poling stated that she would call the patient.

## 2019-01-01 DIAGNOSIS — S34109A Unspecified injury to unspecified level of lumbar spinal cord, initial encounter: Secondary | ICD-10-CM | POA: Diagnosis not present

## 2019-01-01 DIAGNOSIS — G8220 Paraplegia, unspecified: Secondary | ICD-10-CM | POA: Diagnosis not present

## 2019-01-02 ENCOUNTER — Other Ambulatory Visit: Payer: Self-pay | Admitting: Internal Medicine

## 2019-01-02 DIAGNOSIS — E782 Mixed hyperlipidemia: Secondary | ICD-10-CM

## 2019-01-02 DIAGNOSIS — E119 Type 2 diabetes mellitus without complications: Secondary | ICD-10-CM

## 2019-01-02 DIAGNOSIS — F251 Schizoaffective disorder, depressive type: Secondary | ICD-10-CM

## 2019-01-22 ENCOUNTER — Emergency Department (HOSPITAL_COMMUNITY)
Admission: EM | Admit: 2019-01-22 | Discharge: 2019-01-22 | Disposition: A | Discharge status: Home / Self Care | Payer: Medicare Other | Attending: Emergency Medicine | Admitting: Emergency Medicine

## 2019-01-22 ENCOUNTER — Other Ambulatory Visit: Payer: Self-pay

## 2019-01-22 ENCOUNTER — Emergency Department (HOSPITAL_COMMUNITY): Payer: Medicare Other

## 2019-01-22 DIAGNOSIS — S80919A Unspecified superficial injury of unspecified knee, initial encounter: Secondary | ICD-10-CM | POA: Diagnosis not present

## 2019-01-22 DIAGNOSIS — Y939 Activity, unspecified: Secondary | ICD-10-CM | POA: Diagnosis not present

## 2019-01-22 DIAGNOSIS — M25551 Pain in right hip: Secondary | ICD-10-CM | POA: Insufficient documentation

## 2019-01-22 DIAGNOSIS — S8991XA Unspecified injury of right lower leg, initial encounter: Secondary | ICD-10-CM | POA: Diagnosis not present

## 2019-01-22 DIAGNOSIS — I10 Essential (primary) hypertension: Secondary | ICD-10-CM | POA: Insufficient documentation

## 2019-01-22 DIAGNOSIS — E785 Hyperlipidemia, unspecified: Secondary | ICD-10-CM | POA: Insufficient documentation

## 2019-01-22 DIAGNOSIS — S92411A Displaced fracture of proximal phalanx of right great toe, initial encounter for closed fracture: Secondary | ICD-10-CM | POA: Diagnosis not present

## 2019-01-22 DIAGNOSIS — S92414A Nondisplaced fracture of proximal phalanx of right great toe, initial encounter for closed fracture: Secondary | ICD-10-CM | POA: Diagnosis not present

## 2019-01-22 DIAGNOSIS — Y929 Unspecified place or not applicable: Secondary | ICD-10-CM | POA: Insufficient documentation

## 2019-01-22 DIAGNOSIS — W19XXXA Unspecified fall, initial encounter: Secondary | ICD-10-CM | POA: Diagnosis not present

## 2019-01-22 DIAGNOSIS — Y998 Other external cause status: Secondary | ICD-10-CM | POA: Insufficient documentation

## 2019-01-22 DIAGNOSIS — M25561 Pain in right knee: Secondary | ICD-10-CM

## 2019-01-22 DIAGNOSIS — R52 Pain, unspecified: Secondary | ICD-10-CM | POA: Diagnosis not present

## 2019-01-22 DIAGNOSIS — Z79899 Other long term (current) drug therapy: Secondary | ICD-10-CM | POA: Insufficient documentation

## 2019-01-22 DIAGNOSIS — S79911A Unspecified injury of right hip, initial encounter: Secondary | ICD-10-CM | POA: Diagnosis not present

## 2019-01-22 DIAGNOSIS — R609 Edema, unspecified: Secondary | ICD-10-CM | POA: Diagnosis not present

## 2019-01-22 NOTE — Discharge Instructions (Addendum)
1. Medications: Alternate 600 mg of ibuprofen and [phone] mg of Tylenol every 4-6 hours as needed for pain. Do not exceed 4000 mg of Tylenol daily.  Take ibuprofen with food to avoid upset stomach issues.  2. Treatment: rest, ice, elevate and use brace, drink plenty of fluids, gentle stretching 3. Follow Up: Please followup with orthopedics as directed or your PCP in 1 week for discussion of your diagnoses and further evaluation after today's visit; if you do not have a primary care doctor use the resource guide provided to find one; Please return to the ER for worsening symptoms or other concerns such as worsening swelling, redness of the skin, fevers, loss of pulses, or loss of feeling

## 2019-01-22 NOTE — ED Notes (Signed)
Ortho paged for post-op shoe (none in department).
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
## 2019-01-22 NOTE — Progress Notes (Signed)
Orthopedic Tech Progress Note Patient Details:  Andre Clements 1971-11-01 [id number]  Ortho Devices Type of Ortho Device: Postop shoe/boot Ortho Device/Splint Interventions: Application   Post Interventions Patient Tolerated: Well Instructions Provided: Care of device   [name] 01/22/2019, 12:15 PM

## 2019-01-22 NOTE — ED Notes (Signed)
Patient verbalized understanding of discharge instructions and denies any further needs or questions at this time. VS stable. Patient assisted into mobility scooter and to ED lobby.

## 2019-01-29 ENCOUNTER — Encounter: Payer: Self-pay | Admitting: Internal Medicine

## 2019-01-29 ENCOUNTER — Ambulatory Visit (INDEPENDENT_AMBULATORY_CARE_PROVIDER_SITE_OTHER): Payer: Medicare Other | Admitting: Internal Medicine

## 2019-01-29 ENCOUNTER — Ambulatory Visit: Payer: Self-pay | Admitting: Internal Medicine

## 2019-01-29 VITALS — BP 140/90 | HR 83 | Temp 98.4°F | Ht 68.0 in | Wt 233.0 lb

## 2019-01-29 DIAGNOSIS — L659 Nonscarring hair loss, unspecified: Secondary | ICD-10-CM

## 2019-01-29 DIAGNOSIS — M25561 Pain in right knee: Secondary | ICD-10-CM | POA: Insufficient documentation

## 2019-01-29 MED ORDER — FINASTERIDE 5 MG PO TABS: 5.0000 mg | ORAL_TABLET | Freq: Every day | ORAL | 3 refills | Status: DC

## 2019-01-29 NOTE — Patient Instructions (Signed)
We have sent in the finasteride to take 1 pill daily to help the hair.
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
## 2019-01-29 NOTE — Assessment & Plan Note (Signed)
Wants to try finasteride and rx sent in today and advise given about potential side effects.

## 2019-01-29 NOTE — Assessment & Plan Note (Signed)
Is about 90% resolved at this time and he is advised that he can take tylenol if needed for pain.

## 2019-02-19 ENCOUNTER — Ambulatory Visit: Payer: Self-pay | Admitting: Internal Medicine

## 2019-03-13 ENCOUNTER — Encounter: Payer: Self-pay | Admitting: Internal Medicine

## 2019-03-13 NOTE — Progress Notes (Signed)
Abstracted and sent to scan  

## 2019-05-01 ENCOUNTER — Other Ambulatory Visit: Payer: Self-pay

## 2019-05-01 NOTE — Patient Outreach (Signed)
[facility]) Care Management  05/01/2019  JUWAN VENCES 1971-05-01 [id number]   Medication Adherence call to Mr. Elgin Carn HIPPA Compliant Voice message left with a call back number. Mr. Hosick is showing past due on Metformin Er 500 mg and Simvastatin 40 mg under [place].  [facility] Direct Dial [phone]  Fax [phone] [email or web address]

## 2019-05-20 ENCOUNTER — Other Ambulatory Visit: Payer: Self-pay | Admitting: Internal Medicine

## 2019-05-20 DIAGNOSIS — I10 Essential (primary) hypertension: Secondary | ICD-10-CM

## 2019-06-23 ENCOUNTER — Other Ambulatory Visit: Payer: Self-pay | Admitting: Internal Medicine

## 2019-06-23 DIAGNOSIS — E782 Mixed hyperlipidemia: Secondary | ICD-10-CM

## 2019-06-23 DIAGNOSIS — F251 Schizoaffective disorder, depressive type: Secondary | ICD-10-CM

## 2019-06-23 DIAGNOSIS — E119 Type 2 diabetes mellitus without complications: Secondary | ICD-10-CM

## 2019-07-03 DIAGNOSIS — E119 Type 2 diabetes mellitus without complications: Secondary | ICD-10-CM | POA: Diagnosis not present

## 2019-07-03 DIAGNOSIS — Z0100 Encounter for examination of eyes and vision without abnormal findings: Secondary | ICD-10-CM | POA: Diagnosis not present

## 2019-07-19 ENCOUNTER — Encounter: Payer: Self-pay | Admitting: Internal Medicine

## 2019-07-19 ENCOUNTER — Other Ambulatory Visit: Payer: Self-pay

## 2019-07-19 ENCOUNTER — Other Ambulatory Visit (INDEPENDENT_AMBULATORY_CARE_PROVIDER_SITE_OTHER): Payer: Medicare Other

## 2019-07-19 ENCOUNTER — Ambulatory Visit (INDEPENDENT_AMBULATORY_CARE_PROVIDER_SITE_OTHER): Payer: Medicare Other | Admitting: Internal Medicine

## 2019-07-19 VITALS — BP 138/98 | HR 85 | Temp 98.7°F | Ht 68.0 in | Wt 236.0 lb

## 2019-07-19 DIAGNOSIS — F39 Unspecified mood [affective] disorder: Secondary | ICD-10-CM

## 2019-07-19 DIAGNOSIS — E782 Mixed hyperlipidemia: Secondary | ICD-10-CM

## 2019-07-19 DIAGNOSIS — R7301 Impaired fasting glucose: Secondary | ICD-10-CM | POA: Diagnosis not present

## 2019-07-19 DIAGNOSIS — N210 Calculus in bladder: Secondary | ICD-10-CM | POA: Diagnosis not present

## 2019-07-19 DIAGNOSIS — G8220 Paraplegia, unspecified: Secondary | ICD-10-CM

## 2019-07-19 DIAGNOSIS — I10 Essential (primary) hypertension: Secondary | ICD-10-CM

## 2019-07-19 DIAGNOSIS — Z0000 Encounter for general adult medical examination without abnormal findings: Secondary | ICD-10-CM

## 2019-07-19 LAB — URINALYSIS, ROUTINE W REFLEX MICROSCOPIC
Bilirubin Urine: NEGATIVE
Ketones, ur: NEGATIVE
Nitrite: POSITIVE — AB
Specific Gravity, Urine: 1.02 (ref 1.000–1.030)
Urine Glucose: NEGATIVE
Urobilinogen, UA: 0.2 (ref 0.0–1.0)
pH: 5.5 (ref 5.0–8.0)

## 2019-07-19 LAB — LIPID PANEL
Cholesterol: 169 mg/dL (ref 0–200)
HDL: 33.7 mg/dL — ABNORMAL LOW (ref >39.00)
Total CHOL/HDL Ratio: 5
Triglycerides: 510 mg/dL — ABNORMAL HIGH (ref 0.0–149.0)

## 2019-07-19 LAB — CBC
HCT: 36.6 % — ABNORMAL LOW (ref 39.0–52.0)
Hemoglobin: 11.8 g/dL — ABNORMAL LOW (ref 13.0–17.0)
MCHC: 32.4 g/dL (ref 30.0–36.0)
MCV: 64.3 fl — ABNORMAL LOW (ref 78.0–100.0)
Platelets: 300 10*3/uL (ref 150.0–400.0)
RBC: 5.69 Mil/uL (ref 4.22–5.81)
RDW: 17.5 % — ABNORMAL HIGH (ref 11.5–15.5)
WBC: 9.2 10*3/uL (ref 4.0–10.5)

## 2019-07-19 LAB — COMPREHENSIVE METABOLIC PANEL
ALT: 34 U/L (ref 0–53)
AST: 20 U/L (ref 0–37)
Albumin: 4.3 g/dL (ref 3.5–5.2)
Alkaline Phosphatase: 62 U/L (ref 39–117)
BUN: 17 mg/dL (ref 6–23)
CO2: 27 mEq/L (ref 19–32)
Calcium: 9.4 mg/dL (ref 8.4–10.5)
Chloride: 105 mEq/L (ref 96–112)
Creatinine, Ser: 0.75 mg/dL (ref 0.40–1.50)
GFR: 135.71 mL/min (ref >60.00)
Glucose, Bld: 109 mg/dL — ABNORMAL HIGH (ref 70–99)
Potassium: 4.1 mEq/L (ref 3.5–5.1)
Sodium: 142 mEq/L (ref 135–145)
Total Bilirubin: 0.3 mg/dL (ref 0.2–1.2)
Total Protein: 7.3 g/dL (ref 6.0–8.3)

## 2019-07-19 LAB — LDL CHOLESTEROL, DIRECT: Direct LDL: 88 mg/dL

## 2019-07-19 LAB — HEMOGLOBIN A1C: Hgb A1c MFr Bld: 5.8 % (ref 4.6–6.5)

## 2019-07-19 MED ORDER — SULFAMETHOXAZOLE-TRIMETHOPRIM 800-160 MG PO TABS: 1.0000 | ORAL_TABLET | Freq: Two times a day (BID) | ORAL | 0 refills | Status: DC

## 2019-07-19 NOTE — Assessment & Plan Note (Signed)
BP above goal today and he has not taken meds. Checks at home and at goal. Taking lisinopril 20 mg daily which can be increased if needed. Checking CMP and adjust as needed.

## 2019-07-19 NOTE — Assessment & Plan Note (Signed)
Flu shot yearly. Tetanus declines. Counseled about sun safety and mole surveillance. Counseled about the dangers of distracted driving. Given 10 year screening recommendations.

## 2019-07-19 NOTE — Assessment & Plan Note (Signed)
Stable and chronic from prior suicide attempt. Self caths.

## 2019-07-19 NOTE — Patient Instructions (Signed)
We are checking the urine and labs today.   Health Maintenance, Male Adopting a healthy lifestyle and getting preventive care are important in promoting health and wellness. Ask your health care provider about:  The right schedule for you to have regular tests and exams.  Things you can do on your own to prevent diseases and keep yourself healthy. What should I know about diet, weight, and exercise? Eat a healthy diet   Eat a diet that includes plenty of vegetables, fruits, low-fat dairy products, and lean protein.  Do not eat a lot of foods that are high in solid fats, added sugars, or sodium. Maintain a healthy weight Body mass index (BMI) is a measurement that can be used to identify possible weight problems. It estimates body fat based on height and weight. Your health care provider can help determine your BMI and help you achieve or maintain a healthy weight. Get regular exercise Get regular exercise. This is one of the most important things you can do for your health. Most adults should:  Exercise for at least 150 minutes each week. The exercise should increase your heart rate and make you sweat (moderate-intensity exercise).  Do strengthening exercises at least twice a week. This is in addition to the moderate-intensity exercise.  Spend less time sitting. Even light physical activity can be beneficial. Watch cholesterol and blood lipids Have your blood tested for lipids and cholesterol at 46 years of age, then have this test every 5 years. You may need to have your cholesterol levels checked more often if:  Your lipid or cholesterol levels are high.  You are older than 46 years of age.  You are at high risk for heart disease. What should I know about cancer screening? Many types of cancers can be detected early and may often be prevented. Depending on your health history and family history, you may need to have cancer screening at various ages. This may include screening  for:  Colorectal cancer.  Prostate cancer.  Skin cancer.  Lung cancer. What should I know about heart disease, diabetes, and high blood pressure? Blood pressure and heart disease  High blood pressure causes heart disease and increases the risk of stroke. This is more likely to develop in people who have high blood pressure readings, are of African descent, or are overweight.  Talk with your health care provider about your target blood pressure readings.  Have your blood pressure checked: ? Every 3-5 years if you are 99-18 years of age. ? Every year if you are 65 years old or older.  If you are between the ages of 64 and 18 and are a current or former smoker, ask your health care provider if you should have a one-time screening for abdominal aortic aneurysm (AAA). Diabetes Have regular diabetes screenings. This checks your fasting blood sugar level. Have the screening done:  Once every three years after age 66 if you are at a normal weight and have a low risk for diabetes.  More often and at a younger age if you are overweight or have a high risk for diabetes. What should I know about preventing infection? Hepatitis B If you have a higher risk for hepatitis B, you should be screened for this virus. Talk with your health care provider to find out if you are at risk for hepatitis B infection. Hepatitis C Blood testing is recommended for:  Everyone born from [birth year] through 1963.  Anyone with known risk factors for hepatitis C. Sexually  transmitted infections (STIs)  You should be screened each year for STIs, including gonorrhea and chlamydia, if: ? You are sexually active and are younger than 46 years of age. ? You are older than 46 years of age and your health care provider tells you that you are at risk for this type of infection. ? Your sexual activity has changed since you were last screened, and you are at increased risk for chlamydia or gonorrhea. Ask your health care  provider if you are at risk.  Ask your health care provider about whether you are at high risk for HIV. Your health care provider may recommend a prescription medicine to help prevent HIV infection. If you choose to take medicine to prevent HIV, you should first get tested for HIV. You should then be tested every 3 months for as long as you are taking the medicine. Follow these instructions at home: Lifestyle  Do not use any products that contain nicotine or tobacco, such as cigarettes, e-cigarettes, and chewing tobacco. If you need help quitting, ask your health care provider.  Do not use street drugs.  Do not share needles.  Ask your health care provider for help if you need support or information about quitting drugs. Alcohol use  Do not drink alcohol if your health care provider tells you not to drink.  If you drink alcohol: ? Limit how much you have to 0-2 drinks a day. ? Be aware of how much alcohol is in your drink. In the [place] one drink equals one 12 oz bottle of beer (355 mL), one 5 oz glass of wine (148 mL), or one 1 oz glass of hard liquor (44 mL). General instructions  Schedule regular health, dental, and eye exams.  Stay current with your vaccines.  Tell your health care provider if: ? You often feel depressed. ? You have ever been abused or do not feel safe at home. Summary  Adopting a healthy lifestyle and getting preventive care are important in promoting health and wellness.  Follow your health care provider's instructions about healthy diet, exercising, and getting tested or screened for diseases.  Follow your health care provider's instructions on monitoring your cholesterol and blood pressure. This information is not intended to replace advice given to you by your health care provider. Make sure you discuss any questions you have with your health care provider. Document Released: 06/02/2008 Document Revised: 11/28/2018 Document Reviewed: 11/28/2018 Elsevier  Patient Education  2020 [employer].

## 2019-07-19 NOTE — Assessment & Plan Note (Signed)
Taking haldol daily and stable. Coping well with pandemic.

## 2019-07-19 NOTE — Progress Notes (Signed)
   Subjective:   Patient ID: Andre Clements, male    DOB: 1971/07/06, 48 y.o.   MRN: [id number]  HPI  The patient is a 48 YO man coming in for physical. Seen some blood in urine last day and needs check for infection. Does self cath.   PMH, [facility], social history reviewed and updated  Review of Systems  Constitutional: Negative.   HENT: Negative.   Eyes: Negative.   Respiratory: Negative for cough, chest tightness and shortness of breath.   Cardiovascular: Negative for chest pain, palpitations and leg swelling.  Gastrointestinal: Negative for abdominal distention, abdominal pain, constipation, diarrhea, nausea and vomiting.  Genitourinary: Positive for hematuria.  Musculoskeletal: Negative.   Skin: Negative.   Neurological: Negative.   Psychiatric/Behavioral: Negative.     Objective:  Physical Exam Constitutional:      Appearance: He is well-developed.  HENT:     Head: Normocephalic and atraumatic.  Neck:     Musculoskeletal: Normal range of motion.  Cardiovascular:     Rate and Rhythm: Normal rate and regular rhythm.  Pulmonary:     Effort: Pulmonary effort is normal. No respiratory distress.     Breath sounds: Normal breath sounds. No wheezing or rales.  Abdominal:     General: Bowel sounds are normal. There is no distension.     Palpations: Abdomen is soft.     Tenderness: There is no abdominal tenderness. There is no rebound.  Skin:    General: Skin is warm and dry.  Neurological:     Mental Status: He is alert and oriented to person, place, and time.     Coordination: Coordination abnormal.     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

## 2019-07-19 NOTE — Assessment & Plan Note (Signed)
Checking HgA1c and adjust as needed.  

## 2019-07-19 NOTE — Assessment & Plan Note (Signed)
Checking lipid panel and adjust as needed.  

## 2019-07-21 LAB — URINE CULTURE
MICRO NUMBER:: 724911
SPECIMEN QUALITY:: ADEQUATE

## 2019-07-22 ENCOUNTER — Other Ambulatory Visit: Payer: Self-pay

## 2019-07-22 MED ORDER — SULFAMETHOXAZOLE-TRIMETHOPRIM 800-160 MG PO TABS: 1.0000 | ORAL_TABLET | Freq: Two times a day (BID) | ORAL | 0 refills | Status: DC

## 2019-08-19 ENCOUNTER — Other Ambulatory Visit: Payer: Self-pay | Admitting: Internal Medicine

## 2019-08-19 DIAGNOSIS — F251 Schizoaffective disorder, depressive type: Secondary | ICD-10-CM

## 2019-08-27 ENCOUNTER — Other Ambulatory Visit: Payer: Self-pay | Admitting: Internal Medicine

## 2019-08-27 DIAGNOSIS — F251 Schizoaffective disorder, depressive type: Secondary | ICD-10-CM

## 2019-08-27 DIAGNOSIS — E782 Mixed hyperlipidemia: Secondary | ICD-10-CM

## 2019-08-27 DIAGNOSIS — E119 Type 2 diabetes mellitus without complications: Secondary | ICD-10-CM

## 2019-10-03 ENCOUNTER — Ambulatory Visit (INDEPENDENT_AMBULATORY_CARE_PROVIDER_SITE_OTHER): Payer: Medicare Other | Admitting: *Deleted

## 2019-10-03 DIAGNOSIS — Z0000 Encounter for general adult medical examination without abnormal findings: Secondary | ICD-10-CM

## 2019-10-03 NOTE — Progress Notes (Signed)
Medical screening examination/treatment/procedure(s) were performed by non-physician practitioner and as supervising physician I was immediately available for consultation/collaboration. I agree with above. Dhillon Comunale A Khrystyna Schwalm, MD 

## 2019-10-03 NOTE — Progress Notes (Signed)
Subjective:   Andre Clements is a 48 y.o. male who presents for Medicare Annual/Subsequent preventive examination.  I connected with patient by a telephone and verified that I am speaking with the correct person using two identifiers. Patient stated full name and DOB. Patient gave permission to continue with telephonic visit. Patient's location was at home and Nurse's location was at [place] office. Participants during this visit included patient and nurse. No referrals ordered during this visit  Review of Systems:   Cardiac Risk Factors include: diabetes mellitus;dyslipidemia;hypertension;male gender;obesity (BMI >30kg/m2) Sleep patterns: feels rested on waking,  sleeps 7-8 hours nightly.    Home Safety/Smoke Alarms: Feels safe in home. Smoke alarms in place.  Living environment; residence and Adult nurse: apartment, equipment: Hydrologist, Type: Tub Surveyor, quantity, wheelchair. Lives alone, no needs for DME, good support system, has an aide to assist with ADLs twice weekly. Seat Belt Safety/Bike Helmet: Wears seat belt.       Objective:    Vitals: There were no vitals taken for this visit.  There is no height or weight on file to calculate BMI.  Advanced Directives 10/03/2019 10/01/2018 09/10/2018 08/25/2018  Does Patient Have a Medical Advance Directive? No No No No  Does patient want to make changes to medical advance directive? - Yes (ED - Information included in AVS) - -  Would patient like information on creating a medical advance directive? No - Patient declined - - Yes (ED - Information included in AVS)    Tobacco Social History   Tobacco Use  Smoking Status Never Smoker  Smokeless Tobacco Never Used     Counseling given: Not Answered  Past Medical History:  Diagnosis Date  . Bipolar 2 disorder ([place])   . Blood in stool   . Depression   . History of spinal cord injury     form accident,paralysis of BLE  . Hyperlipidemia   . Hypertension   . Urine incontinence     Past Surgical History:  Procedure Laterality Date  . ABDOMINAL HERNIA REPAIR  2003  . arm surgery Right    from suicide attempt  . CYSTOSCOPY WITH LITHOLAPAXY N/A 08/26/2018   Procedure: CYSTOSCOPY WITH LITHOLAPAXY;  Surgeon: Ceasar Mons, MD;  Location: [facility];  Service: Urology;  Laterality: N/A;   Family History  Problem Relation Age of Onset  . Alcohol abuse Mother   . Hypertension Mother   . Alcohol abuse Father   . Other Neg Hx        hypogonadism   Social History   Socioeconomic History  . Marital status: Single    Spouse name: Not on file  . Number of children: Not on file  . Years of education: Not on file  . Highest education level: Not on file  Occupational History  . Occupation: disability  Social Needs  . Financial resource strain: Not very hard  . Food insecurity    Worry: Never true    Inability: Never true  . Transportation needs    Medical: No    Non-medical: No  Tobacco Use  . Smoking status: Never Smoker  . Smokeless tobacco: Never Used  Substance and Sexual Activity  . Alcohol use: No    Alcohol/week: 0.0 standard drinks  . Drug use: No  . Sexual activity: Not Currently  Lifestyle  . Physical activity    Days per week: 5 days    Minutes per session: 30 min  . Stress: Not at all  Relationships  .  Social connections    Talks on phone: More than three times a week    Gets together: Twice a week    Attends religious service: Not on file    Active member of club or organization: Not on file    Attends meetings of clubs or organizations: Not on file    Relationship status: Not on file  Other Topics Concern  . Not on file  Social History Narrative  . Not on file    Outpatient Encounter Medications as of 10/03/2019  Medication Sig  . Ascorbic Acid (VITAMIN C) 250 MG CHEW Chew 500 each by mouth daily.   . Cholecalciferol (VITAMIN D3) 5000 units CAPS Take 5,000 Units by mouth daily.   . ferrous sulfate (IRON SUPPLEMENT) 325 (65  FE) MG tablet Take 325 mg by mouth daily.   . haloperidol (HALDOL) 5 MG tablet TAKE 1 TABLET BY MOUTH  DAILY  . lisinopril (ZESTRIL) 20 MG tablet TAKE 1 TABLET BY MOUTH  DAILY  . metFORMIN (GLUCOPHAGE-XR) 500 MG 24 hr tablet TAKE 1 TABLET BY MOUTH  DAILY WITH BREAKFAST  . Multiple Vitamins-Minerals (CENTRUM VITAMINTS PO) Take 1 tablet by mouth daily.  Marland Kitchen omeprazole (PRILOSEC) 40 MG capsule TAKE 1 CAPSULE BY MOUTH  DAILY  . sertraline (ZOLOFT) 100 MG tablet TAKE 2 TABLETS BY MOUTH  DAILY  . simvastatin (ZOCOR) 40 MG tablet TAKE 1 TABLET BY MOUTH  DAILY AT 6 PM  . [DISCONTINUED] finasteride (PROSCAR) 5 MG tablet Take 1 tablet (5 mg total) by mouth daily. (Patient not taking: Reported on 10/03/2019)  . [DISCONTINUED] sulfamethoxazole-trimethoprim (BACTRIM DS) 800-160 MG tablet Take 1 tablet by mouth 2 (two) times daily. (Patient not taking: Reported on 10/03/2019)   No facility-administered encounter medications on file as of 10/03/2019.     Activities of Daily Living In your present state of health, do you have any difficulty performing the following activities: 10/03/2019  Hearing? N  Vision? N  Difficulty concentrating or making decisions? N  Walking or climbing stairs? Y  Comment BLE paralysis wheelchair bond  Dressing or bathing? Y  Doing errands, shopping? Y  Preparing Food and eating ? N  Using the Toilet? N  In the past six months, have you accidently leaked urine? N  Comment chronic foley catherter  Do you have problems with loss of bowel control? N  Managing your Medications? N  Managing your Finances? N  Housekeeping or managing your Housekeeping? Y  Some recent data might be hidden    Patient Care Team: Hoyt Koch, MD as PCP - General (Internal Medicine)   Assessment:   This is a routine wellness examination for Andre Clements. Physical assessment deferred to PCP.  Exercise Activities and Dietary recommendations Current Exercise Habits: Home exercise routine, Type of  exercise: strength training/weights(upper body weights), Time (Minutes): 30, Frequency (Times/Week): 5, Weekly Exercise (Minutes/Week): 150, Exercise limited by: neurologic condition(s)(BLE paralysis)  Diet (meal preparation, eat out, water intake, caffeinated beverages, dairy products, fruits and vegetables): in general, a "healthy" diet     Reviewed heart healthy and diabetic diet. Encouraged patient to increase daily water and healthy fluid intake.    Goals    . Patient Stated     Maintain current health status.        Fall Risk Fall Risk  10/03/2019 10/01/2018  Falls in the past year? Exclusion - non ambulatory No  Comment BLE paraplegia wheelchair bond -   Depression Screen PHQ 2/9 Scores 10/03/2019 10/01/2018  PHQ -  2 Score 0 0  PHQ- 9 Score 0 0    Cognitive Function       Ad8 score reviewed for issues:  Issues making decisions: no  Less interest in hobbies / activities: no  Repeats questions, stories (family complaining): no  Trouble using ordinary gadgets (microwave, computer, phone):no  Forgets the month or year: no  Mismanaging finances: no  Remembering appts: no  Daily problems with thinking and/or memory: no Ad8 score is= 0  Immunization History  Administered Date(s) Administered  . Influenza,inj,Quad PF,6+ Mos 09/16/2015, 09/27/2016, 09/26/2017, 09/03/2018  . Pneumococcal Polysaccharide-23 12/15/2015   Screening Tests Health Maintenance  Topic Date Due  . TETANUS/TDAP  08/14/1992  . INFLUENZA VACCINE  03/19/2020 (Originally 07/20/2019)  . HIV Screening  Completed       Plan:   Reviewed health maintenance screenings with patient today and relevant education, vaccines, and/or referrals were provided.   I have personally reviewed and noted the following in the patient's chart:   . Medical and social history . Use of alcohol, tobacco or illicit drugs  . Current medications and supplements . Functional ability and status . Nutritional  status . Physical activity . Advanced directives . List of other physicians . Screenings to include cognitive, depression, and falls . Referrals and appointments  In addition, I have reviewed and discussed with patient certain preventive protocols, quality metrics, and best practice recommendations. A written personalized care plan for preventive services as well as general preventive health recommendations were provided to patient.     Michiel Cowboy, RN  10/03/2019

## 2019-10-04 IMAGING — CR DG ABDOMEN ACUTE W/ 1V CHEST
4 series · 4 of 4 positions shown · non-contrast
Comparison: Chest 10/08/2010

CLINICAL DATA: Abdominal pain. Constipation. Patient took a
laxative and had diarrhea. History of hypertension. Nonsmoker.

EXAM:
DG ABDOMEN ACUTE W/ 1V CHEST

[w abdomen decub (1 of 2)]
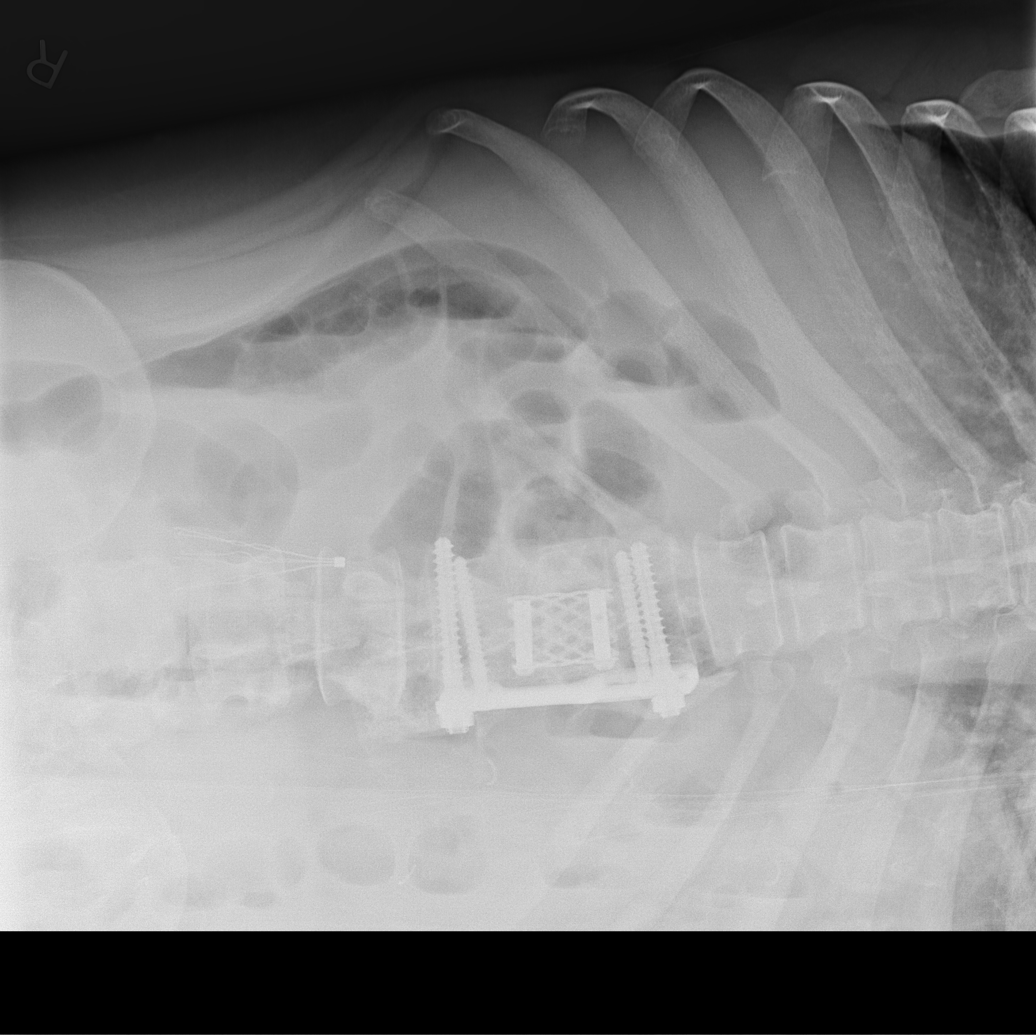

[w abdomen decub (2 of 2)]
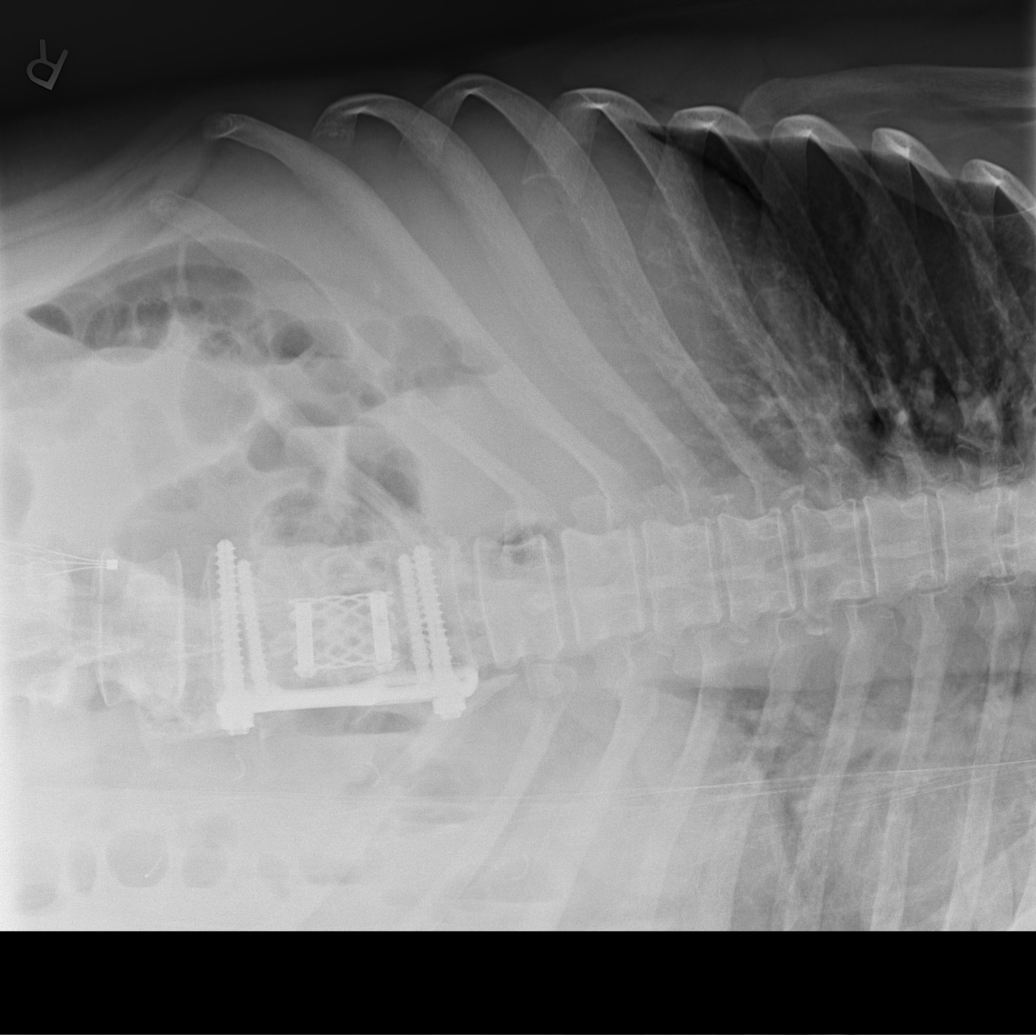

[x chest ap]
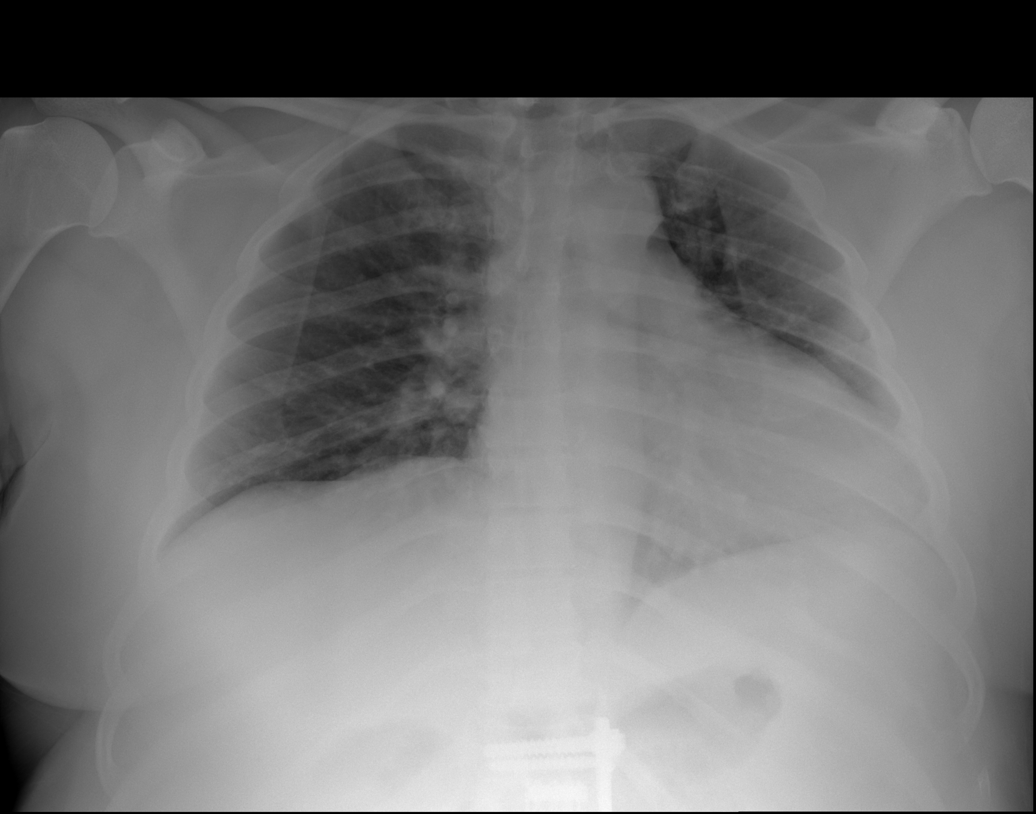

[x abdomen supine]
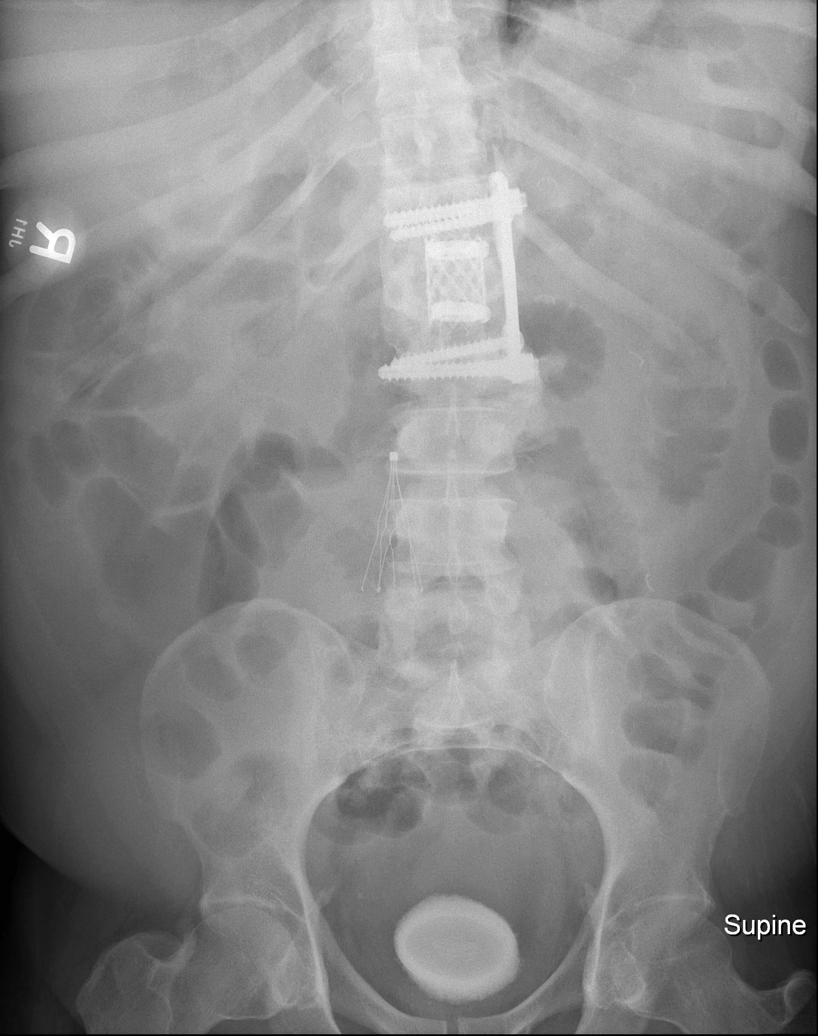

[4 of 4 positions shown; findings below may reference images not displayed]

FINDINGS: Shallow inspiration. Mild cardiac enlargement. Lungs are clear and
expanded. No blunting of costophrenic angles. No pneumothorax.

Scattered gas and stool in the colon. Scattered gas-filled small
bowel. No small or large bowel dilatation. Some of the small bowel
loops suggest wall thickening which could indicate enteritis. No
free intra-abdominal air. No abnormal air-fluid levels. No
radiopaque stones. Inferior vena caval filter. Postoperative changes
at the thoracolumbar junction. Density in the pelvis could represent
a large calcified bladder stone or residual contrast material in the
bladder.
IMPRESSION: Mild cardiac enlargement. No evidence of active pulmonary disease.
Nonobstructive bowel gas pattern with scattered gas in the colon and
small bowel possibly indicating ileus or enteritis.

## 2019-11-05 ENCOUNTER — Encounter: Payer: Self-pay | Admitting: *Deleted

## 2020-01-02 ENCOUNTER — Ambulatory Visit: Payer: Self-pay | Admitting: Internal Medicine

## 2020-01-02 NOTE — Telephone Encounter (Signed)
Pt called with left flank pain.States for 2 days, he thinks it is a kidney stone, Pt states he can only get transportation tomorrow, advised other modes of transportation. Transferred to Dr Sharlet Salina office.  Reason for Disposition . MODERATE pain (e.g., interferes with normal activities or awakens from sleep) . Blood in urine (red, pink, or tea-colored)  Answer Assessment - Initial Assessment Questions 1. LOCATION: "Where does it hurt?" (e.g., left, right)     Left flank 2. ONSET: "When did the pain start?"     2 days ago 3. SEVERITY: "How bad is the pain?" (e.g., Scale 1-10; mild, moderate, or severe)   - MILD (1-3): doesn't interfere with normal activities    - MODERATE (4-7): interferes with normal activities or awakens from sleep    - SEVERE (8-10): excruciating pain and patient unable to do normal activities (stays in bed)        8/10 4. PATTERN: "Does the pain come and go, or is it constant?"      Comes and goes lasts 15 min 5. CAUSE: "What do you think is causing the pain?"     Thinks it is kidney, had bladder stone before 6. OTHER SYMPTOMS:  "Do you have any other symptoms?" (e.g., fever, abdominal pain, vomiting, leg weakness, burning with urination, blood in urine)     Blood in urine, not a lot 7. PREGNANCY:  "Is there any chance you are pregnant?" "When was your last menstrual period?"     na  Protocols used: FLANK PAIN-A-AH

## 2020-01-03 ENCOUNTER — Ambulatory Visit (INDEPENDENT_AMBULATORY_CARE_PROVIDER_SITE_OTHER): Payer: Medicare Other | Admitting: Family

## 2020-01-03 ENCOUNTER — Encounter: Payer: Self-pay | Admitting: Family

## 2020-01-03 ENCOUNTER — Other Ambulatory Visit: Payer: Self-pay

## 2020-01-03 VITALS — BP 140/80 | HR 70 | Temp 98.0°F | Ht 68.0 in | Wt 237.0 lb

## 2020-01-03 DIAGNOSIS — R319 Hematuria, unspecified: Secondary | ICD-10-CM | POA: Diagnosis not present

## 2020-01-03 DIAGNOSIS — R7303 Prediabetes: Secondary | ICD-10-CM | POA: Diagnosis not present

## 2020-01-03 LAB — POC URINALSYSI DIPSTICK (AUTOMATED)
Bilirubin, UA: NEGATIVE
Blood, UA: POSITIVE
Glucose, UA: NEGATIVE
Ketones, UA: NEGATIVE
Nitrite, UA: NEGATIVE
Protein, UA: POSITIVE — AB
Spec Grav, UA: 1.02 (ref 1.010–1.025)
Urobilinogen, UA: 0.2 E.U./dL
pH, UA: 5.5 (ref 5.0–8.0)

## 2020-01-03 MED ORDER — SULFAMETHOXAZOLE-TRIMETHOPRIM 800-160 MG PO TABS: 1.0000 | ORAL_TABLET | Freq: Two times a day (BID) | ORAL | 0 refills | Status: DC

## 2020-01-03 NOTE — Addendum Note (Signed)
Addended by: Marcina Millard on: 01/03/2020 01:38 PM   Modules accepted: Orders

## 2020-01-03 NOTE — Progress Notes (Signed)
Andre Clements is a 49 y.o. male with the following history as recorded in EpicCare:  Patient Active Problem List   Diagnosis Date Noted  . Right knee pain 01/29/2019  . Hair loss 01/29/2019  . Urinary bladder stone 08/25/2018  . Impaired fasting blood sugar 09/26/2017  . Routine general medical examination at a health care facility 06/24/2016  . Ventral hernia 05/26/2016  . Pituitary insufficiency ([place]) 03/04/2016  . Hypogonadism male 03/01/2016  . Essential hypertension 09/16/2015  . Hyperlipidemia 09/16/2015  . 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 Mood disorder ([place]) 09/16/2015    Current Outpatient Medications  Medication Sig Dispense Refill  . Ascorbic Acid (VITAMIN C) 250 MG CHEW Chew 500 each by mouth daily.     . Cholecalciferol (VITAMIN D3) 5000 units CAPS Take 5,000 Units by mouth daily.     . ferrous sulfate (IRON SUPPLEMENT) 325 (65 FE) MG tablet Take 325 mg by mouth daily.     . finasteride (PROSCAR) 5 MG tablet Take 5 mg by mouth daily.    . haloperidol (HALDOL) 5 MG tablet TAKE 1 TABLET BY MOUTH  DAILY 90 tablet 3  . lisinopril (ZESTRIL) 20 MG tablet TAKE 1 TABLET BY MOUTH  DAILY 90 tablet 1  . metFORMIN (GLUCOPHAGE-XR) 500 MG 24 hr tablet TAKE 1 TABLET BY MOUTH  DAILY WITH BREAKFAST 90 tablet 0  . Multiple Vitamins-Minerals (CENTRUM VITAMINTS PO) Take 1 tablet by mouth daily.    Marland Kitchen omeprazole (PRILOSEC) 40 MG capsule TAKE 1 CAPSULE BY MOUTH  DAILY 90 capsule 0  . sertraline (ZOLOFT) 100 MG tablet TAKE 2 TABLETS BY MOUTH  DAILY 180 tablet 0  . simvastatin (ZOCOR) 40 MG tablet TAKE 1 TABLET BY MOUTH  DAILY AT 6 PM 90 tablet 0  . sulfamethoxazole-trimethoprim (BACTRIM DS) 800-160 MG tablet Take 1 tablet by mouth 2 (two) times daily. 10 tablet 0   No current facility-administered medications for this visit.    Allergies: Patient has no known allergies.  Past Medical History:  Diagnosis Date  . Bipolar 2 disorder ([place])   . Blood in stool   . Depression    . History of spinal cord injury     form accident,paralysis of BLE  . Hyperlipidemia   . Hypertension   . Urine incontinence     Past Surgical History:  Procedure Laterality Date  . ABDOMINAL HERNIA REPAIR  2003  . arm surgery Right    from suicide attempt  . CYSTOSCOPY WITH LITHOLAPAXY N/A 08/26/2018   Procedure: CYSTOSCOPY WITH LITHOLAPAXY;  Surgeon: Ceasar Mons, MD;  Location: [facility];  Service: Urology;  Laterality: N/A;    Family History  Problem Relation Age of Onset  . Alcohol abuse Mother   . Hypertension Mother   . Alcohol abuse Father   . Other Neg Hx        hypogonadism    Social History   Tobacco Use  . Smoking status: Never Smoker  . Smokeless tobacco: Never Used  Substance Use Topics  . Alcohol use: No    Alcohol/week: 0.0 standard drinks    Subjective:  Patient presents with concerns for blood in his urine for the past 2 weeks; notes had been having left sided flank pain up until last night; history of bladder stone that required hospitalization in 2019; per patient, he did not complete the urology follow up that was requested; he does have to cath and admits he has been using old supplies; unclear  as to who is managing the catheter supplies for him?     Objective:  Vitals:   01/03/20 1008  BP: 140/80  Pulse: 70  Temp: 98 F (36.7 C)  TempSrc: Oral  SpO2: 98%  Weight: 237 lb (107.5 kg)  Height: 5\' 8"  (1.727 m)    General: Well developed, well nourished, in no acute distress  Skin : Warm and dry.  Head: Normocephalic and atraumatic  Neck: Supple without thyromegaly, adenopathy  Lungs: Respirations unlabored;  Neurologic: Alert and oriented; speech intact; face symmetrical; in wheelchair  Assessment:  1. Hematuria, unspecified type   2. Pre-diabetes     Plan:  1. Check U/A and urine culture; RX for Bactrim DS bid x 5 days; referral updated back to his urologist; update renal stone CT- patient asks for test to be scheduled on Monday  due to transportation issues. 2. Refer to dietician as requested;   He is overdue for 6 month follow-up with his PCP for chronic care needs; encouraged to schedule for follow-up.   This visit occurred during the SARS-CoV-2 public health emergency.  Safety protocols were in place, including screening questions prior to the visit, additional usage of staff PPE, and extensive cleaning of exam room while observing appropriate contact time as indicated for disinfecting solutions.    Return in about 3 weeks (around 01/24/2020) for with Dr. Sharlet Salina 6 month follow-up/ medication refills.  Orders Placed This Encounter  Procedures  . Urine Culture  . CT RENAL STONE STUDY    Cecille Rubin?[facility] [employer]. Will get pre cert    Standing Status:   Future    Standing Expiration Date:   04/02/2021    Order Specific Question:   Preferred imaging location?    Answer:   [place]    Order Specific Question:   Radiology Contrast Protocol - do NOT remove file path    Answer:   [email or web address]  . Ambulatory referral to Urology    Referral Priority:   Routine    Referral Type:   Consultation    Referral Reason:   Specialty Services Required    Referred to Provider:   Ceasar Mons, MD    Requested Specialty:   Urology    Number of Visits Requested:   1    Requested Prescriptions   Signed Prescriptions Disp Refills  . sulfamethoxazole-trimethoprim (BACTRIM DS) 800-160 MG tablet 10 tablet 0    Sig: Take 1 tablet by mouth 2 (two) times daily.

## 2020-01-03 NOTE — Addendum Note (Signed)
Addended by: Marcina Millard on: 01/03/2020 01:12 PM   Modules accepted: Orders

## 2020-01-03 NOTE — Addendum Note (Signed)
Addended by: Trenda Moots on: [id number] 01:59 PM   Modules accepted: Orders

## 2020-01-04 LAB — URINE CULTURE

## 2020-01-06 ENCOUNTER — Ambulatory Visit (INDEPENDENT_AMBULATORY_CARE_PROVIDER_SITE_OTHER)
Admission: RE | Admit: 2020-01-06 | Discharge: 2020-01-06 | Disposition: A | Discharge status: Home / Self Care | Payer: Medicare Other | Source: Ambulatory Visit | Attending: Family | Admitting: Family

## 2020-01-06 ENCOUNTER — Other Ambulatory Visit: Payer: Self-pay

## 2020-01-06 DIAGNOSIS — R319 Hematuria, unspecified: Secondary | ICD-10-CM

## 2020-01-06 DIAGNOSIS — N200 Calculus of kidney: Secondary | ICD-10-CM | POA: Diagnosis not present

## 2020-01-24 ENCOUNTER — Ambulatory Visit: Payer: Medicare Other | Admitting: Internal Medicine

## 2020-02-14 ENCOUNTER — Telehealth: Payer: Self-pay | Admitting: Internal Medicine

## 2020-02-14 DIAGNOSIS — E119 Type 2 diabetes mellitus without complications: Secondary | ICD-10-CM

## 2020-02-14 DIAGNOSIS — F251 Schizoaffective disorder, depressive type: Secondary | ICD-10-CM

## 2020-02-14 DIAGNOSIS — I10 Essential (primary) hypertension: Secondary | ICD-10-CM

## 2020-02-14 DIAGNOSIS — E782 Mixed hyperlipidemia: Secondary | ICD-10-CM

## 2020-02-14 MED ORDER — HALOPERIDOL 5 MG PO TABS: 5.0000 mg | ORAL_TABLET | Freq: Every day | ORAL | 1 refills | Status: DC

## 2020-02-14 MED ORDER — SERTRALINE HCL 100 MG PO TABS: 200.0000 mg | ORAL_TABLET | Freq: Every day | ORAL | 1 refills | Status: DC

## 2020-02-14 MED ORDER — SIMVASTATIN 40 MG PO TABS: ORAL_TABLET | ORAL | 1 refills | Status: DC

## 2020-02-14 MED ORDER — LISINOPRIL 20 MG PO TABS: 20.0000 mg | ORAL_TABLET | Freq: Every day | ORAL | 1 refills | Status: DC

## 2020-02-14 MED ORDER — FINASTERIDE 5 MG PO TABS: 5.0000 mg | ORAL_TABLET | Freq: Every day | ORAL | 1 refills | Status: DC

## 2020-02-14 MED ORDER — METFORMIN HCL ER 500 MG PO TB24: 500.0000 mg | ORAL_TABLET | Freq: Every day | ORAL | 1 refills | Status: DC

## 2020-02-14 NOTE — Telephone Encounter (Signed)
New message:  Medication Requested:  simvastatin (ZOCOR) 40 MG tablet lisinopril (ZESTRIL) 20 MG tablet metFORMIN (GLUCOPHAGE-XR) 500 MG 24 hr tablet finasteride (PROSCAR) 5 MG tablet haloperidol (HALDOL) 5 MG tablet omeprazole (PRILOSEC) 40 MG capsule sertraline (ZOLOFT) 100 MG tablet Is medication on med list: Yes (if no, inform pt they may need an appointment)  Is medication a controled: (yes = last OV with PCP)  -Controlled Substances: Adderall, Ritalin, oxycodone, hydrocodone, methadone, alprazolam, etc  Last visit with PCP: 01/03/20  Is the OV > than 4 months: NO (yes = schedule an appt if one is not already made)  Pharmacy (Name, [place]):   [facility] ([place]), [place] - [address]

## 2020-02-20 ENCOUNTER — Telehealth: Payer: Self-pay

## 2020-02-20 ENCOUNTER — Other Ambulatory Visit: Payer: Self-pay | Admitting: Internal Medicine

## 2020-02-20 DIAGNOSIS — F251 Schizoaffective disorder, depressive type: Secondary | ICD-10-CM

## 2020-02-20 DIAGNOSIS — E782 Mixed hyperlipidemia: Secondary | ICD-10-CM

## 2020-02-20 DIAGNOSIS — E119 Type 2 diabetes mellitus without complications: Secondary | ICD-10-CM

## 2020-02-20 DIAGNOSIS — I10 Essential (primary) hypertension: Secondary | ICD-10-CM

## 2020-02-20 NOTE — Telephone Encounter (Signed)
1.Medication Requested:  Ascorbic Acid (VITAMIN C) 250 MG CHEW Cholecalciferol (VITAMIN D3) 5000 units CAPS ferrous sulfate (IRON SUPPLEMENT) 325 (65 FE) MG tablet finasteride (PROSCAR) 5 MG tablet haloperidol (HALDOL) 5 MG tablet lisinopril (ZESTRIL) 20 MG tablet metFORMIN (GLUCOPHAGE-XR) 500 MG 24 hr tablet Multiple Vitamins-Minerals (CENTRUM VITAMINTS PO) omeprazole (PRILOSEC) 40 MG capsule sertraline (ZOLOFT) 100 MG tablet simvastatin (ZOCOR) 40 MG tablet sulfamethoxazole-trimethoprim (BACTRIM DS) 800-160 MG tablet   2. Pharmacy (Name, Street, [place]): [facility]    3. On Med List:  Yes   4. Last Visit with PCP:  7.31.20   5. Next visit date with PCP: pending   6. Patient is asking for all medication sent to [facility]    Agent: Please be advised that RX refills may take up to 3 business days. We ask that you follow-up with your pharmacy.

## 2020-02-21 MED ORDER — HALOPERIDOL 5 MG PO TABS: 5.0000 mg | ORAL_TABLET | Freq: Every day | ORAL | 1 refills | Status: DC

## 2020-02-21 MED ORDER — FINASTERIDE 5 MG PO TABS: 5.0000 mg | ORAL_TABLET | Freq: Every day | ORAL | 1 refills | Status: DC

## 2020-02-21 MED ORDER — SIMVASTATIN 40 MG PO TABS: ORAL_TABLET | ORAL | 1 refills | Status: DC

## 2020-02-21 MED ORDER — LISINOPRIL 20 MG PO TABS: 20.0000 mg | ORAL_TABLET | Freq: Every day | ORAL | 1 refills | Status: DC

## 2020-02-21 MED ORDER — OMEPRAZOLE 40 MG PO CPDR: 40.0000 mg | DELAYED_RELEASE_CAPSULE | Freq: Every day | ORAL | 1 refills | Status: DC

## 2020-02-21 MED ORDER — METFORMIN HCL ER 500 MG PO TB24: 500.0000 mg | ORAL_TABLET | Freq: Every day | ORAL | 1 refills | Status: DC

## 2020-03-03 ENCOUNTER — Telehealth: Payer: Self-pay

## 2020-03-03 NOTE — Telephone Encounter (Deleted)
Error

## 2020-03-04 ENCOUNTER — Encounter: Payer: Self-pay | Admitting: Internal Medicine

## 2020-03-04 ENCOUNTER — Other Ambulatory Visit: Payer: Self-pay

## 2020-03-04 ENCOUNTER — Ambulatory Visit (INDEPENDENT_AMBULATORY_CARE_PROVIDER_SITE_OTHER): Payer: Medicare Other | Admitting: Internal Medicine

## 2020-03-04 VITALS — BP 142/96 | HR 69 | Temp 98.5°F | Ht 68.0 in | Wt 275.4 lb

## 2020-03-04 DIAGNOSIS — G8220 Paraplegia, unspecified: Secondary | ICD-10-CM

## 2020-03-04 DIAGNOSIS — Z23 Encounter for immunization: Secondary | ICD-10-CM | POA: Diagnosis not present

## 2020-03-04 NOTE — Progress Notes (Signed)
   Subjective:   Patient ID: Andre Clements, male    DOB: August 18, 1971, 49 y.o.   MRN: [id number]  HPI The patient is a 49 YO man coming in for inability to use public transit system. He is paralyzed and gets around well with his manual wheelchair. He cannot be accommodated on the public bus route as changing buses is too much for him to do. He denies recent change in health. Denies recurrence of urinary infection or stones. Denies chest pains or SOB.   Review of Systems  Constitutional: Negative.   HENT: Negative.   Eyes: Negative.   Respiratory: Negative for cough, chest tightness and shortness of breath.   Cardiovascular: Negative for chest pain, palpitations and leg swelling.  Gastrointestinal: Negative for abdominal distention, abdominal pain, constipation, diarrhea, nausea and vomiting.  Musculoskeletal: Positive for gait problem.  Skin: Negative.   Neurological: Positive for weakness and numbness.  Psychiatric/Behavioral: Negative.     Objective:  Physical Exam Constitutional:      Appearance: He is well-developed.  HENT:     Head: Normocephalic and atraumatic.  Cardiovascular:     Rate and Rhythm: Normal rate and regular rhythm.  Pulmonary:     Effort: Pulmonary effort is normal. No respiratory distress.     Breath sounds: Normal breath sounds. No wheezing or rales.  Abdominal:     General: Bowel sounds are normal. There is no distension.     Palpations: Abdomen is soft.     Tenderness: There is no abdominal tenderness. There is no rebound.  Musculoskeletal:     Cervical back: Normal range of motion.  Skin:    General: Skin is warm and dry.  Neurological:     Mental Status: He is alert and oriented to person, place, and time. Mental status is at baseline.     Cranial Nerves: Cranial nerve deficit present.     Coordination: Coordination abnormal.     Comments: In manual wheelchair     Vitals:   03/04/20 1139  BP: (!) 142/96  Pulse: 69  Temp: 98.5 F (36.9 C)   TempSrc: Oral  SpO2: 96%  Weight: 275 lb 6.4 oz (124.9 kg)  Height: 5\' 8"  (1.727 m)   This visit occurred during the SARS-CoV-2 public health emergency.  Safety protocols were in place, including screening questions prior to the visit, additional usage of staff PPE, and extensive cleaning of exam room while observing appropriate contact time as indicated for disinfecting solutions.   Assessment & Plan:  Flu shot given at visit

## 2020-03-04 NOTE — Patient Instructions (Signed)
COVID-19 Vaccine Information can be found at: [email or web address] For questions related to vaccine distribution or appointments, please email [email or web address] or call [phone].    

## 2020-03-05 ENCOUNTER — Telehealth: Payer: Self-pay | Admitting: Internal Medicine

## 2020-03-05 NOTE — Assessment & Plan Note (Signed)
[employer] forms filled out and he is appropriate for that method of transportation. Manual wheelchair in good condition and he is able to maneuver appropriately.

## 2020-03-05 NOTE — Telephone Encounter (Signed)
New message:   Pt states he is needing a new prescription sent for his medical supplies to [facility] for overnight bags, leg bags, catheter, and drainage bags.

## 2020-03-16 NOTE — Telephone Encounter (Signed)
They have to fax [place] paperwork. We do not have capacity to send rx to them directly.

## 2020-03-16 NOTE — Telephone Encounter (Signed)
Pt advised.

## 2020-05-11 ENCOUNTER — Telehealth: Payer: Self-pay | Admitting: Internal Medicine

## 2020-05-11 NOTE — Telephone Encounter (Signed)
New message:   Pt states [facility] is needing a new prescription sent for his sertraline (ZOLOFT) 100 MG tablet. Pt states he is completely out of this medication. Pt states he needs a emergency override of this prescription sent to [facility] ([place] [place]. He needs a short supply sent to the [facility] until [facility] can get it to him. Please advise.

## 2020-05-12 NOTE — Telephone Encounter (Signed)
New message:   Pt is calling to check on the status of his emergency override for his Zoloft. Please advise.

## 2020-05-13 ENCOUNTER — Other Ambulatory Visit: Payer: Self-pay

## 2020-05-13 DIAGNOSIS — F251 Schizoaffective disorder, depressive type: Secondary | ICD-10-CM

## 2020-05-13 MED ORDER — SERTRALINE HCL 100 MG PO TABS: 200.0000 mg | ORAL_TABLET | Freq: Every day | ORAL | 1 refills | Status: DC

## 2020-05-13 NOTE — Telephone Encounter (Signed)
A refill was already on file at [facility].  Spoke to [place] at [facility] prescription to be filled today.   Patient is aware.  Refills sent to [facility]

## 2020-06-15 ENCOUNTER — Telehealth: Payer: Self-pay

## 2020-06-15 NOTE — Telephone Encounter (Signed)
New message    [facility] is waiting on sign signature from Dr. Sharlet Salina.   The Incontinence forms were sent in March.    Dr. Sharlet Salina fax # was given  [phone]

## 2020-06-15 NOTE — Telephone Encounter (Signed)
Will keep an eye out for forms to be faxed. I do not see anything that has been scanned in the media section for March.

## 2020-06-25 ENCOUNTER — Other Ambulatory Visit: Payer: Self-pay | Admitting: Internal Medicine

## 2020-07-01 ENCOUNTER — Other Ambulatory Visit: Payer: Self-pay | Admitting: Internal Medicine

## 2020-07-01 DIAGNOSIS — I10 Essential (primary) hypertension: Secondary | ICD-10-CM

## 2020-08-28 ENCOUNTER — Telehealth: Payer: Self-pay | Admitting: Internal Medicine

## 2020-08-28 NOTE — Progress Notes (Signed)
°  Chronic Care Management   Note  08/28/2020 Name: Andre Clements MRN: [id number] DOB: 04-06-1971  Andre Clements is a 49 y.o. year old male who is a primary care patient of Hoyt Koch, MD. I reached out to Andre Clements by phone today in response to a referral sent by Andre Clements's PCP, Hoyt Koch, MD.   Andre Clements was given information about Chronic Care Management services today including:  1. CCM service includes personalized support from designated clinical staff supervised by his physician, including individualized plan of care and coordination with other care providers 2. 24/7 contact phone numbers for assistance for urgent and routine care needs. 3. Service will only be billed when office clinical staff spend 20 minutes or more in a month to coordinate care. 4. Only one practitioner may furnish and bill the service in a calendar month. 5. The patient may stop CCM services at any time (effective at the end of the month) by phone call to the office staff.   Patient agreed to services and verbal consent obtained.   Follow up plan:   Andre Clements [employer] Scheduler

## 2020-09-04 ENCOUNTER — Ambulatory Visit: Payer: Medicare Other | Admitting: Internal Medicine

## 2020-09-06 ENCOUNTER — Other Ambulatory Visit: Payer: Self-pay | Admitting: Internal Medicine

## 2020-09-06 DIAGNOSIS — I10 Essential (primary) hypertension: Secondary | ICD-10-CM

## 2020-09-06 DIAGNOSIS — E119 Type 2 diabetes mellitus without complications: Secondary | ICD-10-CM

## 2020-09-06 DIAGNOSIS — E782 Mixed hyperlipidemia: Secondary | ICD-10-CM

## 2020-09-30 ENCOUNTER — Other Ambulatory Visit: Payer: Self-pay

## 2020-09-30 DIAGNOSIS — I10 Essential (primary) hypertension: Secondary | ICD-10-CM

## 2020-10-01 ENCOUNTER — Other Ambulatory Visit: Payer: Self-pay

## 2020-10-01 ENCOUNTER — Ambulatory Visit: Payer: Medicare (Managed Care) | Admitting: Pharmacist

## 2020-10-01 DIAGNOSIS — I10 Essential (primary) hypertension: Secondary | ICD-10-CM

## 2020-10-01 DIAGNOSIS — F39 Unspecified mood [affective] disorder: Secondary | ICD-10-CM

## 2020-10-01 DIAGNOSIS — E119 Type 2 diabetes mellitus without complications: Secondary | ICD-10-CM

## 2020-10-01 DIAGNOSIS — E782 Mixed hyperlipidemia: Secondary | ICD-10-CM

## 2020-10-01 NOTE — Chronic Care Management (AMB) (Signed)
Chronic Care Management Pharmacy  Name: Andre Clements  MRN: [id number] DOB: [birth year]   Chief Complaint/ HPI  Andre Clements,  49 y.o. , male presents for their Initial CCM visit with the clinical pharmacist via telephone due to COVID-19 Pandemic.  PCP : Hoyt Koch, MD Patient Care Team: Hoyt Koch, MD as PCP - General (Internal Medicine) Charlton Haws, [facility] as Pharmacist (Pharmacist)  Their chronic conditions include: Hypertension, Hyperlipidemia and Depression, Paralysis of both lower limbs, hypogonadism  Office Visits: 03/04/20 Dr Sharlet Salina OV: pt cannot take public transit, SCAT forms filled out  01/03/20 NP Jodi Mourning OV: blood in urine x 2 weeks, L flank pain. Rx Bactrim x 5 days. Referred to urology for catheter f/u.  Consult Visit: n/a  No Known Allergies  Medications: Outpatient Encounter Medications as of 10/01/2020  Medication Sig  . Ascorbic Acid (VITAMIN C) 250 MG CHEW Chew 500 each by mouth daily.   . Cholecalciferol (VITAMIN D3) 5000 units CAPS Take 5,000 Units by mouth daily.   . ferrous sulfate (IRON SUPPLEMENT) 325 (65 FE) MG tablet Take 325 mg by mouth daily.   . finasteride (PROSCAR) 5 MG tablet TAKE 1 TABLET BY MOUTH  DAILY  . haloperidol (HALDOL) 5 MG tablet Take 1 tablet (5 mg total) by mouth daily.  Marland Kitchen lisinopril (ZESTRIL) 20 MG tablet TAKE 1 TABLET BY MOUTH  DAILY  . metFORMIN (GLUCOPHAGE-XR) 500 MG 24 hr tablet TAKE 1 TABLET BY MOUTH  DAILY WITH BREAKFAST  . Multiple Vitamins-Minerals (CENTRUM VITAMINTS PO) Take 1 tablet by mouth daily.  Marland Kitchen omeprazole (PRILOSEC) 40 MG capsule TAKE 1 CAPSULE BY MOUTH  DAILY  . sertraline (ZOLOFT) 100 MG tablet Take 2 tablets (200 mg total) by mouth daily.  . simvastatin (ZOCOR) 40 MG tablet TAKE 1 TABLET BY MOUTH  DAILY AT 6 PM  . sulfamethoxazole-trimethoprim (BACTRIM DS) 800-160 MG tablet Take 1 tablet by mouth 2 (two) times daily.   No facility-administered encounter medications on file  as of 10/01/2020.    Wt Readings from Last 3 Encounters:  03/04/20 275 lb 6.4 oz (124.9 kg)  01/03/20 237 lb (107.5 kg)  07/19/19 236 lb (107 kg)    Current Diagnosis/Assessment:  SDOH Interventions     Most Recent Value  SDOH Interventions  Transportation Interventions SCAT ([employer])      Goals Addressed            This Visit's Progress   . Pharmacy Care Plan       CARE PLAN ENTRY (see longitudinal plan of care for additional care plan information)  Current Barriers:  . Chronic Disease Management support, education, and care coordination needs related to Hypertension, Hyperlipidemia, and Depression   Hypertension BP Readings from Last 3 Encounters:  03/04/20 (!) 142/96  01/03/20 140/80  07/19/19 (!) 138/98 .  Pharmacist Clinical Goal(s): o Over the next 180 days, patient will work with PharmD and providers to maintain BP goal <130/80 . Current regimen:  o Lisinopril 20 mg daily . Interventions: o Discussed BP goals and benefits of medications for prevention of heart attack / stroke . Patient self care activities - Over the next 180 days, patient will: o Check BP weekly, document, and provide at future appointments o Ensure daily salt intake < 2300 mg/day  Hyperlipidemia Lab Results  Component Value Date/Time   LDLCALC 48 06/24/2016 09:00 AM   LDLDIRECT 88.0 07/19/2019 01:25 PM .  Pharmacist Clinical Goal(s): o Over the next  180 days, patient will work with PharmD and providers to maintain LDL goal < 100 . Current regimen:  o Simvastatin 40 mg daily . Interventions: o Discussed cholesterol goals and benefits of medications for prevention of heart attack / stroke o Discussed consequences of high triglycerides, and to avoid foods high in triglycerides . Patient self care activities - Over the next 180 days, patient will: o Continue current medications o Reduce triglycerides in diet  Prediabetes Lab Results  Component Value  Date/Time   HGBA1C 5.8 07/19/2019 01:25 PM   HGBA1C 5.6 04/26/2018 02:19 PM .  Pharmacist Clinical Goal(s): o Over the next 180 days, patient will work with PharmD and providers to maintain A1c goal <6.5% . Current regimen:  o Metformin 500 mg daily . Interventions: o Discussed blood sugar goals and benefits of medications for prevention of diabetes Patient self care activities - Over the next 180 days, patient will: o Continue medication as directed o Focus on low sugar/low carb diet  Depression / Bipolar disorder . Pharmacist Clinical Goal(s) o Over the next 180 days, patient will work with PharmD and providers to optimize therapy . Current regimen:  o Sertraline 100 mg - 2 tabs daily o Haloperidol 5 mg every other day . Interventions: o Discussed side effects of haloperidol do include stiffness/movement issues; pt denies these  . Patient self care activities - Over the next 180 days, patient will: o Continue medication as prescribed o Follow up with PCP as scheduled  Medication management . Pharmacist Clinical Goal(s): o Over the next 180 days, patient will work with PharmD and providers to maintain optimal medication adherence . Current pharmacy: [facility] mail order . Interventions o Comprehensive medication review performed. o Continue current medication management strategy . Patient self care activities - Over the next 180 days, patient will: o Focus on medication adherence by pill box and alarm o Take medications as prescribed o Report any questions or concerns to PharmD and/or provider(s)  Initial goal documentation       Hypertension   BP goal is:  <130/80  Office blood pressures are  BP Readings from Last 3 Encounters:  03/04/20 (!) 142/96  01/03/20 140/80  07/19/19 (!) 138/98   Kidney Function Lab Results  Component Value Date/Time   CREATININE 0.75 07/19/2019 01:25 PM   CREATININE 0.88 09/03/2018 11:26 AM   GFR 135.71 07/19/2019 01:25 PM   GFRNONAA  >60 08/27/2018 05:05 AM   GFRAA >60 08/27/2018 05:05 AM   K 4.1 07/19/2019 01:25 PM   K 4.3 09/03/2018 11:26 AM   Patient checks BP at home infrequently Patient home BP readings are ranging: n/a  Patient has failed these meds in the past: n/a Patient is currently controlled on the following medications:  . Lisinopril 20 mg daily  We discussed diet and exercise extensively ; BP goals; benefits of medications  Plan  Continue current medications and control with diet and exercise     Hyperlipidemia   LDL goal < 100  Lipid Panel     Component Value Date/Time   CHOL 169 07/19/2019 1325   TRIG (H) 07/19/2019 1325    510.0 Triglyceride is over 400; calculations on Lipids are invalid.   HDL 33.70 (L) 07/19/2019 1325   LDLCALC 48 06/24/2016 0900   LDLDIRECT 88.0 07/19/2019 1325    Hepatic Function Latest Ref Rng & Units 07/19/2019 09/03/2018 08/25/2018  Total Protein 6.0 - 8.3 g/dL 7.3 7.8 7.1  Albumin 3.5 - 5.2 g/dL 4.3 3.9 3.4(L)  AST 0 - 37 U/L 20 13 14(L)  ALT 0 - 53 U/L 34 12 14  Alk Phosphatase 39 - 117 U/L 62 58 49  Total Bilirubin 0.2 - 1.2 mg/dL 0.3 0.2 0.6  Bilirubin, Direct 0.0 - 0.3 mg/dL - - -     The 10-year ASCVD risk score Mikey Bussing DC Jr., et al., 2013) is: 17.8%   Values used to calculate the score:     Age: 56 years     Sex: Male     Is Non-Hispanic African American: Yes     Diabetic: Yes     Tobacco smoker: No     Systolic Blood Pressure: 725 mmHg     Is BP treated: Yes     HDL Cholesterol: 33.7 mg/dL     Total Cholesterol: 169 mg/dL   Patient has failed these meds in past: n/a Patient is currently controlled on the following medications:  . Simvastatin 40 mg daily  We discussed:  diet and exercise extensively ; Cholesterol goals; benefits of statin for ASCVD risk reduction; consequences of elevated triglycerides; reduce high trig food  Plan  Continue current medications and control with diet and exercise  Depression   Depression screen [facility] 2/9  10/03/2019 10/01/2018  Decreased Interest 0 0  Down, Depressed, Hopeless 0 0  PHQ - 2 Score 0 0  Altered sleeping 0 0  Tired, decreased energy 0 0  Change in appetite 0 0  Feeling bad or failure about yourself  0 0  Trouble concentrating 0 0  Moving slowly or fidgety/restless 0 0  Suicidal thoughts 0 0  PHQ-9 Score 0 0  Difficult doing work/chores Not difficult at all Not difficult at all    Patient has failed these meds in past: n/a Patient is currently controlled on the following medications:  . Sertraline 100 mg - 2 tabs daily . Haloperidol 5 mg every other day  We discussed: Pt reports his mood is well controlled on current regimen; he takes haldol QOD because he initially had neck stiffness taking it every day, but moving to QOD he does not have neck stiffness and mood is stable.  Plan  Continue current medications  Prediabetes   A1c goal <6.5%  Recent Relevant Labs: Lab Results  Component Value Date/Time   HGBA1C 5.8 07/19/2019 01:25 PM   HGBA1C 5.6 04/26/2018 02:19 PM   GFR 135.71 07/19/2019 01:25 PM   GFR 120.41 09/03/2018 11:26 AM    Last diabetic Eye exam:  Lab Results  Component Value Date/Time   HMDIABEYEEXA No Retinopathy 07/08/2018 12:00 AM    Last diabetic Foot exam: No results found for: HMDIABFOOTEX   Checking BG: Never   Patient has failed these meds in past: n/a Patient is currently controlled on the following medications: Marland Kitchen Metformin ER 500 mg daily  We discussed: diet and exercise extensively; blood sugar goals; importance of preventing progression to diabetes  Plan  Continue current medications and control with diet and exercise  GERD   Patient has failed these meds in past: n/a Patient is currently controlled on the following medications:  . Omeprazole 40 mg daily  We discussed:  Patient is satisfied with current regimen and denies issues; pt does not require OTC acid relievers for breakthrough symptoms  Plan  Continue current  medications  Hair loss   Lab Results  Component Value Date/Time   PSA 1.08 03/01/2016 02:57 PM   Patient has failed these meds in past: n/a Patient is currently controlled on the  following medications:  . Finasteride 5 mg every other day  We discussed:  Patient is satisfied with current regimen and denies issues  Plan  Continue current medications  Anemia   CBC Latest Ref Rng & Units 07/19/2019 09/03/2018 08/27/2018  WBC 4.0 - 10.5 K/uL 9.2 13.5(H) 13.6(H)  Hemoglobin 13.0 - 17.0 g/dL 11.8(L) 11.1(L) 9.1(L)  Hematocrit 39 - 52 % 36.6(L) 34.5(L) 29.3(L)  Platelets 150 - 400 K/uL 300.0 498.0(H) 329   Iron/TIBC/Ferritin/ %Sat    Component Value Date/Time   IRON 46 03/01/2016 1457   IRONPCTSAT 12.3 (L) 03/01/2016 1457   Patient has failed these meds in past: n/a Patient is currently controlled on the following medications:  . Ferrous sulfate 325 mg daily 6pm  We discussed:  Patient is satisfied with current regimen and denies issues; iron panel is old, but HGB has been stable last 2 years  Plan  Continue current medications  Vaccines   Reviewed and discussed patient's vaccination history.    Immunization History  Administered Date(s) Administered  . Influenza,inj,Quad PF,6+ Mos 09/16/2015, 09/27/2016, 09/26/2017, 09/03/2018, 03/04/2020  . Pneumococcal Polysaccharide-23 12/15/2015   Pt reports he got COVID vaccine at [facility] in the spring. Pfizer: 04/01/20 and 04/22/20  Plan  Recommended patient receive influenza vaccine in office and COVID booster dose after 10/23/20 at [facility] Maintenance   Patient is currently controlled on the following medications:  Marland Kitchen Vitamin C 500 mg daily . Vitamin D 5000 IU daily . Multivitamin . Advil PM for sleep   We discussed:  Patient is satisfied with current regimen and denies issues    Plan  Continue current medications  Medication Management   Pt uses mail order pharmacy for all medications Uses pill box? Yes  - 1 month box, sets alarm at 6pm as well Pt endorses 100% compliance  We discussed: Current pharmacy is preferred with insurance plan and patient is satisfied with pharmacy services  Plan  Continue current medication management strategy    Follow up: 6 month phone visit  Charlene Brooke, PharmD, BCACP Clinical Pharmacist [facility] at [facility] [phone]

## 2020-10-01 NOTE — Patient Instructions (Addendum)
Visit Information  Phone number for Pharmacist: [phone]  Thank you for meeting with me to discuss your medications! I look forward to working with you to achieve your health care goals. Below is a summary of what we talked about during the visit:  Goals Addressed            This Visit's Progress   . Pharmacy Care Plan       CARE PLAN ENTRY (see longitudinal plan of care for additional care plan information)  Current Barriers:  . Chronic Disease Management support, education, and care coordination needs related to Hypertension, Hyperlipidemia, Prediabetes, and Depression   Hypertension BP Readings from Last 3 Encounters:  03/04/20 (!) 142/96  01/03/20 140/80  07/19/19 (!) 138/98 .  Pharmacist Clinical Goal(s): o Over the next 180 days, patient will work with PharmD and providers to maintain BP goal <130/80 . Current regimen:  o Lisinopril 20 mg daily . Interventions: o Discussed BP goals and benefits of medications for prevention of heart attack / stroke . Patient self care activities - Over the next 180 days, patient will: o Check BP weekly, document, and provide at future appointments o Ensure daily salt intake < 2300 mg/day  Hyperlipidemia Lab Results  Component Value Date/Time   LDLCALC 48 06/24/2016 09:00 AM   LDLDIRECT 88.0 07/19/2019 01:25 PM .  Pharmacist Clinical Goal(s): o Over the next 180 days, patient will work with PharmD and providers to maintain LDL goal < 100 . Current regimen:  o Simvastatin 40 mg daily . Interventions: o Discussed cholesterol goals and benefits of medications for prevention of heart attack / stroke o Discussed consequences of high triglycerides, and to avoid foods high in triglycerides . Patient self care activities - Over the next 180 days, patient will: o Continue current medications o Reduce triglycerides in diet  Prediabetes Lab Results  Component Value Date/Time   HGBA1C 5.8 07/19/2019 01:25 PM   HGBA1C 5.6 04/26/2018  02:19 PM .  Pharmacist Clinical Goal(s): o Over the next 180 days, patient will work with PharmD and providers to maintain A1c goal <6.5% . Current regimen:  o Metformin 500 mg daily . Interventions: o Discussed blood sugar goals and benefits of medications for prevention of diabetes Patient self care activities - Over the next 180 days, patient will: o Continue medication as directed o Focus on low sugar/low carb diet  Depression / Bipolar disorder . Pharmacist Clinical Goal(s) o Over the next 180 days, patient will work with PharmD and providers to optimize therapy . Current regimen:  o Sertraline 100 mg - 2 tabs daily o Haloperidol 5 mg every other day . Interventions: o Discussed side effects of haloperidol do include stiffness/movement issues; pt denies these  . Patient self care activities - Over the next 180 days, patient will: o Continue medication as prescribed o Follow up with PCP as scheduled  Medication management . Pharmacist Clinical Goal(s): o Over the next 180 days, patient will work with PharmD and providers to maintain optimal medication adherence . Current pharmacy: [facility] mail order . Interventions o Comprehensive medication review performed. o Continue current medication management strategy . Patient self care activities - Over the next 180 days, patient will: o Focus on medication adherence by pill box and alarm o Take medications as prescribed o Report any questions or concerns to PharmD and/or provider(s)  Initial goal documentation      Andre Clements was given information about Chronic Care Management services today including:  1. CCM service  includes personalized support from designated clinical staff supervised by his physician, including individualized plan of care and coordination with other care providers 2. 24/7 contact phone numbers for assistance for urgent and routine care needs. 3. Standard insurance, coinsurance, copays and deductibles apply  for chronic care management only during months in which we provide at least 20 minutes of these services. Most insurances cover these services at 100%, however patients may be responsible for any copay, coinsurance and/or deductible if applicable. This service may help you avoid the need for more expensive face-to-face services. 4. Only one practitioner may furnish and bill the service in a calendar month. 5. The patient may stop CCM services at any time (effective at the end of the month) by phone call to the office staff.  Patient agreed to services and verbal consent obtained.   Patient verbalizes understanding of instructions provided today.  The pharmacy team will reach out to the patient again over the next 180 days.   Charlene Brooke, PharmD, BCACP Clinical Pharmacist [facility] at [facility] [phone]  High Triglycerides Eating Plan Triglycerides are a type of fat in the blood. High levels of triglycerides can increase your risk of heart disease and stroke. If your triglyceride levels are high, choosing the right foods can help lower your triglycerides and keep your heart healthy. Work with your health care provider or a diet and nutrition specialist (dietitian) to develop an eating plan that is right for you. What are tips for following this plan? General guidelines   Lose weight, if you are overweight. For most people, losing 5-10 lbs (2-5 kg) helps lower triglyceride levels. A weight-loss plan may include. ? 30 minutes of exercise at least 5 days a week. ? Reducing the amount of calories, sugar, and fat you eat.  Eat a wide variety of fresh fruits, vegetables, and whole grains. These foods are high in fiber.  Eat foods that contain healthy fats, such as fatty fish, nuts, seeds, and olive oil.  Avoid foods that are high in added sugar, added salt (sodium), saturated fat, and trans fat.  Avoid low-fiber, refined carbohydrates such as white bread, crackers,  noodles, and white rice.  Avoid foods with partially hydrogenated oils (trans fats), such as fried foods or stick margarine.  Limit alcohol intake to no more than 1 drink a day for nonpregnant women and 2 drinks a day for men. One drink equals 12 oz of beer, 5 oz of wine, or 1 oz of hard liquor. Your health care provider may recommend that you drink less depending on your overall health. Reading food labels  Check food labels for the amount of saturated fat. Choose foods with no or very little saturated fat.  Check food labels for the amount of trans fat. Choose foods with no trans fat.  Check food labels for the amount of cholesterol. Choose foods low in cholesterol. Ask your dietitian how much cholesterol you should have each day.  Check food labels for the amount of sodium. Choose foods with less than 140 milligrams (mg) per serving. Shopping  Buy dairy products labeled as nonfat (skim) or low-fat (1%).  Avoid buying processed or prepackaged foods. These are often high in added sugar, sodium, and fat. Cooking  Choose healthy fats when cooking, such as olive oil or canola oil.  Cook foods using lower fat methods, such as baking, broiling, boiling, or grilling.  Make your own sauces, dressings, and marinades when possible, instead of buying them. Store-bought sauces, dressings,  and marinades are often high in sodium and sugar. Meal planning  Eat more home-cooked food and less restaurant, buffet, and fast food.  Eat fatty fish at least 2 times each week. Examples of fatty fish include salmon, trout, mackerel, tuna, and herring.  If you eat whole eggs, do not eat more than 3 egg yolks per week. What foods are recommended? The items listed may not be a complete list. Talk with your dietitian about what dietary choices are best for you. Grains Whole wheat or whole grain breads, crackers, cereals, and pasta. Unsweetened oatmeal. Bulgur. Barley. Quinoa. Brown rice. Whole wheat flour  tortillas. Vegetables Fresh or frozen vegetables. Low-sodium canned vegetables. Fruits All fresh, canned (in natural juice), or frozen fruits. Meats and other protein foods Skinless chicken or [place]. Ground chicken or [place]. Lean cuts of pork, trimmed of fat. Fish and seafood, especially salmon, trout, and herring. Egg whites. Dried beans, peas, or lentils. Unsalted nuts or seeds. Unsalted canned beans. Natural peanut or almond butter. Dairy Low-fat dairy products. Skim or low-fat (1%) milk. Reduced fat (2%) and low-sodium cheese. Low-fat ricotta cheese. Low-fat cottage cheese. Plain, low-fat yogurt. Fats and oils Tub margarine without trans fats. Light or reduced-fat mayonnaise. Light or reduced-fat salad dressings. Avocado. Safflower, olive, sunflower, soybean, and canola oils. What foods are not recommended? The items listed may not be a complete list. Talk with your dietitian about what dietary choices are best for you. Grains White bread. White (regular) pasta. White rice. Cornbread. Bagels. Pastries. Crackers that contain trans fat. Vegetables Creamed or fried vegetables. Vegetables in a cheese sauce. Fruits Sweetened dried fruit. Canned fruit in syrup. Fruit juice. Meats and other protein foods Fatty cuts of meat. Ribs. Chicken wings. Berniece Salines. Sausage. Bologna. Salami. Chitterlings. Fatback. Hot dogs. Bratwurst. Packaged lunch meats. Dairy Whole or reduced-fat (2%) milk. Half-and-half. Cream cheese. Full-fat or sweetened yogurt. Full-fat cheese. Nondairy creamers. Whipped toppings. Processed cheese or cheese spreads. Cheese curds. Beverages Alcohol. Sweetened drinks, such as soda, lemonade, fruit drinks, or punches. Fats and oils Butter. Stick margarine. Lard. Shortening. Ghee. Bacon fat. Tropical oils, such as coconut, palm kernel, or palm oils. Sweets and desserts Corn syrup. Sugars. Honey. Molasses. Candy. Jam and jelly. Syrup. Sweetened cereals. Cookies. Pies. Cakes. Donuts.  Muffins. Ice cream. Condiments Store-bought sauces, dressings, and marinades that are high in sugar, such as ketchup and barbecue sauce. Summary  High levels of triglycerides can increase the risk of heart disease and stroke. Choosing the right foods can help lower your triglycerides.  Eat plenty of fresh fruits, vegetables, and whole grains. Choose low-fat dairy and lean meats. Eat fatty fish at least twice a week.  Avoid processed and prepackaged foods with added sugar, sodium, saturated fat, and trans fat.  If you need suggestions or have questions about what types of food are good for you, talk with your health care provider or a dietitian. This information is not intended to replace advice given to you by your health care provider. Make sure you discuss any questions you have with your health care provider. Document Revised: 11/17/2017 Document Reviewed: 02/07/2017 Elsevier Patient Education  2020 [employer].

## 2020-10-20 ENCOUNTER — Telehealth: Payer: Self-pay | Admitting: Internal Medicine

## 2020-10-20 DIAGNOSIS — G8220 Paraplegia, unspecified: Secondary | ICD-10-CM

## 2020-10-20 NOTE — Telephone Encounter (Signed)
Patient called and was wanting to know if Dr. Sharlet Salina could write him another prescription for his medical supplies, he recently just changed his insurance to [facility] [employer]. It can be sent to [facility], [place] [address]

## 2020-10-21 NOTE — Telephone Encounter (Signed)
I called the pt for clarification. He needs catheters, leg bags and leg ties. He has been informed that PCP is out on leave. Please advise.

## 2020-10-22 NOTE — Telephone Encounter (Signed)
Follow up message  Patient calling back, states the correct company to use for supplies is [facility] Phone [phone] Fax [phone] The previous company is not in network([employer])  Advised patient to keep 11/10 appointment to discuss with Dr Sharlet Salina

## 2020-10-22 NOTE — Telephone Encounter (Signed)
Would prefer that he discuss with Dr. Sharlet Salina at his OV next week who knows what supplies he needs. Keep the appointment with her for the 10th please.

## 2020-10-26 NOTE — Telephone Encounter (Signed)
printed

## 2020-10-26 NOTE — Addendum Note (Signed)
Addended by: Binnie Rail on: 10/26/2020 05:01 PM   Modules accepted: Orders

## 2020-10-26 NOTE — Telephone Encounter (Signed)
We will likely need more specifics about size of catheter, etc.  Does he have a catheter size at least

## 2020-10-26 NOTE — Telephone Encounter (Signed)
Patient calling back stating he will run out of supplies before his appointment with Dr. Sharlet Salina on 11.10.21

## 2020-10-26 NOTE — Addendum Note (Signed)
Addended by: Binnie Rail on: 10/26/2020 12:16 PM   Modules accepted: Orders

## 2020-10-28 ENCOUNTER — Ambulatory Visit (INDEPENDENT_AMBULATORY_CARE_PROVIDER_SITE_OTHER): Payer: Medicare (Managed Care) | Admitting: Internal Medicine

## 2020-10-28 ENCOUNTER — Encounter: Payer: Self-pay | Admitting: Internal Medicine

## 2020-10-28 ENCOUNTER — Other Ambulatory Visit: Payer: Self-pay

## 2020-10-28 ENCOUNTER — Telehealth: Payer: Self-pay | Admitting: Internal Medicine

## 2020-10-28 ENCOUNTER — Ambulatory Visit (INDEPENDENT_AMBULATORY_CARE_PROVIDER_SITE_OTHER): Payer: Medicare (Managed Care)

## 2020-10-28 VITALS — BP 170/90 | HR 84 | Temp 98.3°F | Ht 68.0 in | Wt 267.4 lb

## 2020-10-28 VITALS — BP 144/100 | HR 83 | Temp 99.0°F | Ht 68.0 in | Wt 267.0 lb

## 2020-10-28 DIAGNOSIS — I10 Essential (primary) hypertension: Secondary | ICD-10-CM

## 2020-10-28 DIAGNOSIS — R7301 Impaired fasting glucose: Secondary | ICD-10-CM

## 2020-10-28 DIAGNOSIS — Z23 Encounter for immunization: Secondary | ICD-10-CM

## 2020-10-28 DIAGNOSIS — Z0000 Encounter for general adult medical examination without abnormal findings: Secondary | ICD-10-CM

## 2020-10-28 DIAGNOSIS — G8220 Paraplegia, unspecified: Secondary | ICD-10-CM

## 2020-10-28 DIAGNOSIS — F39 Unspecified mood [affective] disorder: Secondary | ICD-10-CM

## 2020-10-28 DIAGNOSIS — E782 Mixed hyperlipidemia: Secondary | ICD-10-CM

## 2020-10-28 LAB — LIPID PANEL
Cholesterol: 153 mg/dL (ref 0–200)
HDL: 36.9 mg/dL — ABNORMAL LOW (ref >39.00)
NonHDL: 115.67
Total CHOL/HDL Ratio: 4
Triglycerides: 327 mg/dL — ABNORMAL HIGH (ref 0.0–149.0)
VLDL: 65.4 mg/dL — ABNORMAL HIGH (ref 0.0–40.0)

## 2020-10-28 LAB — COMPREHENSIVE METABOLIC PANEL
ALT: 18 U/L (ref 0–53)
AST: 15 U/L (ref 0–37)
Albumin: 4.2 g/dL (ref 3.5–5.2)
Alkaline Phosphatase: 51 U/L (ref 39–117)
BUN: 16 mg/dL (ref 6–23)
CO2: 29 mEq/L (ref 19–32)
Calcium: 9.4 mg/dL (ref 8.4–10.5)
Chloride: 105 mEq/L (ref 96–112)
Creatinine, Ser: 0.79 mg/dL (ref 0.40–1.50)
GFR: 106.02 mL/min (ref >60.00)
Glucose, Bld: 99 mg/dL (ref 70–99)
Potassium: 3.8 mEq/L (ref 3.5–5.1)
Sodium: 141 mEq/L (ref 135–145)
Total Bilirubin: 0.3 mg/dL (ref 0.2–1.2)
Total Protein: 7.1 g/dL (ref 6.0–8.3)

## 2020-10-28 LAB — HEMOGLOBIN A1C: Hgb A1c MFr Bld: 5.9 % (ref 4.6–6.5)

## 2020-10-28 LAB — CBC
HCT: 36 % — ABNORMAL LOW (ref 39.0–52.0)
Hemoglobin: 11.5 g/dL — ABNORMAL LOW (ref 13.0–17.0)
MCHC: 32 g/dL (ref 30.0–36.0)
MCV: 63.3 fl — ABNORMAL LOW (ref 78.0–100.0)
Platelets: 279 10*3/uL (ref 150.0–400.0)
RBC: 5.68 Mil/uL (ref 4.22–5.81)
RDW: 17.7 % — ABNORMAL HIGH (ref 11.5–15.5)
WBC: 7.3 10*3/uL (ref 4.0–10.5)

## 2020-10-28 LAB — LDL CHOLESTEROL, DIRECT: Direct LDL: 77 mg/dL

## 2020-10-28 NOTE — Telephone Encounter (Signed)
[employer] calling to get a expedited authorization for a condom catheter per the patients request  [employer]  #[id number] option 2

## 2020-10-28 NOTE — Progress Notes (Signed)
   Subjective:   Patient ID: Andre Clements, male    DOB: [birth year], 49 y.o.   MRN: [id number]  HPI The patient is a 49 YO man coming in for physical.   PMH, [place], social history reviewed and updated  Review of Systems  Constitutional: Negative.   HENT: Negative.   Eyes: Negative.   Respiratory: Negative for cough, chest tightness and shortness of breath.   Cardiovascular: Negative for chest pain, palpitations and leg swelling.  Gastrointestinal: Negative for abdominal distention, abdominal pain, constipation, diarrhea, nausea and vomiting.  Musculoskeletal: Positive for gait problem.  Skin: Negative.   Neurological: Positive for weakness.  Psychiatric/Behavioral: Negative.     Objective:  Physical Exam Constitutional:      Appearance: He is well-developed.  HENT:     Head: Normocephalic and atraumatic.  Cardiovascular:     Rate and Rhythm: Normal rate and regular rhythm.  Pulmonary:     Effort: Pulmonary effort is normal. No respiratory distress.     Breath sounds: Normal breath sounds. No wheezing or rales.  Abdominal:     General: Bowel sounds are normal. There is no distension.     Palpations: Abdomen is soft.     Tenderness: There is no abdominal tenderness. There is no rebound.  Musculoskeletal:     Cervical back: Normal range of motion.  Skin:    General: Skin is warm and dry.  Neurological:     Mental Status: He is alert and oriented to person, place, and time. Mental status is at baseline.     Cranial Nerves: Cranial nerve deficit present.     Sensory: Sensory deficit present.     Coordination: Coordination abnormal.     Gait: Gait abnormal.     Vitals:   10/28/20 1041  BP: (!) 144/100  Pulse: 83  Temp: 99 F (37.2 C)  TempSrc: Oral  SpO2: 96%  Weight: 267 lb (121.1 kg)  Height: 5\' 8"  (1.727 m)    This visit occurred during the SARS-CoV-2 public health emergency.  Safety protocols were in place, including screening questions prior to the visit,  additional usage of staff PPE, and extensive cleaning of exam room while observing appropriate contact time as indicated for disinfecting solutions.   Assessment & Plan:  Flu shot given at visit

## 2020-10-28 NOTE — Patient Instructions (Addendum)
Mr. Stoneking , Thank you for taking time to come for your Medicare Wellness Visit. I appreciate your ongoing commitment to your health goals. Please review the following plan we discussed and let me know if I can assist you in the future.   Screening recommendations/referrals: Colonoscopy: never done Recommended yearly ophthalmology/optometry visit for glaucoma screening and checkup Recommended yearly dental visit for hygiene and checkup  Vaccinations: Influenza vaccine: 10/28/2020 Pneumococcal vaccine: need Prevnar 13 Tdap vaccine: never done Shingles vaccine: never done   Covid-19: up to date   Advanced directives: Advance directive discussed with you today. Even though you declined this today please call our office should you change your mind and we can give you the proper paperwork for you to fill out.  Conditions/risks identified: Yes. Reviewed health maintenance screenings with patient today and relevant education, vaccines, and/or referrals were provided. Continue doing brain stimulating activities (puzzles, reading, adult coloring books, staying active) to keep memory sharp. Continue to eat heart healthy diet (full of fruits, vegetables, whole grains, lean protein, water--limit salt, fat, and sugar intake) and increase physical activity as tolerated.  Next appointment: Please schedule your next Medicare Wellness Visit with your Nurse Health Advisor in 1 year by calling [phone].  Preventive Care 40-64 Years, Male Preventive care refers to lifestyle choices and visits with your health care provider that can promote health and wellness. What does preventive care include?  A yearly physical exam. This is also called an annual well check.  Dental exams once or twice a year.  Routine eye exams. Ask your health care provider how often you should have your eyes checked.  Personal lifestyle choices, including:  Daily care of your teeth and gums.  Regular physical  activity.  Eating a healthy diet.  Avoiding tobacco and drug use.  Limiting alcohol use.  Practicing safe sex.  Taking low-dose aspirin every day starting at age 19. What happens during an annual well check? The services and screenings done by your health care provider during your annual well check will depend on your age, overall health, lifestyle risk factors, and family history of disease. Counseling  Your health care provider may ask you questions about your:  Alcohol use.  Tobacco use.  Drug use.  Emotional well-being.  Home and relationship well-being.  Sexual activity.  Eating habits.  Work and work Statistician. Screening  You may have the following tests or measurements:  Height, weight, and BMI.  Blood pressure.  Lipid and cholesterol levels. These may be checked every 5 years, or more frequently if you are over 1 years old.  Skin check.  Lung cancer screening. You may have this screening every year starting at age 66 if you have a 30-pack-year history of smoking and currently smoke or have quit within the past 15 years.  Fecal occult blood test (FOBT) of the stool. You may have this test every year starting at age 65.  Flexible sigmoidoscopy or colonoscopy. You may have a sigmoidoscopy every 5 years or a colonoscopy every 10 years starting at age 106.  Prostate cancer screening. Recommendations will vary depending on your family history and other risks.  Hepatitis C blood test.  Hepatitis B blood test.  Sexually transmitted disease (STD) testing.  Diabetes screening. This is done by checking your blood sugar (glucose) after you have not eaten for a while (fasting). You may have this done every 1-3 years. Discuss your test results, treatment options, and if necessary, the need for more tests with your health  care provider. Vaccines  Your health care provider may recommend certain vaccines, such as:  Influenza vaccine. This is recommended every  year.  Tetanus, diphtheria, and acellular pertussis (Tdap, Td) vaccine. You may need a Td booster every 10 years.  Zoster vaccine. You may need this after age 102.  Pneumococcal 13-valent conjugate (PCV13) vaccine. You may need this if you have certain conditions and have not been vaccinated.  Pneumococcal polysaccharide (PPSV23) vaccine. You may need one or two doses if you smoke cigarettes or if you have certain conditions. Talk to your health care provider about which screenings and vaccines you need and how often you need them. This information is not intended to replace advice given to you by your health care provider. Make sure you discuss any questions you have with your health care provider. Document Released: 01/01/2016 Document Revised: 08/24/2016 Document Reviewed: 10/06/2015 Elsevier Interactive Patient Education  2017 [employer] Prevention in the Home Falls can cause injuries. They can happen to people of all ages. There are many things you can do to make your home safe and to help prevent falls. What can I do on the outside of my home?  Regularly fix the edges of walkways and driveways and fix any cracks.  Remove anything that might make you trip as you walk through a door, such as a raised step or threshold.  Trim any bushes or trees on the path to your home.  Use bright outdoor lighting.  Clear any walking paths of anything that might make someone trip, such as rocks or tools.  Regularly check to see if handrails are loose or broken. Make sure that both sides of any steps have handrails.  Any raised decks and porches should have guardrails on the edges.  Have any leaves, snow, or ice cleared regularly.  Use sand or salt on walking paths during winter.  Clean up any spills in your garage right away. This includes oil or grease spills. What can I do in the bathroom?  Use night lights.  Install grab bars by the toilet and in the tub and shower. Do not  use towel bars as grab bars.  Use non-skid mats or decals in the tub or shower.  If you need to sit down in the shower, use a plastic, non-slip stool.  Keep the floor dry. Clean up any water that spills on the floor as soon as it happens.  Remove soap buildup in the tub or shower regularly.  Attach bath mats securely with double-sided non-slip rug tape.  Do not have throw rugs and other things on the floor that can make you trip. What can I do in the bedroom?  Use night lights.  Make sure that you have a light by your bed that is easy to reach.  Do not use any sheets or blankets that are too big for your bed. They should not hang down onto the floor.  Have a firm chair that has side arms. You can use this for support while you get dressed.  Do not have throw rugs and other things on the floor that can make you trip. What can I do in the kitchen?  Clean up any spills right away.  Avoid walking on wet floors.  Keep items that you use a lot in easy-to-reach places.  If you need to reach something above you, use a strong step stool that has a grab bar.  Keep electrical cords out of the way.  Do  not use floor polish or wax that makes floors slippery. If you must use wax, use non-skid floor wax.  Do not have throw rugs and other things on the floor that can make you trip. What can I do with my stairs?  Do not leave any items on the stairs.  Make sure that there are handrails on both sides of the stairs and use them. Fix handrails that are broken or loose. Make sure that handrails are as long as the stairways.  Check any carpeting to make sure that it is firmly attached to the stairs. Fix any carpet that is loose or worn.  Avoid having throw rugs at the top or bottom of the stairs. If you do have throw rugs, attach them to the floor with carpet tape.  Make sure that you have a light switch at the top of the stairs and the bottom of the stairs. If you do not have them, ask  someone to add them for you. What else can I do to help prevent falls?  Wear shoes that:  Do not have high heels.  Have rubber bottoms.  Are comfortable and fit you well.  Are closed at the toe. Do not wear sandals.  If you use a stepladder:  Make sure that it is fully opened. Do not climb a closed stepladder.  Make sure that both sides of the stepladder are locked into place.  Ask someone to hold it for you, if possible.  Clearly mark and make sure that you can see:  Any grab bars or handrails.  First and last steps.  Where the edge of each step is.  Use tools that help you move around (mobility aids) if they are needed. These include:  Canes.  Walkers.  Scooters.  Crutches.  Turn on the lights when you go into a dark area. Replace any light bulbs as soon as they burn out.  Set up your furniture so you have a clear path. Avoid moving your furniture around.  If any of your floors are uneven, fix them.  If there are any pets around you, be aware of where they are.  Review your medicines with your doctor. Some medicines can make you feel dizzy. This can increase your chance of falling. Ask your doctor what other things that you can do to help prevent falls. This information is not intended to replace advice given to you by your health care provider. Make sure you discuss any questions you have with your health care provider. Document Released: 10/01/2009 Document Revised: 05/12/2016 Document Reviewed: 01/09/2015 Elsevier Interactive Patient Education  2017 [employer].
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
## 2020-10-28 NOTE — Patient Instructions (Signed)
We have given you the flu shot today.   Health Maintenance, Male Adopting a healthy lifestyle and getting preventive care are important in promoting health and wellness. Ask your health care provider about:  The right schedule for you to have regular tests and exams.  Things you can do on your own to prevent diseases and keep yourself healthy. What should I know about diet, weight, and exercise? Eat a healthy diet   Eat a diet that includes plenty of vegetables, fruits, low-fat dairy products, and lean protein.  Do not eat a lot of foods that are high in solid fats, added sugars, or sodium. Maintain a healthy weight Body mass index (BMI) is a measurement that can be used to identify possible weight problems. It estimates body fat based on height and weight. Your health care provider can help determine your BMI and help you achieve or maintain a healthy weight. Get regular exercise Get regular exercise. This is one of the most important things you can do for your health. Most adults should:  Exercise for at least 150 minutes each week. The exercise should increase your heart rate and make you sweat (moderate-intensity exercise).  Do strengthening exercises at least twice a week. This is in addition to the moderate-intensity exercise.  Spend less time sitting. Even light physical activity can be beneficial. Watch cholesterol and blood lipids Have your blood tested for lipids and cholesterol at 47 years of age, then have this test every 5 years. You may need to have your cholesterol levels checked more often if:  Your lipid or cholesterol levels are high.  You are older than 47 years of age.  You are at high risk for heart disease. What should I know about cancer screening? Many types of cancers can be detected early and may often be prevented. Depending on your health history and family history, you may need to have cancer screening at various ages. This may include screening  for:  Colorectal cancer.  Prostate cancer.  Skin cancer.  Lung cancer. What should I know about heart disease, diabetes, and high blood pressure? Blood pressure and heart disease  High blood pressure causes heart disease and increases the risk of stroke. This is more likely to develop in people who have high blood pressure readings, are of African descent, or are overweight.  Talk with your health care provider about your target blood pressure readings.  Have your blood pressure checked: ? Every 3-5 years if you are 14-59 years of age. ? Every year if you are 23 years old or older.  If you are between the ages of 70 and 72 and are a current or former smoker, ask your health care provider if you should have a one-time screening for abdominal aortic aneurysm (AAA). Diabetes Have regular diabetes screenings. This checks your fasting blood sugar level. Have the screening done:  Once every three years after age 64 if you are at a normal weight and have a low risk for diabetes.  More often and at a younger age if you are overweight or have a high risk for diabetes. What should I know about preventing infection? Hepatitis B If you have a higher risk for hepatitis B, you should be screened for this virus. Talk with your health care provider to find out if you are at risk for hepatitis B infection. Hepatitis C Blood testing is recommended for:  Everyone born from [birth year] through 1963.  Anyone with known risk factors for hepatitis C. Sexually  transmitted infections (STIs)  You should be screened each year for STIs, including gonorrhea and chlamydia, if: ? You are sexually active and are younger than 47 years of age. ? You are older than 47 years of age and your health care provider tells you that you are at risk for this type of infection. ? Your sexual activity has changed since you were last screened, and you are at increased risk for chlamydia or gonorrhea. Ask your health care  provider if you are at risk.  Ask your health care provider about whether you are at high risk for HIV. Your health care provider may recommend a prescription medicine to help prevent HIV infection. If you choose to take medicine to prevent HIV, you should first get tested for HIV. You should then be tested every 3 months for as long as you are taking the medicine. Follow these instructions at home: Lifestyle  Do not use any products that contain nicotine or tobacco, such as cigarettes, e-cigarettes, and chewing tobacco. If you need help quitting, ask your health care provider.  Do not use street drugs.  Do not share needles.  Ask your health care provider for help if you need support or information about quitting drugs. Alcohol use  Do not drink alcohol if your health care provider tells you not to drink.  If you drink alcohol: ? Limit how much you have to 0-2 drinks a day. ? Be aware of how much alcohol is in your drink. In the [place] one drink equals one 12 oz bottle of beer (355 mL), one 5 oz glass of wine (148 mL), or one 1 oz glass of hard liquor (44 mL). General instructions  Schedule regular health, dental, and eye exams.  Stay current with your vaccines.  Tell your health care provider if: ? You often feel depressed. ? You have ever been abused or do not feel safe at home. Summary  Adopting a healthy lifestyle and getting preventive care are important in promoting health and wellness.  Follow your health care provider's instructions about healthy diet, exercising, and getting tested or screened for diseases.  Follow your health care provider's instructions on monitoring your cholesterol and blood pressure. This information is not intended to replace advice given to you by your health care provider. Make sure you discuss any questions you have with your health care provider. Document Revised: 11/28/2018 Document Reviewed: 11/28/2018 Elsevier Patient Education  2020  [employer].

## 2020-10-28 NOTE — Telephone Encounter (Signed)
    Patient calling for status of order for supplies to [facility]

## 2020-10-28 NOTE — Telephone Encounter (Signed)
Follow up message   Patient calling, states he was just told [facility] does not service [place] Patient is reaching out to  [facility] to get additional information on vendors

## 2020-10-29 NOTE — Assessment & Plan Note (Signed)
Checking lipid panel and adjust simvastatin as needed.  

## 2020-10-29 NOTE — Assessment & Plan Note (Signed)
Flu shot given. Covid-19 up to date. Tetanus up to date. Counseled about sun safety and mole surveillance. Counseled about the dangers of distracted driving. Given 10 year screening recommendations.

## 2020-10-29 NOTE — Assessment & Plan Note (Signed)
Checking HgA1c and adjust as needed.  

## 2020-10-29 NOTE — Assessment & Plan Note (Signed)
Stable with haldol daily. If change he agrees to return to psych.
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
## 2020-10-29 NOTE — Telephone Encounter (Signed)
Fine to give verbals for those. See also another encounter with type and number of supplies needed.

## 2020-10-29 NOTE — Assessment & Plan Note (Signed)
BP mildly high today and he has been eating more sodium lately. Will cut back. Checking CMP. If recheck still high next visit can adjust dosing lisinopril.

## 2020-11-02 NOTE — Telephone Encounter (Signed)
Called to give orders, [facility] stated that they can not do any authorizations at the moment do to an update and asked me to call back in about two hours.

## 2020-11-03 ENCOUNTER — Telehealth: Payer: Self-pay | Admitting: Internal Medicine

## 2020-11-03 NOTE — Telephone Encounter (Signed)
    Forms from [facility] received, placed in Dr Charlynne Cousins box for signature

## 2020-11-20 ENCOUNTER — Telehealth: Payer: Self-pay | Admitting: Internal Medicine

## 2020-11-20 DIAGNOSIS — F251 Schizoaffective disorder, depressive type: Secondary | ICD-10-CM

## 2020-11-20 DIAGNOSIS — E119 Type 2 diabetes mellitus without complications: Secondary | ICD-10-CM

## 2020-11-20 DIAGNOSIS — I10 Essential (primary) hypertension: Secondary | ICD-10-CM

## 2020-11-20 DIAGNOSIS — E782 Mixed hyperlipidemia: Secondary | ICD-10-CM

## 2020-11-20 NOTE — Telephone Encounter (Signed)
Patient called and said he recently switched to [facility] and he is needing his prescriptions to [facility]  [address] [place] [address]

## 2020-11-23 MED ORDER — LISINOPRIL 20 MG PO TABS: 20.0000 mg | ORAL_TABLET | Freq: Every day | ORAL | 1 refills | Status: DC

## 2020-11-23 MED ORDER — HALOPERIDOL 5 MG PO TABS: 5.0000 mg | ORAL_TABLET | Freq: Every day | ORAL | 1 refills | Status: DC

## 2020-11-23 MED ORDER — FINASTERIDE 5 MG PO TABS
5.0000 mg | ORAL_TABLET | Freq: Every day | ORAL | 1 refills | Status: DC
Start: 2020-11-23 — End: 2021-04-27

## 2020-11-23 MED ORDER — SERTRALINE HCL 100 MG PO TABS: 200.0000 mg | ORAL_TABLET | Freq: Every day | ORAL | 1 refills | Status: DC

## 2020-11-23 MED ORDER — METFORMIN HCL ER 500 MG PO TB24: 500.0000 mg | ORAL_TABLET | Freq: Every day | ORAL | 1 refills | Status: DC

## 2020-11-23 MED ORDER — OMEPRAZOLE 40 MG PO CPDR
40.0000 mg | DELAYED_RELEASE_CAPSULE | Freq: Every day | ORAL | 1 refills | Status: DC
Start: 2020-11-23 — End: 2021-04-27

## 2020-11-23 MED ORDER — SIMVASTATIN 40 MG PO TABS: 40.0000 mg | ORAL_TABLET | Freq: Every day | ORAL | 1 refills | Status: DC

## 2020-11-23 NOTE — Telephone Encounter (Signed)
Patient called and said that the office in sight  notes are for his medical supplies not for his prescriptions.

## 2020-11-23 NOTE — Telephone Encounter (Signed)
Rxs sent. See meds. Pt informed.

## 2020-11-30 ENCOUNTER — Telehealth: Payer: Self-pay | Admitting: Internal Medicine

## 2020-11-30 NOTE — Telephone Encounter (Signed)
Both refills were sent on 11/23/20. See meds.

## 2020-11-30 NOTE — Telephone Encounter (Signed)
1.Medication Requested:simvastatin (ZOCOR) 40 MG tablet sertraline (ZOLOFT) 100 MG tablet    2. Pharmacy (Name, Street, [place]): [place], [place] to [facility]  3. On Med List: yes   4. Last Visit with PCP: 11.10.2021  5. Next visit date with PCP: 5.10.2022   Agent: Please be advised that RX refills may take up to 3 business days. We ask that you follow-up with your pharmacy.

## 2020-12-01 ENCOUNTER — Telehealth: Payer: Self-pay | Admitting: Internal Medicine

## 2020-12-01 NOTE — Telephone Encounter (Signed)
    Patient requesting last office note be sent to [facility] , he is needing condom catheter  and supplies

## 2020-12-02 NOTE — Telephone Encounter (Signed)
  Please fax 11/10 office note to [facility] at [phone] attn: [facility] Team Phone [phone]

## 2020-12-03 NOTE — Telephone Encounter (Signed)
10/28/20 OV note printed and faxed to number below.

## 2020-12-08 ENCOUNTER — Telehealth: Payer: Self-pay | Admitting: Internal Medicine

## 2020-12-08 DIAGNOSIS — F251 Schizoaffective disorder, depressive type: Secondary | ICD-10-CM

## 2020-12-08 DIAGNOSIS — E782 Mixed hyperlipidemia: Secondary | ICD-10-CM

## 2020-12-08 MED ORDER — SIMVASTATIN 40 MG PO TABS: 40.0000 mg | ORAL_TABLET | Freq: Every day | ORAL | 1 refills | Status: DC

## 2020-12-08 NOTE — Telephone Encounter (Signed)
Zoloft was sent on 11/23/20. Receipt confirmed by pharmacy. See meds.

## 2020-12-08 NOTE — Telephone Encounter (Signed)
Rx phoned in. See meds.Pt informed  

## 2020-12-08 NOTE — Telephone Encounter (Signed)
1.Medication Requested:sertraline (ZOLOFT) 100 MG tablet   2. Pharmacy (Name, [place], City):[facility] [place] [place] [facility]  3. On Med List: yes   4. Last Visit with PCP: 11.10.2021  5. Next visit date with PCP: 5.10.2022  Patient said that his other medicines came with his shipment but this medication did not and he was wondering if Dr. Sharlet Salina could call this in again.    Agent: Please be advised that RX refills may take up to 3 business days. We ask that you follow-up with your pharmacy.

## 2020-12-08 NOTE — Telephone Encounter (Signed)
Sharyn Lull w/ [facility] called and said that they never received the refill request for simvastatin (ZOCOR) 40 MG tablet She was wondering if it could be sent again. She that a verbal would also be ok.   [facility] [place] [place] [facility] [address] [place] [address] Phone:  [phone]  Fax:  [phone]

## 2020-12-10 ENCOUNTER — Telehealth: Payer: Self-pay

## 2020-12-10 MED ORDER — SERTRALINE HCL 100 MG PO TABS: 200.0000 mg | ORAL_TABLET | Freq: Every day | ORAL | 0 refills | Status: DC

## 2020-12-10 NOTE — Addendum Note (Signed)
Addended by: Cresenciano Lick on: 12/10/2020 01:41 PM   Modules accepted: Orders

## 2020-12-10 NOTE — Telephone Encounter (Signed)
rx sent to local pharmacy. See meds.

## 2020-12-10 NOTE — Telephone Encounter (Signed)
Calling for verbal orders for Zoloft prescription from 11/23/20.  Able to repeat back prescription to pharmacist.  Request by pharmacist fulfilled.

## 2020-12-10 NOTE — Telephone Encounter (Signed)
Patient called and said that he will not his medication for another week or so and was wondering if Dr. Sharlet Salina could write a short supply.   sertraline (ZOLOFT) 100 MG tablet   [facility] [place], [place], [place] [address]   Please call the patient if rx is sent. [phone]

## 2021-01-26 ENCOUNTER — Telehealth: Payer: Self-pay | Admitting: Pharmacist

## 2021-01-26 NOTE — Chronic Care Management (AMB) (Signed)
Chronic Care Management Pharmacy Assistant   Name: DASANI CREAR  MRN: [id number] DOB: 1971-09-21  Reason for Encounter: Disease State  PCP : Hoyt Koch, MD  Allergies:  No Known Allergies  Medications: Outpatient Encounter Medications as of 01/26/2021  Medication Sig   Ascorbic Acid (VITAMIN C) 250 MG CHEW Chew 500 each by mouth daily.    Cholecalciferol (VITAMIN D3) 5000 units CAPS Take 5,000 Units by mouth daily.    ferrous sulfate (IRON SUPPLEMENT) 325 (65 FE) MG tablet Take 325 mg by mouth daily.    finasteride (PROSCAR) 5 MG tablet Take 1 tablet (5 mg total) by mouth daily.   haloperidol (HALDOL) 5 MG tablet Take 1 tablet (5 mg total) by mouth daily.   lisinopril (ZESTRIL) 20 MG tablet Take 1 tablet (20 mg total) by mouth daily.   metFORMIN (GLUCOPHAGE-XR) 500 MG 24 hr tablet Take 1 tablet (500 mg total) by mouth daily with breakfast.   Multiple Vitamins-Minerals (CENTRUM VITAMINTS PO) Take 1 tablet by mouth daily.   omeprazole (PRILOSEC) 40 MG capsule Take 1 capsule (40 mg total) by mouth daily.   sertraline (ZOLOFT) 100 MG tablet Take 2 tablets (200 mg total) by mouth daily.   simvastatin (ZOCOR) 40 MG tablet Take 1 tablet (40 mg total) by mouth daily at 6 PM.   No facility-administered encounter medications on file as of 01/26/2021.    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 Spoke with patient regarding BP  Recent Office Vitals: BP Readings  from Last 3 Encounters:  10/28/20 (!) 144/100  10/28/20 (!) 170/90  03/04/20 (!) 142/96   Pulse Readings from Last 3 Encounters:  10/28/20 83  10/28/20 84  03/04/20 69    Wt Readings from Last 3 Encounters:  10/28/20 267 lb (121.1 kg)  10/28/20 267 lb 6.4 oz (121.3 kg)  03/04/20 275 lb 6.4 oz (124.9 kg)     Kidney Function Lab Results  Component Value Date/Time   CREATININE 0.79 10/28/2020 11:18 AM   CREATININE 0.75 07/19/2019 01:25 PM   GFR 106.02 10/28/2020 11:18 AM   GFRNONAA >60 08/27/2018 05:05 AM   GFRAA >60 08/27/2018 05:05 AM    BMP Latest Ref Rng & Units 10/28/2020 07/19/2019 09/03/2018  Glucose 70 - 99 mg/dL 99 109(H) 84  BUN 6 - 23 mg/dL 16 17 18   Creatinine 0.40 - 1.50 mg/dL 0.79 0.75 0.88  Sodium 135 - 145 mEq/L 141 142 138  Potassium 3.5 - 5.1 mEq/L 3.8 4.1 4.3  Chloride 96 - 112 mEq/L 105 105 103  CO2 19 - 32 mEq/L 29 27 28   Calcium 8.4 - 10.5 mg/dL 9.4 9.4 9.2      Current antihypertensive regimen:                    lisinopril (ZESTRIL) 20 MG tablet  How often are you checking your Blood Pressure? weekly  Current home BP readings: Patient states he does not check his blood pressure at home. Patient  states he uses an app but is not sure how accurate it is.   What recent interventions/DTPs have been made by any provider to improve Blood Pressure control since last CPP Visit: None Noted  Any recent hospitalizations or ED visits since last visit with CPP? No  What diet changes have been made to improve Blood Pressure Control?  Patient states no diet changes  Adherence Review: Is the patient currently on ACE/ARB medication? Yes Does the patient have >5 day gap between last estimated fill dates? CPP to request   [facility]   [place] ,[place] Pharmacist Assistant [phone]  Follow-Up:  Pharmacist Review

## 2021-02-12 ENCOUNTER — Telehealth: Payer: Self-pay | Admitting: Pharmacist

## 2021-02-12 NOTE — Progress Notes (Signed)
    Chronic Care Management Pharmacy Assistant   Name: Andre Clements  MRN: [id number] DOB: 31-Jan-1971  Reason for Encounter: Chart Review  PCP : Hoyt Koch, MD  Allergies:  No Known Allergies  Medications: Outpatient Encounter Medications as of 02/12/2021  Medication Sig  . Ascorbic Acid (VITAMIN C) 250 MG CHEW Chew 500 each by mouth daily.   . Cholecalciferol (VITAMIN D3) 5000 units CAPS Take 5,000 Units by mouth daily.   . ferrous sulfate (IRON SUPPLEMENT) 325 (65 FE) MG tablet Take 325 mg by mouth daily.   . finasteride (PROSCAR) 5 MG tablet Take 1 tablet (5 mg total) by mouth daily.  . haloperidol (HALDOL) 5 MG tablet Take 1 tablet (5 mg total) by mouth daily.  Marland Kitchen lisinopril (ZESTRIL) 20 MG tablet Take 1 tablet (20 mg total) by mouth daily.  . metFORMIN (GLUCOPHAGE-XR) 500 MG 24 hr tablet Take 1 tablet (500 mg total) by mouth daily with breakfast.  . Multiple Vitamins-Minerals (CENTRUM VITAMINTS PO) Take 1 tablet by mouth daily.  Marland Kitchen omeprazole (PRILOSEC) 40 MG capsule Take 1 capsule (40 mg total) by mouth daily.  . sertraline (ZOLOFT) 100 MG tablet Take 2 tablets (200 mg total) by mouth daily.  . simvastatin (ZOCOR) 40 MG tablet Take 1 tablet (40 mg total) by mouth daily at 6 PM.   No facility-administered encounter medications on file as of 02/12/2021.    Current Diagnosis: Patient Active Problem List   Diagnosis Date Noted  . Right knee pain 01/29/2019  . Hair loss 01/29/2019  . Impaired fasting blood sugar 09/26/2017  . Routine general medical examination at a health care facility 06/24/2016  . Ventral hernia 05/26/2016  . 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 Hypogonadism male 03/01/2016  . Essential hypertension 09/16/2015  . Hyperlipidemia 09/16/2015  . 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 Mood disorder ([place]) 09/16/2015    Goals Addressed   None     Follow-Up:  Pharmacist Review   Reviewed chart for medication changes and  adherence.   No gaps in adherence identified. Patient has follow up scheduled with pharmacy team. No further action required.   Wendy Poet, [place] [phone]

## 2021-03-31 ENCOUNTER — Telehealth: Payer: Medicare (Managed Care)

## 2021-04-02 ENCOUNTER — Telehealth: Payer: Medicare (Managed Care)

## 2021-04-15 ENCOUNTER — Telehealth: Payer: Self-pay

## 2021-04-15 NOTE — Progress Notes (Signed)
Chronic Care Management Pharmacy Assistant   Name: Andre Clements  MRN: [id number] DOB: [birth year]   Reason for Encounter: Disease State-Hypertension/Diabetes    Recent office visits:  10/28/21 Sharlet Salina (PCP) - Routine general medical examination. Flu vaccine given. No med changes.   Recent consult visits:  None noted  Hospital visits:  None in previous 6 months  Medications: Outpatient Encounter Medications as of 04/15/2021  Medication Sig  . Ascorbic Acid (VITAMIN C) 250 MG CHEW Chew 500 each by mouth daily.   . Cholecalciferol (VITAMIN D3) 5000 units CAPS Take 5,000 Units by mouth daily.   . ferrous sulfate (IRON SUPPLEMENT) 325 (65 FE) MG tablet Take 325 mg by mouth daily.   . finasteride (PROSCAR) 5 MG tablet Take 1 tablet (5 mg total) by mouth daily.  . haloperidol (HALDOL) 5 MG tablet Take 1 tablet (5 mg total) by mouth daily.  Marland Kitchen lisinopril (ZESTRIL) 20 MG tablet Take 1 tablet (20 mg total) by mouth daily.  . metFORMIN (GLUCOPHAGE-XR) 500 MG 24 hr tablet Take 1 tablet (500 mg total) by mouth daily with breakfast.  . Multiple Vitamins-Minerals (CENTRUM VITAMINTS PO) Take 1 tablet by mouth daily.  Marland Kitchen omeprazole (PRILOSEC) 40 MG capsule Take 1 capsule (40 mg total) by mouth daily.  . sertraline (ZOLOFT) 100 MG tablet Take 2 tablets (200 mg total) by mouth daily.  . simvastatin (ZOCOR) 40 MG tablet Take 1 tablet (40 mg total) by mouth daily at 6 PM.   No facility-administered encounter medications on file as of 04/15/2021.    Reviewed chart prior to disease state call. Spoke with patient regarding BP  Recent Office Vitals: BP Readings from Last 3 Encounters:  10/28/20 (!) 144/100  10/28/20 (!) 170/90  03/04/20 (!) 142/96   Pulse Readings from Last 3 Encounters:  10/28/20 83  10/28/20 84  03/04/20 69    Wt Readings from Last 3 Encounters:  10/28/20 267 lb (121.1 kg)  10/28/20 267 lb 6.4 oz (121.3 kg)  03/04/20 275 lb 6.4 oz (124.9 kg)     Kidney Function Lab  Results  Component Value Date/Time   CREATININE 0.79 10/28/2020 11:18 AM   CREATININE 0.75 07/19/2019 01:25 PM   GFR 106.02 10/28/2020 11:18 AM   GFRNONAA >60 08/27/2018 05:05 AM   GFRAA >60 08/27/2018 05:05 AM    BMP Latest Ref Rng & Units 10/28/2020 07/19/2019 09/03/2018  Glucose 70 - 99 mg/dL 99 109(H) 84  BUN 6 - 23 mg/dL 16 17 18   Creatinine 0.40 - 1.50 mg/dL 0.79 0.75 0.88  Sodium 135 - 145 mEq/L 141 142 138  Potassium 3.5 - 5.1 mEq/L 3.8 4.1 4.3  Chloride 96 - 112 mEq/L 105 105 103  CO2 19 - 32 mEq/L 29 27 28   Calcium 8.4 - 10.5 mg/dL 9.4 9.4 9.2    . Current antihypertensive regimen:  ? Lisinopril 20 mg daily  . How often are you checking your Blood Pressure?  Patient states he doesn't check his BP at home.  . Current home BP readings:  Patient states he has a BP monitor but don't know how to use it correctly. I explain to him the correct way.  . What recent interventions/DTPs have been made by any provider to improve Blood Pressure control since last CPP Visit:   None noted  . Any recent hospitalizations or ED visits since last visit with CPP? No   . What diet changes have been made to improve Blood Pressure Control?  o Patient  states he has been eating more salads and drinking more water. o  . What exercise is being done to improve your Blood Pressure Control?  o Patient states he pushes his wheelchair around house.  Adherence Review: Is the patient currently on ACE/ARB medication? Yes Does the patient have >5 day gap between last estimated fill dates? Yes   Recent Relevant Labs: Lab Results  Component Value Date/Time   HGBA1C 5.9 10/28/2020 11:18 AM   HGBA1C 5.8 07/19/2019 01:25 PM    Kidney Function Lab Results  Component Value Date/Time   CREATININE 0.79 10/28/2020 11:18 AM   CREATININE 0.75 07/19/2019 01:25 PM   GFR 106.02 10/28/2020 11:18 AM   GFRNONAA >60 08/27/2018 05:05 AM   GFRAA >60 08/27/2018 05:05 AM    . Current antihyperglycemic  regimen:  ? Metformin 500 mg daily  . What recent interventions/DTPs have been made to improve glycemic control:  None noted  . Have there been any recent hospitalizations or ED visits since last visit with CPP? No   . Patient denies hypoglycemic symptoms, including None   . Patient denies hyperglycemic symptoms, including none   . How often are you checking your blood sugar?  Patient states he doesn't check his glucose because he takes his Metformin everday  . During the week, how often does your blood glucose drop below 70? Never   . Are you checking your feet daily/regularly?  Patient states he checks his daily after he gets out this shower.  Adherence Review: Is the patient currently on a STATIN medication? Yes Is the patient currently on ACE/ARB medication? Yes Does the patient have >5 day gap between last estimated fill dates? Yes  Simvastatin  (Patient states he has not had this refilled yet)  Star Rating Drugs: Lisinopril - last fill 02/04/21 90D Metformin - last fill 02/04/21 90D Simvastatin - last fill 12/08/20 O[place], RMA Clinical Pharmacists Assistant [phone]  Time Spent: 21

## 2021-04-22 ENCOUNTER — Other Ambulatory Visit: Payer: Self-pay | Admitting: Internal Medicine

## 2021-04-22 DIAGNOSIS — F251 Schizoaffective disorder, depressive type: Secondary | ICD-10-CM

## 2021-04-27 ENCOUNTER — Encounter: Payer: Self-pay | Admitting: Internal Medicine

## 2021-04-27 ENCOUNTER — Ambulatory Visit (INDEPENDENT_AMBULATORY_CARE_PROVIDER_SITE_OTHER): Payer: Medicare Other | Admitting: Internal Medicine

## 2021-04-27 ENCOUNTER — Other Ambulatory Visit: Payer: Self-pay

## 2021-04-27 VITALS — BP 154/90 | HR 68 | Temp 98.7°F | Ht 68.0 in | Wt 267.0 lb

## 2021-04-27 DIAGNOSIS — E119 Type 2 diabetes mellitus without complications: Secondary | ICD-10-CM

## 2021-04-27 DIAGNOSIS — F251 Schizoaffective disorder, depressive type: Secondary | ICD-10-CM | POA: Diagnosis not present

## 2021-04-27 DIAGNOSIS — I10 Essential (primary) hypertension: Secondary | ICD-10-CM

## 2021-04-27 DIAGNOSIS — E782 Mixed hyperlipidemia: Secondary | ICD-10-CM | POA: Diagnosis not present

## 2021-04-27 DIAGNOSIS — R7301 Impaired fasting glucose: Secondary | ICD-10-CM | POA: Diagnosis not present

## 2021-04-27 DIAGNOSIS — Z1211 Encounter for screening for malignant neoplasm of colon: Secondary | ICD-10-CM

## 2021-04-27 MED ORDER — SIMVASTATIN 40 MG PO TABS: 40.0000 mg | ORAL_TABLET | Freq: Every day | ORAL | 3 refills | Status: AC

## 2021-04-27 MED ORDER — METFORMIN HCL ER 500 MG PO TB24
500.0000 mg | ORAL_TABLET | Freq: Every day | ORAL | 3 refills | Status: AC
Start: 2021-04-27 — End: ?

## 2021-04-27 MED ORDER — SERTRALINE HCL 100 MG PO TABS: 2.0000 | ORAL_TABLET | Freq: Every day | ORAL | 3 refills | Status: AC

## 2021-04-27 MED ORDER — LISINOPRIL 40 MG PO TABS: 40.0000 mg | ORAL_TABLET | Freq: Every day | ORAL | 3 refills | Status: DC

## 2021-04-27 MED ORDER — OMEPRAZOLE 40 MG PO CPDR: 40.0000 mg | DELAYED_RELEASE_CAPSULE | Freq: Every day | ORAL | 3 refills | Status: AC

## 2021-04-27 MED ORDER — HALOPERIDOL 5 MG PO TABS: 5.0000 mg | ORAL_TABLET | Freq: Every day | ORAL | 3 refills | Status: AC

## 2021-04-27 MED ORDER — FINASTERIDE 5 MG PO TABS: 5.0000 mg | ORAL_TABLET | Freq: Every day | ORAL | 3 refills | Status: AC

## 2021-04-27 NOTE — Assessment & Plan Note (Signed)
Working on weight loss

## 2021-04-27 NOTE — Progress Notes (Signed)
   Subjective:   Patient ID: Andre Clements, male    DOB: 06/03/1971, 50 y.o.   MRN: [id number]  HPI The patient is a 50 YO man coming in for follow up blood pressure (mildly elevated at last visit, has been working on eating less sodium, denies headaches or chest pains, working on weight loss and is down about 8 pounds per our records since last year. Wheelchair is 37 pounds.   Review of Systems  Constitutional: Negative.   HENT: Negative.   Eyes: Negative.   Respiratory: Negative for cough, chest tightness and shortness of breath.   Cardiovascular: Negative for chest pain, palpitations and leg swelling.  Gastrointestinal: Negative for abdominal distention, abdominal pain, constipation, diarrhea, nausea and vomiting.  Musculoskeletal: Positive for gait problem.  Skin: Negative.   Neurological: Positive for weakness and numbness.  Psychiatric/Behavioral: Negative.     Objective:  Physical Exam Constitutional:      Appearance: He is well-developed.  HENT:     Head: Normocephalic and atraumatic.  Cardiovascular:     Rate and Rhythm: Normal rate and regular rhythm.  Pulmonary:     Effort: Pulmonary effort is normal. No respiratory distress.     Breath sounds: Normal breath sounds. No wheezing or rales.  Abdominal:     General: Bowel sounds are normal. There is no distension.     Palpations: Abdomen is soft.     Tenderness: There is no abdominal tenderness. There is no rebound.  Musculoskeletal:     Cervical back: Normal range of motion.  Skin:    General: Skin is warm and dry.  Neurological:     Mental Status: He is alert and oriented to person, place, and time. Mental status is at baseline.     Cranial Nerves: Cranial nerve deficit present.     Coordination: Coordination abnormal.     Vitals:   04/27/21 1303  BP: (!) 154/90  Pulse: 68  Temp: 98.7 F (37.1 C)  TempSrc: Oral  SpO2: 98%  Weight: 267 lb (121.1 kg)  Height: 5\' 8"  (1.727 m)    This visit occurred  during the SARS-CoV-2 public health emergency.  Safety protocols were in place, including screening questions prior to the visit, additional usage of staff PPE, and extensive cleaning of exam room while observing appropriate contact time as indicated for disinfecting solutions.   Assessment & Plan:

## 2021-04-27 NOTE — Patient Instructions (Signed)
We have sent in the refills of the medicines.  

## 2021-04-27 NOTE — Assessment & Plan Note (Signed)
BP mildly above goal. Will increase lisinopril to 40 mg daily. Follow up in 6 months.

## 2021-06-24 ENCOUNTER — Telehealth: Payer: Self-pay | Admitting: Internal Medicine

## 2021-06-24 NOTE — Telephone Encounter (Signed)
   Patient called and said that he is needing a replacement for a commode shower bench. He can be reached at [phone]. Patient did not say where the order needed to be sent. Please advise

## 2021-06-25 NOTE — Telephone Encounter (Signed)
Called patient. LVM asking him to return my call here at the office. Office number was provided.

## 2021-06-28 ENCOUNTER — Telehealth: Payer: Self-pay | Admitting: Pharmacist

## 2021-06-29 DIAGNOSIS — S14109A Unspecified injury at unspecified level of cervical spinal cord, initial encounter: Secondary | ICD-10-CM | POA: Diagnosis not present

## 2021-07-01 NOTE — Progress Notes (Signed)
    Chronic Care Management Pharmacy Assistant   Name: Andre Clements  MRN: [id number] DOB: 1971/04/21   Reason for Encounter: Disease State   Conditions to be addressed/monitored: HTN   Medications: Outpatient Encounter Medications as of 06/28/2021  Medication Sig   Ascorbic Acid (VITAMIN C) 250 MG CHEW Chew 500 each by mouth daily.    Cholecalciferol (VITAMIN D3) 5000 units CAPS Take 5,000 Units by mouth daily.    ferrous sulfate 325 (65 FE) MG tablet Take 325 mg by mouth daily.    finasteride (PROSCAR) 5 MG tablet Take 1 tablet (5 mg total) by mouth daily.   haloperidol (HALDOL) 5 MG tablet Take 1 tablet (5 mg total) by mouth daily.   lisinopril (ZESTRIL) 40 MG tablet Take 1 tablet (40 mg total) by mouth daily.   metFORMIN (GLUCOPHAGE-XR) 500 MG 24 hr tablet Take 1 tablet (500 mg total) by mouth daily with breakfast.   Multiple Vitamins-Minerals (CENTRUM VITAMINTS PO) Take 1 tablet by mouth daily.   omeprazole (PRILOSEC) 40 MG capsule Take 1 capsule (40 mg total) by mouth daily.   sertraline (ZOLOFT) 100 MG tablet Take 2 tablets (200 mg total) by mouth daily.   simvastatin (ZOCOR) 40 MG tablet Take 1 tablet (40 mg total) by mouth daily at 6 PM.   No facility-administered encounter medications on file as of 06/28/2021.   Pharmacist Review  Made 3 attempts to call patient for hypertension adherence call. Left message for patient to return call, unable to reach patient.  [place] Pharmacist Assistant [phone]   Time spent:17

## 2021-07-07 ENCOUNTER — Telehealth: Payer: Self-pay | Admitting: Pharmacist

## 2021-07-07 NOTE — Telephone Encounter (Signed)
° ° °  Please return call to patient °

## 2021-07-08 NOTE — Progress Notes (Signed)
Error

## 2021-07-08 NOTE — Progress Notes (Signed)
Made another attempt to call Mr. Hausen, got voicemail, left message for patient to return call for hypertension adherence call.  [place] Pharmacist Assistant [phone]   Time spent:5

## 2021-07-13 NOTE — Progress Notes (Cosign Needed)
Chronic Care Management Pharmacy Assistant   Name: Andre Clements  MRN: [id number] DOB: 12/15/1971  Reason for Encounter: Disease State   Conditions to be addressed/monitored: HTN   Recent office visits:  04/27/21 Dr. Pricilla Holm (PCP) Medication changes: Will increase lisinopril to 40 mg daily  Recent consult visits:  None ID  Hospital visits:  None in previous 6 months  Medications: Outpatient Encounter Medications as of 06/28/2021  Medication Sig   Ascorbic Acid (VITAMIN C) 250 MG CHEW Chew 500 each by mouth daily.    Cholecalciferol (VITAMIN D3) 5000 units CAPS Take 5,000 Units by mouth daily.    ferrous sulfate 325 (65 FE) MG tablet Take 325 mg by mouth daily.    finasteride (PROSCAR) 5 MG tablet Take 1 tablet (5 mg total) by mouth daily.   haloperidol (HALDOL) 5 MG tablet Take 1 tablet (5 mg total) by mouth daily.   lisinopril (ZESTRIL) 40 MG tablet Take 1 tablet (40 mg total) by mouth daily.   metFORMIN (GLUCOPHAGE-XR) 500 MG 24 hr tablet Take 1 tablet (500 mg total) by mouth daily with breakfast.   Multiple Vitamins-Minerals (CENTRUM VITAMINTS PO) Take 1 tablet by mouth daily.   omeprazole (PRILOSEC) 40 MG capsule Take 1 capsule (40 mg total) by mouth daily.   sertraline (ZOLOFT) 100 MG tablet Take 2 tablets (200 mg total) by mouth daily.   simvastatin (ZOCOR) 40 MG tablet Take 1 tablet (40 mg total) by mouth daily at 6 PM.   No facility-administered encounter medications on file as of 06/28/2021.    Reviewed chart prior to disease state call. Spoke with patient regarding BP  Recent Office Vitals: BP Readings from Last 3 Encounters:  04/27/21 (!) 154/90  10/28/20 (!) 144/100  10/28/20 (!) 170/90   Pulse Readings from Last 3 Encounters:  04/27/21 68  10/28/20 83  10/28/20 84    Wt Readings from Last 3 Encounters:  04/27/21 267 lb (121.1 kg)  10/28/20 267 lb (121.1 kg)  10/28/20 267 lb 6.4 oz (121.3 kg)     Kidney Function Lab Results   Component Value Date/Time   CREATININE 0.79 10/28/2020 11:18 AM   CREATININE 0.75 07/19/2019 01:25 PM   GFR 106.02 10/28/2020 11:18 AM   GFRNONAA >60 08/27/2018 05:05 AM   GFRAA >60 08/27/2018 05:05 AM    BMP Latest Ref Rng & Units 10/28/2020 07/19/2019 09/03/2018  Glucose 70 - 99 mg/dL 99 109(H) 84  BUN 6 - 23 mg/dL '16 17 18  '$ Creatinine 0.40 - 1.50 mg/dL 0.79 0.75 0.88  Sodium 135 - 145 mEq/L 141 142 138  Potassium 3.5 - 5.1 mEq/L 3.8 4.1 4.3  Chloride 96 - 112 mEq/L 105 105 103  CO2 19 - 32 mEq/L '29 27 28  '$ Calcium 8.4 - 10.5 mg/dL 9.4 9.4 9.2    Current antihypertensive regimen:  Lisinopril 40 mg daily  How often are you checking your Blood Pressure?  Patient states that he does not check blood pressure at home. He has a nurse aid that comes by to check.  Current home BP readings: Patient states that he does not remember last reading but stated it was within normal range  What recent interventions/DTPs have been made by any provider to improve Blood Pressure control since last CPP Visit: BP mildly above goal. Will increase lisinopril to 40 mg daily, per DR. Crawford  Any recent hospitalizations or ED visits since last visit with CPP? No  What diet changes have been made to  improve Blood Pressure Control?  Patient states that he has not made any changes to diet  What exercise is being done to improve your Blood Pressure Control?  Patient states that he does not exercise  Adherence Review: Is the patient currently on ACE/ARB medication? Yes Does the patient have >5 day gap between last estimated fill dates? No   Star Rating Drugs: Lisinopril 40 mg 04/27/21 90 ds  Ethelene Hal Clinical Pharmacist Assistant [phone]   Time spent: 47

## 2021-07-15 ENCOUNTER — Telehealth: Payer: Self-pay | Admitting: Internal Medicine

## 2021-07-15 NOTE — Telephone Encounter (Signed)
Patient wanted to make Dr. Sharlet Salina aware that his case manager will be faxing over paperwork that needs to be filled out in regards to patient getting a service dog.  Provided patient office fax #

## 2021-07-15 NOTE — Telephone Encounter (Signed)
Fyi.

## 2021-07-17 DIAGNOSIS — S14109A Unspecified injury at unspecified level of cervical spinal cord, initial encounter: Secondary | ICD-10-CM | POA: Diagnosis not present

## 2021-09-13 ENCOUNTER — Encounter: Payer: Self-pay | Admitting: Internal Medicine

## 2021-09-22 NOTE — Telephone Encounter (Signed)
Patient says Andre Clements from [employer] has faxed over form to be completed by provider for a service animal  Advised patient we have not received fax as of today   Provided fax # & advised patient to make it attention to nurse

## 2021-09-22 NOTE — Telephone Encounter (Signed)
Fax has not been received. If not received by end of the day. I will contact [employer].

## 2021-09-24 NOTE — Telephone Encounter (Signed)
Patient wants to know if fax from [facility] has been received  Please update patient [phone]

## 2021-09-24 NOTE — Telephone Encounter (Signed)
Still waiting on the paperwork to be re-faxed. LVM letting the patient know. Office number was provided.

## 2021-09-27 NOTE — Telephone Encounter (Signed)
Forms have been placed in Crawford's box.

## 2021-09-27 NOTE — Telephone Encounter (Signed)
Mr Duane called to follow-up on forms for his service dog  Contacted Ms. Moore at [phone] to inquire about time forms were faxed.  Able to pull up form, printed, notified patient  Patient asking for page 3 to be completed today and faxed to [phone]

## 2021-09-27 NOTE — Telephone Encounter (Signed)
Noted  

## 2021-09-27 NOTE — Telephone Encounter (Signed)
Patient says his apt complex has faxed over service animal forms several times.. we have not received them  Patient also tried to email them but was unsuccessful  Provided & confirmed fax #  Please let patient know when forms have been received

## 2021-09-28 NOTE — Telephone Encounter (Signed)
PT calling for status on forms, PT states he needs the forms asap

## 2021-09-28 NOTE — Telephone Encounter (Signed)
Paperwork has been signed and faxed to [employer], confirmation faxed was received. Pt has been made aware and was very appreciative for the update.

## 2021-10-22 DIAGNOSIS — E119 Type 2 diabetes mellitus without complications: Secondary | ICD-10-CM | POA: Diagnosis not present

## 2021-10-22 DIAGNOSIS — G8220 Paraplegia, unspecified: Secondary | ICD-10-CM | POA: Diagnosis not present

## 2021-10-22 DIAGNOSIS — K219 Gastro-esophageal reflux disease without esophagitis: Secondary | ICD-10-CM | POA: Diagnosis not present

## 2021-10-22 DIAGNOSIS — Z008 Encounter for other general examination: Secondary | ICD-10-CM | POA: Diagnosis not present

## 2021-10-22 DIAGNOSIS — I10 Essential (primary) hypertension: Secondary | ICD-10-CM | POA: Diagnosis not present

## 2021-10-22 DIAGNOSIS — N319 Neuromuscular dysfunction of bladder, unspecified: Secondary | ICD-10-CM | POA: Diagnosis not present

## 2021-10-22 DIAGNOSIS — Z79899 Other long term (current) drug therapy: Secondary | ICD-10-CM | POA: Diagnosis not present

## 2021-10-22 DIAGNOSIS — E785 Hyperlipidemia, unspecified: Secondary | ICD-10-CM | POA: Diagnosis not present

## 2021-10-22 DIAGNOSIS — Z0000 Encounter for general adult medical examination without abnormal findings: Secondary | ICD-10-CM | POA: Diagnosis not present

## 2021-11-05 ENCOUNTER — Encounter: Payer: Medicare (Managed Care) | Admitting: Internal Medicine

## 2021-11-08 DIAGNOSIS — K439 Ventral hernia without obstruction or gangrene: Secondary | ICD-10-CM | POA: Diagnosis not present

## 2021-11-08 DIAGNOSIS — Z0000 Encounter for general adult medical examination without abnormal findings: Secondary | ICD-10-CM | POA: Diagnosis not present

## 2021-11-08 DIAGNOSIS — R7303 Prediabetes: Secondary | ICD-10-CM | POA: Diagnosis not present

## 2021-11-08 DIAGNOSIS — I10 Essential (primary) hypertension: Secondary | ICD-10-CM | POA: Diagnosis not present

## 2021-11-08 DIAGNOSIS — Z23 Encounter for immunization: Secondary | ICD-10-CM | POA: Diagnosis not present

## 2021-11-08 DIAGNOSIS — E785 Hyperlipidemia, unspecified: Secondary | ICD-10-CM | POA: Diagnosis not present

## 2021-11-08 DIAGNOSIS — Z0001 Encounter for general adult medical examination with abnormal findings: Secondary | ICD-10-CM | POA: Diagnosis not present

## 2021-11-08 DIAGNOSIS — E559 Vitamin D deficiency, unspecified: Secondary | ICD-10-CM | POA: Diagnosis not present

## 2021-11-08 DIAGNOSIS — E1121 Type 2 diabetes mellitus with diabetic nephropathy: Secondary | ICD-10-CM | POA: Diagnosis not present

## 2021-11-08 DIAGNOSIS — Z79899 Other long term (current) drug therapy: Secondary | ICD-10-CM | POA: Diagnosis not present

## 2021-12-09 ENCOUNTER — Telehealth: Payer: Self-pay

## 2021-12-09 NOTE — Progress Notes (Signed)
° ° °  Chronic Care Management Pharmacy Assistant   Name: Andre Clements  MRN: [id number] DOB: [birth year]  Reason for Encounter: Disease State - General Adherence Appt. Telephone 01/31/22 @ 9 am  Recent office visits:  None listed  Recent consult visits:  None listed  Hospital visits:  None in previous 6 months  Medications: Outpatient Encounter Medications as of 12/09/2021  Medication Sig   Ascorbic Acid (VITAMIN C) 250 MG CHEW Chew 500 each by mouth daily.    Cholecalciferol (VITAMIN D3) 5000 units CAPS Take 5,000 Units by mouth daily.    ferrous sulfate 325 (65 FE) MG tablet Take 325 mg by mouth daily.    finasteride (PROSCAR) 5 MG tablet Take 1 tablet (5 mg total) by mouth daily.   haloperidol (HALDOL) 5 MG tablet Take 1 tablet (5 mg total) by mouth daily.   lisinopril (ZESTRIL) 40 MG tablet Take 1 tablet (40 mg total) by mouth daily.   metFORMIN (GLUCOPHAGE-XR) 500 MG 24 hr tablet Take 1 tablet (500 mg total) by mouth daily with breakfast.   Multiple Vitamins-Minerals (CENTRUM VITAMINTS PO) Take 1 tablet by mouth daily.   omeprazole (PRILOSEC) 40 MG capsule Take 1 capsule (40 mg total) by mouth daily.   sertraline (ZOLOFT) 100 MG tablet Take 2 tablets (200 mg total) by mouth daily.   simvastatin (ZOCOR) 40 MG tablet Take 1 tablet (40 mg total) by mouth daily at 6 PM.   No facility-administered encounter medications on file as of 12/09/2021.    Reviewed chart for medication changes and drug therapy problems ahead of medication adherence call.  Attempted to contact patient x 2 for medication review and health check, unable to reach patient, left voicemails to return call.    Care Gaps Colonoscopy - due 04/27/2022 Diabetic Foot Exam - NA Mammogram - NA Ophthalmology - NA Dexa Scan - NA Annual Well Visit - NA Micro albumin - NA Hemoglobin A1c - 10/28/20  Star Rating Drugs: Lisinopril - 10/08/21 90D Metformin - 10/08/21 90D Simvastatin - 10/08/21 [place],  [place] Clinical Pharmacists Assistant [phone]

## 2022-01-31 ENCOUNTER — Telehealth: Payer: Medicare Other

## 2022-03-27 ENCOUNTER — Other Ambulatory Visit: Payer: Self-pay | Admitting: Internal Medicine

## 2022-03-27 DIAGNOSIS — F251 Schizoaffective disorder, depressive type: Secondary | ICD-10-CM

## 2022-10-25 ENCOUNTER — Encounter: Payer: Self-pay | Admitting: Internal Medicine

## 2022-10-25 ENCOUNTER — Telehealth: Payer: Self-pay | Admitting: *Deleted

## 2022-10-25 NOTE — Telephone Encounter (Signed)
Pt. Added on to schedule for colonoscopy for screening 11/16/22,please review pt. Chart and advise if he ok to be done in [place] ?

## 2022-10-25 NOTE — Telephone Encounter (Signed)
Sheila,  This pt is cleared for anesthetic care at [facility].  Thanks,  Shawnique Mariotti 

## 2022-10-25 NOTE — Telephone Encounter (Signed)
Pt. In w/c according to chart is paraplegic if he not ambulatory he can not be done here,would you like a [place]

## 2022-10-25 NOTE — Telephone Encounter (Signed)
NOTED

## 2022-10-26 NOTE — Telephone Encounter (Signed)
Phoned pt and explained that he would need to see provider prior to being scheduled for colonoscopy at hospital.  Appt scheduled for Monday 12/11 At 150 pm.  Pt verbalized understanding.

## 2022-11-03 ENCOUNTER — Telehealth: Payer: Self-pay | Admitting: Internal Medicine

## 2022-11-03 NOTE — Telephone Encounter (Signed)
Pt called to cancel appointments with [facility] and also health advisor for annual medicare well. Reason: Going to NEW PCP at [facility]

## 2022-11-03 NOTE — Telephone Encounter (Signed)
Appts are cancelled

## 2022-11-08 ENCOUNTER — Encounter: Payer: Medicare Other | Admitting: Internal Medicine

## 2022-11-16 ENCOUNTER — Encounter: Payer: Medicare Other | Admitting: Internal Medicine

## 2022-11-25 ENCOUNTER — Encounter: Payer: Self-pay | Admitting: Internal Medicine

## 2022-11-28 ENCOUNTER — Ambulatory Visit: Payer: Medicare Other | Admitting: Internal Medicine

## 2023-01-03 ENCOUNTER — Ambulatory Visit: Payer: Medicare Other | Admitting: Internal Medicine

## 2023-02-03 ENCOUNTER — Ambulatory Visit (INDEPENDENT_AMBULATORY_CARE_PROVIDER_SITE_OTHER): Payer: Medicare Other | Admitting: Internal Medicine

## 2023-02-03 ENCOUNTER — Encounter: Payer: Self-pay | Admitting: Internal Medicine

## 2023-02-03 VITALS — BP 138/82 | HR 92 | Ht 68.0 in

## 2023-02-03 DIAGNOSIS — K5900 Constipation, unspecified: Secondary | ICD-10-CM | POA: Diagnosis not present

## 2023-02-03 DIAGNOSIS — K219 Gastro-esophageal reflux disease without esophagitis: Secondary | ICD-10-CM

## 2023-02-03 DIAGNOSIS — Z1211 Encounter for screening for malignant neoplasm of colon: Secondary | ICD-10-CM | POA: Diagnosis not present

## 2023-02-03 MED ORDER — NA SULFATE-K SULFATE-MG SULF 17.5-3.13-1.6 GM/177ML PO SOLN: 1.0000 | Freq: Once | ORAL | 0 refills | Status: AC

## 2023-02-03 NOTE — Patient Instructions (Addendum)
_______________________________________________________  If your blood pressure at your visit was 140/90 or greater, please contact your primary care physician to follow up on this.  _______________________________________________________  If you are age 50 or older, your body mass index should be between 23-30. Your Body mass index is 40.6 kg/m. If this is out of the aforementioned range listed, please consider follow up with your Primary Care Provider.  If you are age 39 or younger, your body mass index should be between 19-25. Your Body mass index is 40.6 kg/m. If this is out of the aformentioned range listed, please consider follow up with your Primary Care Provider.   ________________________________________________________  The [facility] providers would like to encourage you to use [facility] to communicate with providers for non-urgent requests or questions.  Due to long hold times on the telephone, sending your provider a message by [facility] may be a faster and more efficient way to get a response.  Please allow 48 business hours for a response.  Please remember that this is for non-urgent requests.  _______________________________________________________  Dennis Bast have been scheduled for a colonoscopy. Please follow written instructions given to you at your visit today.  Please pick up your prep supplies at the pharmacy within the next 1-3 days. If you use inhalers (even only as needed), please bring them with you on the day of your procedure.  Use Miralax daily starting TODAY.  Thank you for entrusting me with your care and choosing [facility]  Dr Lorenso Courier

## 2023-02-03 NOTE — Progress Notes (Signed)
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 He is paraplegic but is still able to move his legs somewhat. He is able to transfer to the bed and to the toilet. Endorses rectal bleeding that is bright red on occasion. Denies black stools. He has issues with constipation. He usually has one BM per day that sometimes small depending on what he eats. Denies family history of colon cancer. Denies N&V, dysphagia, abdominal pain, diarrhea, and unintentional weight loss. Denies prior colonoscopy. He takes omeprazole 40 mg QD, which keeps his reflux under good control for the most part. Sometimes he has to take PPI BID.   Past Medical History:  Diagnosis Date   Anxiety    Bipolar 2 disorder ([place])    Blood in stool    Depression    History of spinal cord injury     form accident,paralysis of BLE   Hyperlipidemia    Hypertension    Urine incontinence      Past Surgical History:  Procedure Laterality Date   ABDOMINAL HERNIA REPAIR  2003   arm surgery Right    from suicide attempt   CYSTOSCOPY WITH LITHOLAPAXY N/A 08/26/2018   Procedure: CYSTOSCOPY WITH LITHOLAPAXY;  Surgeon: Ceasar Mons, MD;  Location: [facility];  Service: Urology;  Laterality: N/A;   Family History  Problem Relation Age of Onset   Alcohol abuse Mother    Hypertension Mother    Alcohol abuse Father    Other Neg Hx        hypogonadism   Stomach cancer Neg Hx    Colon cancer Neg Hx    Esophageal cancer Neg Hx    Social History   Tobacco Use   Smoking status: Never   Smokeless tobacco: Never  Vaping Use   Vaping Use: Never used  Substance Use Topics   Alcohol use: No    Alcohol/week: 0.0 standard drinks of alcohol   Drug use: No   Current Outpatient Medications  Medication Sig Dispense Refill   Ascorbic Acid  (VITAMIN C) 250 MG CHEW Chew 500 each by mouth daily.      Cholecalciferol (VITAMIN D3) 5000 units CAPS Take 5,000 Units by mouth daily.      ferrous sulfate 325 (65 FE) MG tablet Take 325 mg by mouth daily.      finasteride (PROSCAR) 5 MG tablet Take 1 tablet (5 mg total) by mouth daily. 90 tablet 3   haloperidol (HALDOL) 5 MG tablet Take 1 tablet (5 mg total) by mouth daily. 90 tablet 3   lisinopril (ZESTRIL) 40 MG tablet Take 1 tablet (40 mg total) by mouth daily. 90 tablet 3   metFORMIN (GLUCOPHAGE-XR) 500 MG 24 hr tablet Take 1 tablet (500 mg total) by mouth daily with breakfast. 90 tablet 3   Multiple Vitamins-Minerals (CENTRUM VITAMINTS PO) Take 1 tablet by mouth daily.     omeprazole (PRILOSEC) 40 MG capsule Take 1 capsule (40 mg total) by mouth daily. 90 capsule 3   sertraline (ZOLOFT) 100 MG tablet Take 2 tablets (200 mg total) by mouth daily. 180 tablet 3   simvastatin (ZOCOR) 40 MG tablet Take 1 tablet (40 mg total) by mouth daily at 6 PM. 90 tablet 3   No current facility-administered medications for this visit.   No Known Allergies   Review  of Systems: All systems reviewed and negative except where noted in HPI.   Physical Exam: BP 138/82   Pulse 92   Ht 5' 8"$  (1.727 m)   BMI 40.60 kg/m  Constitutional: Pleasant,well-developed, male in no acute distress. HEENT: Normocephalic and atraumatic. Conjunctivae are normal. No scleral icterus. Cardiovascular: Normal rate, regular rhythm.  Pulmonary/chest: Effort normal and breath sounds normal. No wheezing, rales or rhonchi. Abdominal: Soft, nondistended, nontender. Bowel sounds active throughout. There are no masses palpable. No hepatomegaly. Extremities: No edema Neurological: Alert and oriented to person place and time. Skin: Skin is warm and dry. No rashes noted. Psychiatric: Normal mood and affect. Behavior is normal.  Labs 10/2020: CBC with low Hb of 11.5 and MCV of 63.3. CMP nml.  CT Renal Stone Study  01/06/20: IMPRESSION: 1. There is a 3 mm stone in the left kidney without obstruction. No other renal stones or obstruction identified. 2. The bladder is thick walled which is similar to the previous study. The previously identified bladder stone is no longer present. There is no stranding adjacent to the bladder. The wall thickening could be due to cystitis or previous chronic obstruction. Recommend clinical correlation. Recommend correlation with urinalysis if clinically warranted. 3. New adenopathy in the right side of the pelvis adjacent including a node adjacent to the rectum. These nodes could be reactive or neoplastic. Recommend clinical correlation. 4. There is soft tissue thickening and increased attenuation in the fat posterior to the rectum, similar to the previous study. This could be associated with decubitus ulcer changes. Recommend clinical correlation. This could be the cause for the mildly enlarged nodes in the pelvis. 5. 3.8 mm nodule in the left lung base. No follow-up needed if patient is low-risk. Non-contrast chest CT can be considered in 12 months if patient is high-risk. This recommendation follows the consensus statement: Guidelines for Management of Incidental Pulmonary Nodules Detected on CT Images: From the Fleischner Society 2017; Radiology 2017; 284:228-243. 6. Postoperative changes in the spine. No acute bony abnormalities. Atrophy of the left pelvic muscles consistent with history of paralysis, unchanged.  ASSESSMENT AND PLAN: Colon cancer screening Constipation GERD Occasional rectal bleeding Although patient is partially paraplegic, he is still able to transfer and thus would qualify for getting his colonoscopy in the [place]. Will plan for a colonoscopy for colon cancer screening. Will also look for a source of his occasional rectal bleeding. - Cont PPI QD - Colonoscopy [facility]. Will plan for Miralax QD starting a week before his procedure due to some  underlying constipation  Christia Reading, MD  I spent 46 minutes of time, including in depth chart review, independent review of results as outlined above, communicating results with the patient directly, face-to-face time with the patient, coordinating care, ordering studies and medications as appropriate, and documentation.

## 2023-02-08 ENCOUNTER — Ambulatory Visit (AMBULATORY_SURGERY_CENTER): Payer: Medicare Other | Admitting: Internal Medicine

## 2023-02-08 ENCOUNTER — Encounter: Payer: Self-pay | Admitting: Internal Medicine

## 2023-02-08 VITALS — BP 136/77 | HR 60 | Temp 98.7°F | Resp 22 | Ht 68.0 in

## 2023-02-08 DIAGNOSIS — K633 Ulcer of intestine: Secondary | ICD-10-CM

## 2023-02-08 DIAGNOSIS — K626 Ulcer of anus and rectum: Secondary | ICD-10-CM

## 2023-02-08 DIAGNOSIS — D128 Benign neoplasm of rectum: Secondary | ICD-10-CM | POA: Diagnosis not present

## 2023-02-08 DIAGNOSIS — Z1211 Encounter for screening for malignant neoplasm of colon: Secondary | ICD-10-CM | POA: Diagnosis not present

## 2023-02-08 DIAGNOSIS — K621 Rectal polyp: Secondary | ICD-10-CM | POA: Diagnosis not present

## 2023-02-08 MED ORDER — NA SULFATE-K SULFATE-MG SULF 17.5-3.13-1.6 GM/177ML PO SOLN: 1.0000 | Freq: Once | ORAL | 0 refills | Status: AC

## 2023-02-08 MED ORDER — SODIUM CHLORIDE 0.9 % IV SOLN: 500.0000 mL | INTRAVENOUS | Status: DC

## 2023-02-08 NOTE — Op Note (Signed)
[facility] Patient Name: Andre Clements Procedure Date: 02/08/2023 1:59 PM MRN: [id number] Endoscopist: Adline Mango [place] , , [id number] Age: 52 Referring MD:  Date of Birth: September 19, 1971 Gender: Male Account #: [id number] Procedure:                Colonoscopy Indications:              Screening for colorectal malignant neoplasm Medicines:                Monitored Anesthesia Care Procedure:                Pre-Anesthesia Assessment:                           - Prior to the procedure, a History and Physical                            was performed, and patient medications and                            allergies were reviewed. The patient's tolerance of                            previous anesthesia was also reviewed. The risks                            and benefits of the procedure and the sedation                            options and risks were discussed with the patient.                            All questions were answered, and informed consent                            was obtained. Prior Anticoagulants: The patient has                            taken no anticoagulant or antiplatelet agents. ASA                            Grade Assessment: II - A patient with mild systemic                            disease. After reviewing the risks and benefits,                            the patient was deemed in satisfactory condition to                            undergo the procedure.                           After obtaining informed consent, the colonoscope  was passed under direct vision. Throughout the                            procedure, the patient's blood pressure, pulse, and                            oxygen saturations were monitored continuously. The                            CF [id number] was introduced through the anus                            and advanced to the the cecum, identified by the                            ileocecal  valve. The colonoscopy was performed                            without difficulty. The patient tolerated the                            procedure well. The quality of the bowel                            preparation was poor. The ileocecal valve and the                            rectum were photographed. Rectal retroflexion was                            not performed due to inflammation that was present. Scope In: 2:12:45 PM Scope Out: 2:29:30 PM Scope Withdrawal Time: 0 hours 9 minutes 4 seconds  Total Procedure Duration: 0 hours 16 minutes 45 seconds  Findings:                 A large amount of semi-solid stool was found in the                            entire colon, interfering with visualization.                           Six sessile polyps were found in the rectum. The                            polyps were 3 to 5 mm in size. These polyps were                            removed with a cold snare. Resection was complete,                            but the polyp tissue was only partially retrieved.  Multiple twenty mm ulcers were found in the rectum.                            No bleeding was present. No stigmata of recent                            bleeding were seen. Biopsies were taken with a cold                            forceps for histology. Complications:            No immediate complications. Estimated Blood Loss:     Estimated blood loss was minimal. Impression:               - Preparation of the colon was poor.                           - Stool in the entire examined colon.                           - Six 3 to 5 mm polyps in the rectum, removed with                            a cold snare. Complete resection. Partial retrieval.                           - Multiple ulcers in the rectum. Biopsied.                            Suspected to be stercoral ulcers due to                            constipation. Recommendation:           - Discharge  patient to home (with escort).                           - Await pathology results.                           - If patient is willing, then plan for a repeat                            colonoscopy tomorrow with an additional day of                            prep. If not able to schedule colonoscopy for                            tomorrow, then reschedule with a two day prep in                            the future.                           -  The findings and recommendations were discussed                            with the patient. Dr Georgian Co "Lyndee Leo" [place],  02/08/2023 2:36:51 PM

## 2023-02-08 NOTE — Progress Notes (Signed)
Called to room to assist during endoscopic procedure.  Patient ID and intended procedure confirmed with present staff. Received instructions for my participation in the procedure from the performing physician.  

## 2023-02-08 NOTE — Patient Instructions (Signed)
YOU HAD AN ENDOSCOPIC PROCEDURE TODAY AT THE [facility]:   Refer to the procedure report that was given to you for any specific questions about what was found during the examination.  If the procedure report does not answer your questions, please call your gastroenterologist to clarify.  If you requested that your care partner not be given the details of your procedure findings, then the procedure report has been included in a sealed envelope for you to review at your convenience later.  YOU SHOULD EXPECT: Some feelings of bloating in the abdomen. Passage of more gas than usual.  Walking can help get rid of the air that was put into your GI tract during the procedure and reduce the bloating. If you had a lower endoscopy (such as a colonoscopy or flexible sigmoidoscopy) you may notice spotting of blood in your stool or on the toilet paper. If you underwent a bowel prep for your procedure, you may not have a normal bowel movement for a few days.  Please Note:  You might notice some irritation and congestion in your nose or some drainage.  This is from the oxygen used during your procedure.  There is no need for concern and it should clear up in a day or so.  SYMPTOMS TO REPORT IMMEDIATELY:  Following lower endoscopy (colonoscopy or flexible sigmoidoscopy):  Excessive amounts of blood in the stool  Significant tenderness or worsening of abdominal pains  Swelling of the abdomen that is new, acute  Fever of 100F or higher  For urgent or emergent issues, a gastroenterologist can be reached at any hour by calling [phone]. Do not use MyChart messaging for urgent concerns.    DIET:  We do recommend a small meal at first, but then you may proceed to your regular diet.  Drink plenty of fluids but you should avoid alcoholic beverages for 24 hours.  ACTIVITY:  You should plan to take it easy for the rest of today and you should NOT DRIVE or use heavy machinery until tomorrow (because of  the sedation medicines used during the test).    FOLLOW UP: Our staff will call the number listed on your records the next business day following your procedure.  We will call around 7:15- 8:00 am to check on you and address any questions or concerns that you may have regarding the information given to you following your procedure. If we do not reach you, we will leave a message.     If any biopsies were taken you will be contacted by phone or by letter within the next 1-3 weeks.  Please call us at [phone] if you have not heard about the biopsies in 3 weeks.    SIGNATURES/CONFIDENTIALITY: You and/or your care partner have signed paperwork which will be entered into your electronic medical record.  These signatures attest to the fact that that the information above on your After Visit Summary has been reviewed and is understood.  Full responsibility of the confidentiality of this discharge information lies with you and/or your care-partner.  

## 2023-02-08 NOTE — Progress Notes (Signed)
Pt's states no medical or surgical changes since previsit or office visit. 

## 2023-02-08 NOTE — Progress Notes (Signed)
GASTROENTEROLOGY PROCEDURE H&P NOTE   Primary Care Physician: Hoyt Koch, MD    Reason for Procedure:   Colon cancer screening  Plan:    Colonoscopy  Patient is appropriate for endoscopic procedure(s) in the ambulatory ([place]) setting.  The nature of the procedure, as well as the risks, benefits, and alternatives were carefully and thoroughly reviewed with the patient. Ample time for discussion and questions allowed. The patient understood, was satisfied, and agreed to proceed.     HPI: Andre Clements is a 52 y.o. male who presents for colonoscopy for evaluation of colon cancer screening .  Patient was most recently seen in the Gastroenterology Clinic on 02/03/23.  No interval change in medical history since that appointment. Please refer to that note for full details regarding GI history and clinical presentation.   Past Medical History:  Diagnosis Date   Anxiety    Bipolar 2 disorder ([place])    Blood in stool    Depression    History of spinal cord injury     form accident,paralysis of BLE   Hyperlipidemia    Hypertension    Urine incontinence     Past Surgical History:  Procedure Laterality Date   ABDOMINAL HERNIA REPAIR  2003   arm surgery Right    from suicide attempt   CYSTOSCOPY WITH LITHOLAPAXY N/A 08/26/2018   Procedure: CYSTOSCOPY WITH LITHOLAPAXY;  Surgeon: Ceasar Mons, MD;  Location: [facility];  Service: Urology;  Laterality: N/A;    Prior to Admission medications   Medication Sig Start Date End Date Taking? Authorizing Provider  Ascorbic Acid (VITAMIN C) 250 MG CHEW Chew 500 each by mouth daily.     [provider]  Cholecalciferol (VITAMIN D3) 5000 units CAPS Take 5,000 Units by mouth daily.     [provider]  ferrous sulfate 325 (65 FE) MG tablet Take 325 mg by mouth daily.     [provider]  finasteride (PROSCAR) 5 MG tablet Take 1 tablet (5 mg total) by mouth daily. 04/27/21   Hoyt Koch, MD   haloperidol (HALDOL) 5 MG tablet Take 1 tablet (5 mg total) by mouth daily. 04/27/21   Hoyt Koch, MD  lisinopril (ZESTRIL) 40 MG tablet Take 1 tablet (40 mg total) by mouth daily. 04/27/21   Hoyt Koch, MD  metFORMIN (GLUCOPHAGE-XR) 500 MG 24 hr tablet Take 1 tablet (500 mg total) by mouth daily with breakfast. 04/27/21   Hoyt Koch, MD  Multiple Vitamins-Minerals (CENTRUM VITAMINTS PO) Take 1 tablet by mouth daily.    [provider]  omeprazole (PRILOSEC) 40 MG capsule Take 1 capsule (40 mg total) by mouth daily. 04/27/21   Hoyt Koch, MD  sertraline (ZOLOFT) 100 MG tablet Take 2 tablets (200 mg total) by mouth daily. 04/27/21   Hoyt Koch, MD  simvastatin (ZOCOR) 40 MG tablet Take 1 tablet (40 mg total) by mouth daily at 6 PM. 04/27/21   Hoyt Koch, MD    Current Outpatient Medications  Medication Sig Dispense Refill   Ascorbic Acid (VITAMIN C) 250 MG CHEW Chew 500 each by mouth daily.      Cholecalciferol (VITAMIN D3) 5000 units CAPS Take 5,000 Units by mouth daily.      ferrous sulfate 325 (65 FE) MG tablet Take 325 mg by mouth daily.      finasteride (PROSCAR) 5 MG tablet Take 1 tablet (5 mg total) by mouth daily. 90 tablet 3  haloperidol (HALDOL) 5 MG tablet Take 1 tablet (5 mg total) by mouth daily. 90 tablet 3   lisinopril (ZESTRIL) 40 MG tablet Take 1 tablet (40 mg total) by mouth daily. 90 tablet 3   metFORMIN (GLUCOPHAGE-XR) 500 MG 24 hr tablet Take 1 tablet (500 mg total) by mouth daily with breakfast. 90 tablet 3   Multiple Vitamins-Minerals (CENTRUM VITAMINTS PO) Take 1 tablet by mouth daily.     omeprazole (PRILOSEC) 40 MG capsule Take 1 capsule (40 mg total) by mouth daily. 90 capsule 3   sertraline (ZOLOFT) 100 MG tablet Take 2 tablets (200 mg total) by mouth daily. 180 tablet 3   simvastatin (ZOCOR) 40 MG tablet Take 1 tablet (40 mg total) by mouth daily at 6 PM. 90 tablet 3   Current  Facility-Administered Medications  Medication Dose Route Frequency Provider Last Rate Last Admin   0.9 %  sodium chloride infusion  500 mL Intravenous Continuous Sharyn Creamer, MD        Allergies as of 02/08/2023   (No Known Allergies)    Family History  Problem Relation Age of Onset   Alcohol abuse Mother    Hypertension Mother    Alcohol abuse Father    Other Neg Hx        hypogonadism   Stomach cancer Neg Hx    Colon cancer Neg Hx    Esophageal cancer Neg Hx     Social History   Socioeconomic History   Marital status: Single    Spouse name: Not on file   Number of children: 0   Years of education: Not on file   Highest education level: Not on file  Occupational History   Occupation: disability   Occupation: retired  Tobacco Use   Smoking status: Never   Smokeless tobacco: Never  Vaping Use   Vaping Use: Never used  Substance and Sexual Activity   Alcohol use: No    Alcohol/week: 0.0 standard drinks of alcohol   Drug use: No   Sexual activity: Not Currently  Other Topics Concern   Not on file  Social History Narrative   Not on file   Social Determinants of Health   Financial Resource Strain: Low Risk  (10/28/2020)   Overall Financial Resource Strain (CARDIA)    Difficulty of Paying Living Expenses: Not hard at all  Food Insecurity: No Food Insecurity (10/28/2020)   Hunger Vital Sign    Worried About Running Out of Food in the Last Year: Never true    Ran Out of Food in the Last Year: Never true  Transportation Needs: Unmet Transportation Needs (10/28/2020)   PRAPARE - Transportation    Lack of Transportation (Medical): Yes    Lack of Transportation (Non-Medical): Yes  Physical Activity: Inactive (10/28/2020)   Exercise Vital Sign    Days of Exercise per Week: 0 days    Minutes of Exercise per Session: 0 min  Stress: No Stress Concern Present (10/28/2020)   [employer] [place] of  Stress : Not at all  Social Connections: Unknown (10/28/2020)   Social Connection and Isolation Panel [NHANES]    Frequency of Communication with Friends and Family: More than three times a week    Frequency of Social Gatherings with Friends and Family: More than three times a week    Attends Religious Services: Patient refused    Active Member of Clubs or Organizations: Patient refused    Attends  Archivist Meetings: Patient refused    Marital Status: Never married  Human resources officer Violence: Not on file    Physical Exam: Vital signs in last 24 hours: BP (!) 164/90   Pulse 62   Temp 98.7 F (37.1 C)   Ht 5' 8"$  (1.727 m)   SpO2 97%   BMI 40.60 kg/m  GEN: NAD EYE: Sclerae anicteric ENT: MMM CV: Non-tachycardic Pulm: No increased WOB GI: Soft NEURO:  Alert & Oriented   Christia Reading, MD [facility]   02/08/2023 1:21 PM

## 2023-02-08 NOTE — Progress Notes (Signed)
Sedate, gd SR, tolerated procedure well, VSS, report to RN 

## 2023-02-09 ENCOUNTER — Telehealth: Payer: Self-pay | Admitting: *Deleted

## 2023-02-09 NOTE — Telephone Encounter (Signed)
  Follow up Call-     02/08/2023    1:19 PM  Call back number  Post procedure Call Back phone  # [phone]  Permission to leave phone message Yes   [facility]

## 2023-02-10 ENCOUNTER — Telehealth: Payer: Self-pay | Admitting: Internal Medicine

## 2023-02-10 NOTE — Telephone Encounter (Signed)
Patient is calling wishing to ask Dr Lorenso Courier about a possible procedure for him to have where he would be able to feel his bowel movements coming. Please advise

## 2023-02-13 NOTE — Telephone Encounter (Signed)
Spoke with the patient. He does not know if there is such a procedure, but he was hoping you could tell him. He wants to be able to feel or know when his bowels are ready to move. Patient is paraplegic.

## 2023-02-14 ENCOUNTER — Encounter: Payer: Self-pay | Admitting: Internal Medicine

## 2023-02-14 ENCOUNTER — Other Ambulatory Visit: Payer: Self-pay

## 2023-02-14 DIAGNOSIS — K5900 Constipation, unspecified: Secondary | ICD-10-CM

## 2023-02-14 DIAGNOSIS — M6289 Other specified disorders of muscle: Secondary | ICD-10-CM

## 2023-02-14 NOTE — Telephone Encounter (Signed)
Patient is willing to look into PT for pelvic floor training. Referral to [facility] PT at [place] placed through [facility]

## 2023-02-22 ENCOUNTER — Encounter: Payer: Self-pay | Admitting: Internal Medicine

## 2023-03-31 NOTE — Therapy (Deleted)
OUTPATIENT PHYSICAL THERAPY MALE PELVIC EVALUATION   Patient Name: Andre Clements MRN: [id number] DOB:10/08/1971, 52 y.o., male Today's Date: 03/31/2023  END OF SESSION:   Past Medical History:  Diagnosis Date   Anxiety    Bipolar 2 disorder (HCC)    Blood in stool    Depression    History of spinal cord injury     form accident,paralysis of BLE   Hyperlipidemia    Hypertension    Urine incontinence    Past Surgical History:  Procedure Laterality Date   ABDOMINAL HERNIA REPAIR  2003   arm surgery Right    from suicide attempt   CYSTOSCOPY WITH LITHOLAPAXY N/A 08/26/2018   Procedure: CYSTOSCOPY WITH LITHOLAPAXY;  Surgeon: Rene Paci, MD;  Location: [facility];  Service: Urology;  Laterality: N/A;   Patient Active Problem List   Diagnosis Date Noted   Right knee pain 01/29/2019   Hair loss 01/29/2019   Impaired fasting blood sugar 09/26/2017   Routine general medical examination at a health care facility 06/24/2016   Ventral hernia 05/26/2016   Pituitary insufficiency 03/04/2016   Hypogonadism male 03/01/2016   Essential hypertension 09/16/2015   Hyperlipidemia 09/16/2015   Paralysis of both lower limbs 09/16/2015   Mood disorder (HCC) 09/16/2015    PCP: Myrlene Broker, MD  REFERRING PROVIDER: Imogene Burn, MD   REFERRING DIAG: K59.00 (ICD-10-CM) - Constipation, unspecified constipation typeM62.89 (ICD-10-CM) - Pelvic floor dysfunction  THERAPY DIAG:  No diagnosis found.  Rationale for Evaluation and Treatment: Rehabilitation  ONSET DATE: ***  SUBJECTIVE:                                                                                                                                                                                           SUBJECTIVE STATEMENT: *** Fluid intake: ***  PAIN:  Are you having pain? {yes/no:20286} NPRS scale: ***/10 Pain location: {pelvic pain location:27098}  Pain type: {type:313116} Pain description:  {PAIN DESCRIPTION:21022940}   Aggravating factors: *** Relieving factors: ***  PRECAUTIONS: {Therapy precautions:24002}  WEIGHT BEARING RESTRICTIONS: {Yes ***/No:24003}  FALLS:  Has patient fallen in last 6 months? {fallsyesno:27318}  LIVING ENVIRONMENT: Lives with: {OPRC lives with:25569::"lives with their family"} Lives in: {Lives in:25570} Stairs: {opstairs:27293} Has following equipment at home: {Assistive devices:23999}  OCCUPATION: ***  PLOF: {PLOF:24004}  PATIENT GOALS: ***  PERTINENT HISTORY:  Abdominal hernia repair; spinal cord injury leading to paraplegia   BOWEL MOVEMENT: Pain with bowel movement: {yes/no:20286} Type of bowel movement:{PT BM type:27100} Fully empty rectum: {Yes/No:304960894} Leakage: {Yes/No:304960894} Pads: {Yes/No:304960894} Fiber supplement: {Yes/No:304960894}  URINATION: Pain with urination: {yes/no:20286} Fully empty bladder: {Yes/No:304960894} Stream: {PT  urination:27102} Urgency: {Yes/No:304960894} Frequency: *** Leakage: {PT leakage:27103} Pads: {Yes/No:304960894}  INTERCOURSE: Pain with intercourse: {pain with intercourse PA:27099} Climax: *** Ejaculation: {Yes/No:304960894}   OBJECTIVE:   DIAGNOSTIC FINDINGS:  ***  PATIENT SURVEYS:  {rehab surveys:24030}  PFIQ-7 ***  COGNITION: Overall cognitive status: {cognition:24006}     SENSATION: Light touch: {intact/deficits:24005} Proprioception: {intact/deficits:24005}  MUSCLE LENGTH: Hamstrings: Right *** deg; Left *** deg Thomas test: Right *** deg; Left *** deg  LUMBAR SPECIAL TESTS:  {lumbar special test:25242}  FUNCTIONAL TESTS:  {Functional tests:24029}  GAIT: Distance walked: *** Assistive device utilized: {Assistive devices:23999} Level of assistance: {Levels of assistance:24026} Comments: ***  POSTURE: {posture:25561}  PELVIC ALIGNMENT:  LUMBARAROM/PROM:  A/PROM A/PROM  eval  Flexion   Extension   Right lateral flexion   Left lateral  flexion   Right rotation   Left rotation    (Blank rows = not tested)  LOWER EXTREMITY AROM/PROM:  A/PROM Right eval Left eval  Hip flexion    Hip extension    Hip abduction    Hip adduction    Hip internal rotation    Hip external rotation    Knee flexion    Knee extension    Ankle dorsiflexion    Ankle plantarflexion    Ankle inversion    Ankle eversion     (Blank rows = not tested)  LOWER EXTREMITY MMT:  MMT Right eval Left eval  Hip flexion    Hip extension    Hip abduction    Hip adduction    Hip internal rotation    Hip external rotation    Knee flexion    Knee extension    Ankle dorsiflexion    Ankle plantarflexion    Ankle inversion    Ankle eversion     PALPATION: GENERAL ***              External Perineal Exam ***              Internal Pelvic Floor *** Patient confirms identification and approves PT to assess internal pelvic floor and treatment {yes/no:20286}  PELVIC MMT:   MMT eval  Internal Anal Sphincter   External Anal Sphincter   Puborectalis   Diastasis Recti   (Blank rows = not tested)  TONE: ***  TODAY'S TREATMENT:                                                                                                                              DATE: ***  EVAL ***   PATIENT EDUCATION:  Education details: *** Person educated: {Person educated:25204} Education method: {Education Method:25205} Education comprehension: {Education Comprehension:25206}  HOME EXERCISE PROGRAM: ***  ASSESSMENT:  CLINICAL IMPRESSION: Patient is a *** y.o. *** who was seen today for physical therapy evaluation and treatment for ***.   OBJECTIVE IMPAIRMENTS: {opptimpairments:25111}.   ACTIVITY LIMITATIONS: {activitylimitations:27494}  PARTICIPATION LIMITATIONS: {participationrestrictions:25113}  PERSONAL FACTORS: {Personal factors:25162} are also affecting patient's functional outcome.   REHAB POTENTIAL: {rehabpotential:25112}  CLINICAL  DECISION MAKING: {clinical decision making:25114}  EVALUATION COMPLEXITY: {Evaluation complexity:25115}   GOALS: Goals reviewed with patient? {yes/no:20286}  SHORT TERM GOALS: Target date: ***  *** Baseline: Goal status: {GOALSTATUS:25110}  2.  *** Baseline:  Goal status: {GOALSTATUS:25110}  3.  *** Baseline:  Goal status: {GOALSTATUS:25110}  4.  *** Baseline:  Goal status: {GOALSTATUS:25110}  5.  *** Baseline:  Goal status: {GOALSTATUS:25110}  6.  *** Baseline:  Goal status: {GOALSTATUS:25110}  LONG TERM GOALS: Target date: ***  *** Baseline:  Goal status: {GOALSTATUS:25110}  2.  *** Baseline:  Goal status: {GOALSTATUS:25110}  3.  *** Baseline:  Goal status: {GOALSTATUS:25110}  4.  *** Baseline:  Goal status: {GOALSTATUS:25110}  5.  *** Baseline:  Goal status: {GOALSTATUS:25110}  6.  *** Baseline:  Goal status: {GOALSTATUS:25110}   PLAN:  PT FREQUENCY: {rehab frequency:25116}  PT DURATION: {rehab duration:25117}  PLANNED INTERVENTIONS: {rehab planned interventions:25118::"Therapeutic exercises","Therapeutic activity","Neuromuscular re-education","Balance training","Gait training","Patient/Family education","Self Care","Joint mobilization"}  PLAN FOR NEXT SESSION: ***   Okema Rollinson, PT 03/31/2023, 11:55 AM

## 2023-04-03 ENCOUNTER — Other Ambulatory Visit: Payer: Self-pay

## 2023-04-03 ENCOUNTER — Telehealth: Payer: Self-pay | Admitting: Internal Medicine

## 2023-04-03 ENCOUNTER — Ambulatory Visit: Payer: Medicare Other | Admitting: Physical Therapy

## 2023-04-03 MED ORDER — NA SULFATE-K SULFATE-MG SULF 17.5-3.13-1.6 GM/177ML PO SOLN: ORAL | 0 refills | Status: DC

## 2023-04-03 NOTE — Telephone Encounter (Signed)
Spoke with the patient. He needs his prep for his procedure. He asks for 2 preps stating I have to take it 5 days before the procedure. Explained the diet would change 5 days before the procedure. He will not need 2 prep kits. Asked if he had his written prep instructions. The patient tells me he does. Explained the diet change that will begin on 04/05/23. Asked patient to look over the instructions again and call if he is unsure about when to take his prep. Patient states he will review his instructions.

## 2023-04-03 NOTE — Telephone Encounter (Signed)
PT is calling to find out if his prep medication can be doubled and sent to [facility] [address]. Please advise.

## 2023-04-10 ENCOUNTER — Encounter: Payer: Self-pay | Admitting: Internal Medicine

## 2023-04-10 ENCOUNTER — Ambulatory Visit (AMBULATORY_SURGERY_CENTER): Payer: Medicare Other | Admitting: Internal Medicine

## 2023-04-10 ENCOUNTER — Telehealth: Payer: Self-pay | Admitting: Internal Medicine

## 2023-04-10 VITALS — BP 129/73 | HR 78 | Temp 98.0°F | Resp 23 | Ht 68.0 in | Wt 267.0 lb

## 2023-04-10 DIAGNOSIS — Z1211 Encounter for screening for malignant neoplasm of colon: Secondary | ICD-10-CM

## 2023-04-10 DIAGNOSIS — D123 Benign neoplasm of transverse colon: Secondary | ICD-10-CM

## 2023-04-10 DIAGNOSIS — K635 Polyp of colon: Secondary | ICD-10-CM

## 2023-04-10 MED ORDER — SODIUM CHLORIDE 0.9 % IV SOLN: 500.0000 mL | INTRAVENOUS | Status: DC

## 2023-04-10 NOTE — Progress Notes (Signed)
Vitals-DT  Pt's states no medical or surgical changes since previsit or office visit.  

## 2023-04-10 NOTE — Patient Instructions (Signed)
Discharge instructions given. Handouts on polyps. Resume previous medications. YOU HAD AN ENDOSCOPIC PROCEDURE TODAY AT THE [facility]:   Refer to the procedure report that was given to you for any specific questions about what was found during the examination.  If the procedure report does not answer your questions, please call your gastroenterologist to clarify.  If you requested that your care partner not be given the details of your procedure findings, then the procedure report has been included in a sealed envelope for you to review at your convenience later.  YOU SHOULD EXPECT: Some feelings of bloating in the abdomen. Passage of more gas than usual.  Walking can help get rid of the air that was put into your GI tract during the procedure and reduce the bloating. If you had a lower endoscopy (such as a colonoscopy or flexible sigmoidoscopy) you may notice spotting of blood in your stool or on the toilet paper. If you underwent a bowel prep for your procedure, you may not have a normal bowel movement for a few days.  Please Note:  You might notice some irritation and congestion in your nose or some drainage.  This is from the oxygen used during your procedure.  There is no need for concern and it should clear up in a day or so.  SYMPTOMS TO REPORT IMMEDIATELY:  Following lower endoscopy (colonoscopy or flexible sigmoidoscopy):  Excessive amounts of blood in the stool  Significant tenderness or worsening of abdominal pains  Swelling of the abdomen that is new, acute  Fever of 100F or higher   For urgent or emergent issues, a gastroenterologist can be reached at any hour by calling [phone]. Do not use MyChart messaging for urgent concerns.    DIET:  We do recommend a small meal at first, but then you may proceed to your regular diet.  Drink plenty of fluids but you should avoid alcoholic beverages for 24 hours.  ACTIVITY:  You should plan to take it easy for the rest  of today and you should NOT DRIVE or use heavy machinery until tomorrow (because of the sedation medicines used during the test).    FOLLOW UP: Our staff will call the number listed on your records the next business day following your procedure.  We will call around 7:15- 8:00 am to check on you and address any questions or concerns that you may have regarding the information given to you following your procedure. If we do not reach you, we will leave a message.     If any biopsies were taken you will be contacted by phone or by letter within the next 1-3 weeks.  Please call us at [phone] if you have not heard about the biopsies in 3 weeks.    SIGNATURES/CONFIDENTIALITY: You and/or your care partner have signed paperwork which will be entered into your electronic medical record.  These signatures attest to the fact that that the information above on your After Visit Summary has been reviewed and is understood.  Full responsibility of the confidentiality of this discharge information lies with you and/or your care-partner.

## 2023-04-10 NOTE — Op Note (Signed)
[facility] Patient Name: Andre Clements Procedure Date: 04/10/2023 12:11 PM MRN: [id number] Endoscopist: Madelyn Brunner [place] , , [id number] Age: 52 Referring MD:  Date of Birth: Oct 22, 1971 Gender: Male Account #: [id number] Procedure:                Colonoscopy Indications:              Screening for colorectal malignant neoplasm Medicines:                Monitored Anesthesia Care Procedure:                Pre-Anesthesia Assessment:                           - Prior to the procedure, a History and Physical                            was performed, and patient medications and                            allergies were reviewed. The patient's tolerance of                            previous anesthesia was also reviewed. The risks                            and benefits of the procedure and the sedation                            options and risks were discussed with the patient.                            All questions were answered, and informed consent                            was obtained. Prior Anticoagulants: The patient has                            taken no anticoagulant or antiplatelet agents. ASA                            Grade Assessment: III - A patient with severe                            systemic disease. After reviewing the risks and                            benefits, the patient was deemed in satisfactory                            condition to undergo the procedure.                           After obtaining informed consent, the colonoscope  was passed under direct vision. Throughout the                            procedure, the patient's blood pressure, pulse, and                            oxygen saturations were monitored continuously. The                            CF [id number] #[id number] was introduced through the anus                            and advanced to the the terminal ileum. The                            colonoscopy  was performed without difficulty. The                            patient tolerated the procedure well. The quality                            of the bowel preparation was adequate. The terminal                            ileum, ileocecal valve, appendiceal orifice, and                            rectum were photographed. Rectal retroflexion was                            not performed due to the inflammation that was                            present. Scope In: 12:19:45 PM Scope Out: 12:50:14 PM Scope Withdrawal Time: 0 hours 24 minutes 41 seconds  Total Procedure Duration: 0 hours 30 minutes 29 seconds  Findings:                 The terminal ileum appeared normal.                           Two sessile polyps were found in the transverse                            colon. The polyps were 3 to 4 mm in size. These                            polyps were removed with a cold snare. Resection                            and retrieval were complete.                           Localized inflammation characterized by congestion                            (  edema), erythema and shallow ulcerations was found                            in the rectum. Complications:            No immediate complications. Estimated Blood Loss:     Estimated blood loss was minimal. Impression:               - The examined portion of the ileum was normal.                           - Two 3 to 4 mm polyps in the transverse colon,                            removed with a cold snare. Resected and retrieved.                           - Localized inflammation was found in the rectum. Recommendation:           - Discharge patient to home (with escort).                           - Await pathology results.                           - The findings and recommendations were discussed                            with the patient. Dr Particia Lather "Alan Ripper" Leonides Schanz,  04/10/2023 12:55:26 PM

## 2023-04-10 NOTE — Progress Notes (Signed)
Called to room to assist during endoscopic procedure.  Patient ID and intended procedure confirmed with present staff. Received instructions for my participation in the procedure from the performing physician.  

## 2023-04-10 NOTE — Progress Notes (Signed)
Pt awake, alert and oriented. VSS. Airway intact. SBAR complete to RN. All questions answered.   

## 2023-04-10 NOTE — Progress Notes (Signed)
GASTROENTEROLOGY PROCEDURE H&P NOTE   Primary Care Physician: Myrlene Broker, MD    Reason for Procedure:   Colon cancer screening  Plan:    Colonoscopy  Patient is appropriate for endoscopic procedure(s) in the ambulatory ([facility]) setting.  The nature of the procedure, as well as the risks, benefits, and alternatives were carefully and thoroughly reviewed with the patient. Ample time for discussion and questions allowed. The patient understood, was satisfied, and agreed to proceed.     HPI: Andre Clements is a 51 y.o. male who presents for colonoscopy for evaluation of colon cancer screening .  Patient was most recently seen in the Gastroenterology Clinic on 02/03/23.  No interval change in medical history since that appointment. Please refer to that note for full details regarding GI history and clinical presentation.   Past Medical History:  Diagnosis Date   Anxiety    Bipolar 2 disorder    Blood in stool    Depression    History of spinal cord injury     form accident,paralysis of BLE   Hyperlipidemia    Hypertension    Urine incontinence     Past Surgical History:  Procedure Laterality Date   ABDOMINAL HERNIA REPAIR  2003   arm surgery Right    from suicide attempt   CYSTOSCOPY WITH LITHOLAPAXY N/A 08/26/2018   Procedure: CYSTOSCOPY WITH LITHOLAPAXY;  Surgeon: Rene Paci, MD;  Location: [facility];  Service: Urology;  Laterality: N/A;    Prior to Admission medications   Medication Sig Start Date End Date Taking? Authorizing Provider  Ascorbic Acid (VITAMIN C) 250 MG CHEW Chew 500 each by mouth daily.    Yes [provider]  Cholecalciferol (VITAMIN D3) 5000 units CAPS Take 5,000 Units by mouth daily.    Yes [provider]  finasteride (PROSCAR) 5 MG tablet Take 1 tablet (5 mg total) by mouth daily. 04/27/21  Yes Myrlene Broker, MD  haloperidol (HALDOL) 5 MG tablet Take 1 tablet (5 mg total) by mouth daily. 04/27/21  Yes  Myrlene Broker, MD  lisinopril (ZESTRIL) 40 MG tablet Take 1 tablet (40 mg total) by mouth daily. 04/27/21  Yes Myrlene Broker, MD  metFORMIN (GLUCOPHAGE-XR) 500 MG 24 hr tablet Take 1 tablet (500 mg total) by mouth daily with breakfast. 04/27/21  Yes Myrlene Broker, MD  Multiple Vitamins-Minerals (CENTRUM VITAMINTS PO) Take 1 tablet by mouth daily.   Yes [provider]  omeprazole (PRILOSEC) 40 MG capsule Take 1 capsule (40 mg total) by mouth daily. 04/27/21  Yes Myrlene Broker, MD  sertraline (ZOLOFT) 100 MG tablet Take 2 tablets (200 mg total) by mouth daily. 04/27/21  Yes Myrlene Broker, MD  simvastatin (ZOCOR) 40 MG tablet Take 1 tablet (40 mg total) by mouth daily at 6 PM. 04/27/21  Yes Myrlene Broker, MD  ferrous sulfate 325 (65 FE) MG tablet Take 325 mg by mouth daily.     [provider]    Current Outpatient Medications  Medication Sig Dispense Refill   Ascorbic Acid (VITAMIN C) 250 MG CHEW Chew 500 each by mouth daily.      Cholecalciferol (VITAMIN D3) 5000 units CAPS Take 5,000 Units by mouth daily.      finasteride (PROSCAR) 5 MG tablet Take 1 tablet (5 mg total) by mouth daily. 90 tablet 3   haloperidol (HALDOL) 5 MG tablet Take 1 tablet (5 mg total) by mouth daily. 90 tablet 3  lisinopril (ZESTRIL) 40 MG tablet Take 1 tablet (40 mg total) by mouth daily. 90 tablet 3   metFORMIN (GLUCOPHAGE-XR) 500 MG 24 hr tablet Take 1 tablet (500 mg total) by mouth daily with breakfast. 90 tablet 3   Multiple Vitamins-Minerals (CENTRUM VITAMINTS PO) Take 1 tablet by mouth daily.     omeprazole (PRILOSEC) 40 MG capsule Take 1 capsule (40 mg total) by mouth daily. 90 capsule 3   sertraline (ZOLOFT) 100 MG tablet Take 2 tablets (200 mg total) by mouth daily. 180 tablet 3   simvastatin (ZOCOR) 40 MG tablet Take 1 tablet (40 mg total) by mouth daily at 6 PM. 90 tablet 3   ferrous sulfate 325 (65 FE) MG tablet Take 325 mg by mouth daily.       Current Facility-Administered Medications  Medication Dose Route Frequency Provider Last Rate Last Admin   0.9 %  sodium chloride infusion  500 mL Intravenous Continuous Imogene Burn, MD        Allergies as of 04/10/2023   (No Known Allergies)    Family History  Problem Relation Age of Onset   Alcohol abuse Mother    Hypertension Mother    Alcohol abuse Father    Other Neg Hx        hypogonadism   Stomach cancer Neg Hx    Colon cancer Neg Hx    Esophageal cancer Neg Hx     Social History   Socioeconomic History   Marital status: Single    Spouse name: Not on file   Number of children: 0   Years of education: Not on file   Highest education level: Not on file  Occupational History   Occupation: disability   Occupation: retired  Tobacco Use   Smoking status: Never   Smokeless tobacco: Never  Vaping Use   Vaping Use: Never used  Substance and Sexual Activity   Alcohol use: No    Alcohol/week: 0.0 standard drinks of alcohol   Drug use: No   Sexual activity: Not Currently  Other Topics Concern   Not on file  Social History Narrative   Not on file   Social Determinants of Health   Financial Resource Strain: Low Risk  (10/28/2020)   Overall Financial Resource Strain (CARDIA)    Difficulty of Paying Living Expenses: Not hard at all  Food Insecurity: No Food Insecurity (10/28/2020)   Hunger Vital Sign    Worried About Running Out of Food in the Last Year: Never true    Ran Out of Food in the Last Year: Never true  Transportation Needs: Unmet Transportation Needs (10/28/2020)   PRAPARE - Transportation    Lack of Transportation (Medical): Yes    Lack of Transportation (Non-Medical): Yes  Physical Activity: Inactive (10/28/2020)   Exercise Vital Sign    Days of Exercise per Week: 0 days    Minutes of Exercise per Session: 0 min  Stress: No Stress Concern Present (10/28/2020)   [employer] of Occupational Health - Occupational Stress Questionnaire     Feeling of Stress : Not at all  Social Connections: Unknown (10/28/2020)   Social Connection and Isolation Panel [NHANES]    Frequency of Communication with Friends and Family: More than three times a week    Frequency of Social Gatherings with Friends and Family: More than three times a week    Attends Religious Services: Patient declined    Active Member of Clubs or Organizations: Patient declined    Attends [employer]  or Organization Meetings: Patient declined    Marital Status: Never married  Intimate Partner Violence: Not on file    Physical Exam: Vital signs in last 24 hours: BP (!) 149/95 (BP Location: Right Arm, Patient Position: Sitting, Cuff Size: Normal)   Pulse 96   Temp 98 F (36.7 C) (Temporal)   Ht  (1.727 m)   Wt 267 lb (121.1 kg)   SpO2 98%   BMI 40.60 kg/m  GEN: NAD EYE: Sclerae anicteric ENT: MMM CV: Non-tachycardic Pulm: No increased WOB GI: Soft NEURO:  Alert & Oriented   Eulah Pont, MD [facility]   04/10/2023 11:36 AM

## 2023-04-10 NOTE — Telephone Encounter (Signed)
PT is calling to  find out what were the two polyps that were found during his procedure today. Please advise.

## 2023-04-10 NOTE — Progress Notes (Signed)
Vitals DT  Pt's states no medical or surgical changes since previsit or office visit.  Newly diagnosed diabetic.

## 2023-04-10 NOTE — Telephone Encounter (Signed)
Called the patient. No answer. Left a voicemail. The polyps will be examined by a pathologist. We will mail his results to him as explained in his discharge instructions.

## 2023-04-11 ENCOUNTER — Telehealth: Payer: Self-pay

## 2023-04-11 NOTE — Telephone Encounter (Signed)
  Follow up Call-     04/10/2023   11:28 AM 04/10/2023   11:11 AM 02/08/2023    1:19 PM  Call back number  Post procedure Call Back phone  # [phone]  [phone]  Permission to leave phone message  Yes Yes     Patient questions:  Do you have a fever, pain , or abdominal swelling? No. Pain Score  0 *  Have you tolerated food without any problems? Yes.    Have you been able to return to your normal activities? Yes.    Do you have any questions about your discharge instructions: Diet   No. Medications  No. Follow up visit  No.  Do you have questions or concerns about your Care? No.  Actions: * If pain score is 4 or above: No action needed, pain <4.

## 2023-04-13 ENCOUNTER — Encounter: Payer: Self-pay | Admitting: Internal Medicine

## 2023-05-24 ENCOUNTER — Emergency Department (HOSPITAL_COMMUNITY): Payer: Medicare Other

## 2023-05-24 ENCOUNTER — Ambulatory Visit (HOSPITAL_COMMUNITY)
Admission: EM | Admit: 2023-05-24 | Discharge: 2023-05-24 | Disposition: A | Discharge status: Home / Self Care | Payer: Medicare Other | Attending: Emergency Medicine | Admitting: Emergency Medicine

## 2023-05-24 ENCOUNTER — Encounter (HOSPITAL_COMMUNITY): Payer: Self-pay

## 2023-05-24 ENCOUNTER — Inpatient Hospital Stay (HOSPITAL_COMMUNITY)
Admission: EM | Admit: 2023-05-24 | Discharge: 2023-05-28 | DRG: 660 | Disposition: A | Discharge status: Home / Self Care | Payer: Medicare Other | Attending: Internal Medicine | Admitting: Internal Medicine

## 2023-05-24 DIAGNOSIS — Z8249 Family history of ischemic heart disease and other diseases of the circulatory system: Secondary | ICD-10-CM

## 2023-05-24 DIAGNOSIS — F3181 Bipolar II disorder: Secondary | ICD-10-CM | POA: Diagnosis present

## 2023-05-24 DIAGNOSIS — K5900 Constipation, unspecified: Secondary | ICD-10-CM | POA: Diagnosis present

## 2023-05-24 DIAGNOSIS — E785 Hyperlipidemia, unspecified: Secondary | ICD-10-CM | POA: Diagnosis present

## 2023-05-24 DIAGNOSIS — N210 Calculus in bladder: Secondary | ICD-10-CM | POA: Diagnosis present

## 2023-05-24 DIAGNOSIS — E230 Hypopituitarism: Secondary | ICD-10-CM | POA: Diagnosis present

## 2023-05-24 DIAGNOSIS — D509 Iron deficiency anemia, unspecified: Secondary | ICD-10-CM | POA: Diagnosis present

## 2023-05-24 DIAGNOSIS — R197 Diarrhea, unspecified: Secondary | ICD-10-CM

## 2023-05-24 DIAGNOSIS — Z811 Family history of alcohol abuse and dependence: Secondary | ICD-10-CM

## 2023-05-24 DIAGNOSIS — E872 Acidosis, unspecified: Secondary | ICD-10-CM | POA: Diagnosis present

## 2023-05-24 DIAGNOSIS — R1084 Generalized abdominal pain: Secondary | ICD-10-CM

## 2023-05-24 DIAGNOSIS — N139 Obstructive and reflux uropathy, unspecified: Secondary | ICD-10-CM | POA: Diagnosis present

## 2023-05-24 DIAGNOSIS — I10 Essential (primary) hypertension: Secondary | ICD-10-CM | POA: Diagnosis present

## 2023-05-24 DIAGNOSIS — R7301 Impaired fasting glucose: Secondary | ICD-10-CM | POA: Diagnosis not present

## 2023-05-24 DIAGNOSIS — N201 Calculus of ureter: Secondary | ICD-10-CM

## 2023-05-24 DIAGNOSIS — Z7984 Long term (current) use of oral hypoglycemic drugs: Secondary | ICD-10-CM | POA: Diagnosis not present

## 2023-05-24 DIAGNOSIS — N136 Pyonephrosis: Principal | ICD-10-CM | POA: Diagnosis present

## 2023-05-24 DIAGNOSIS — N319 Neuromuscular dysfunction of bladder, unspecified: Secondary | ICD-10-CM | POA: Diagnosis present

## 2023-05-24 DIAGNOSIS — I959 Hypotension, unspecified: Secondary | ICD-10-CM | POA: Diagnosis present

## 2023-05-24 DIAGNOSIS — B9689 Other specified bacterial agents as the cause of diseases classified elsewhere: Secondary | ICD-10-CM | POA: Diagnosis present

## 2023-05-24 DIAGNOSIS — F418 Other specified anxiety disorders: Secondary | ICD-10-CM | POA: Diagnosis not present

## 2023-05-24 DIAGNOSIS — Z79899 Other long term (current) drug therapy: Secondary | ICD-10-CM

## 2023-05-24 DIAGNOSIS — B964 Proteus (mirabilis) (morganii) as the cause of diseases classified elsewhere: Secondary | ICD-10-CM | POA: Diagnosis present

## 2023-05-24 DIAGNOSIS — G8220 Paraplegia, unspecified: Secondary | ICD-10-CM | POA: Diagnosis present

## 2023-05-24 DIAGNOSIS — F39 Unspecified mood [affective] disorder: Secondary | ICD-10-CM | POA: Diagnosis present

## 2023-05-24 DIAGNOSIS — N178 Other acute kidney failure: Secondary | ICD-10-CM | POA: Diagnosis not present

## 2023-05-24 DIAGNOSIS — E119 Type 2 diabetes mellitus without complications: Secondary | ICD-10-CM | POA: Diagnosis present

## 2023-05-24 DIAGNOSIS — K921 Melena: Secondary | ICD-10-CM | POA: Diagnosis present

## 2023-05-24 DIAGNOSIS — Z5982 Transportation insecurity: Secondary | ICD-10-CM | POA: Diagnosis not present

## 2023-05-24 DIAGNOSIS — E875 Hyperkalemia: Secondary | ICD-10-CM | POA: Diagnosis present

## 2023-05-24 DIAGNOSIS — N202 Calculus of kidney with calculus of ureter: Secondary | ICD-10-CM | POA: Diagnosis not present

## 2023-05-24 DIAGNOSIS — N179 Acute kidney failure, unspecified: Secondary | ICD-10-CM | POA: Diagnosis present

## 2023-05-24 DIAGNOSIS — E871 Hypo-osmolality and hyponatremia: Secondary | ICD-10-CM | POA: Diagnosis present

## 2023-05-24 LAB — URINALYSIS, W/ REFLEX TO CULTURE (INFECTION SUSPECTED)
Bilirubin Urine: NEGATIVE
Glucose, UA: NEGATIVE mg/dL
Ketones, ur: NEGATIVE mg/dL
Nitrite: NEGATIVE
Protein, ur: 100 mg/dL — AB
RBC / HPF: >50 RBC/hpf (ref 0–5)
Specific Gravity, Urine: 1.009 (ref 1.005–1.030)
WBC, UA: >50 WBC/hpf (ref 0–5)
pH: 6 (ref 5.0–8.0)

## 2023-05-24 LAB — CBC WITH DIFFERENTIAL/PLATELET
Abs Immature Granulocytes: 0.13 10*3/uL — ABNORMAL HIGH (ref 0.00–0.07)
Basophils Absolute: 0 10*3/uL (ref 0.0–0.1)
Basophils Relative: 0 %
Eosinophils Absolute: 0 10*3/uL (ref 0.0–0.5)
Eosinophils Relative: 0 %
HCT: 34.6 % — ABNORMAL LOW (ref 39.0–52.0)
Hemoglobin: 10.6 g/dL — ABNORMAL LOW (ref 13.0–17.0)
Immature Granulocytes: 1 %
Lymphocytes Relative: 10 %
Lymphs Abs: 1.3 10*3/uL (ref 0.7–4.0)
MCH: 19.7 pg — ABNORMAL LOW (ref 26.0–34.0)
MCHC: 30.6 g/dL (ref 30.0–36.0)
MCV: 64.4 fL — ABNORMAL LOW (ref 80.0–100.0)
Monocytes Absolute: 0.4 10*3/uL (ref 0.1–1.0)
Monocytes Relative: 3 %
Neutro Abs: 11.1 10*3/uL — ABNORMAL HIGH (ref 1.7–7.7)
Neutrophils Relative %: 86 %
Platelets: 563 10*3/uL — ABNORMAL HIGH (ref 150–400)
RBC: 5.37 MIL/uL (ref 4.22–5.81)
RDW: 18.8 % — ABNORMAL HIGH (ref 11.5–15.5)
Smear Review: INCREASED
WBC: 13 10*3/uL — ABNORMAL HIGH (ref 4.0–10.5)
nRBC: 0 % (ref 0.0–0.2)

## 2023-05-24 LAB — I-STAT VENOUS BLOOD GAS, ED
Acid-base deficit: 16 mmol/L — ABNORMAL HIGH (ref 0.0–2.0)
Bicarbonate: 13.7 mmol/L — ABNORMAL LOW (ref 20.0–28.0)
Calcium, Ion: 1.2 mmol/L (ref 1.15–1.40)
HCT: 38 % — ABNORMAL LOW (ref 39.0–52.0)
Hemoglobin: 12.9 g/dL — ABNORMAL LOW (ref 13.0–17.0)
O2 Saturation: 80 %
Potassium: 5.3 mmol/L — ABNORMAL HIGH (ref 3.5–5.1)
Sodium: 132 mmol/L — ABNORMAL LOW (ref 135–145)
TCO2: 15 mmol/L — ABNORMAL LOW (ref 22–32)
pCO2, Ven: 44.9 mmHg (ref 44–60)
pH, Ven: 7.091 — CL (ref 7.25–7.43)
pO2, Ven: 60 mmHg — ABNORMAL HIGH (ref 32–45)

## 2023-05-24 LAB — COMPREHENSIVE METABOLIC PANEL
ALT: 13 U/L (ref 0–44)
AST: 10 U/L — ABNORMAL LOW (ref 15–41)
Albumin: 3.2 g/dL — ABNORMAL LOW (ref 3.5–5.0)
Alkaline Phosphatase: 81 U/L (ref 38–126)
Anion gap: 18 — ABNORMAL HIGH (ref 5–15)
BUN: 123 mg/dL — ABNORMAL HIGH (ref 6–20)
CO2: 12 mmol/L — ABNORMAL LOW (ref 22–32)
Calcium: 9.3 mg/dL (ref 8.9–10.3)
Chloride: 100 mmol/L (ref 98–111)
Creatinine, Ser: 11.16 mg/dL — ABNORMAL HIGH (ref 0.61–1.24)
GFR, Estimated: 5 mL/min — ABNORMAL LOW (ref >60)
Glucose, Bld: 87 mg/dL (ref 70–99)
Potassium: 5.3 mmol/L — ABNORMAL HIGH (ref 3.5–5.1)
Sodium: 130 mmol/L — ABNORMAL LOW (ref 135–145)
Total Bilirubin: 0.3 mg/dL (ref 0.3–1.2)
Total Protein: 9.1 g/dL — ABNORMAL HIGH (ref 6.5–8.1)

## 2023-05-24 LAB — PROTIME-INR
INR: 1.2 (ref 0.8–1.2)
Prothrombin Time: 15.5 seconds — ABNORMAL HIGH (ref 11.4–15.2)

## 2023-05-24 LAB — LACTIC ACID, PLASMA: Lactic Acid, Venous: 1.2 mmol/L (ref 0.5–1.9)

## 2023-05-24 MED ORDER — SODIUM ZIRCONIUM CYCLOSILICATE 10 G PO PACK
10.0000 g | PACK | Freq: Every day | ORAL | Status: DC
  Administered 2023-05-24 – 2023-05-25 (×2): 10 g via ORAL
  Filled 2023-05-24 (×2): qty 1

## 2023-05-24 MED ORDER — LACTATED RINGERS IV BOLUS
1000.0000 mL | Freq: Once | INTRAVENOUS | Status: AC
  Administered 2023-05-24: 1000 mL via INTRAVENOUS

## 2023-05-24 MED ORDER — ONDANSETRON HCL 4 MG/2ML IJ SOLN
4.0000 mg | Freq: Once | INTRAMUSCULAR | Status: AC
  Administered 2023-05-24: 4 mg via INTRAVENOUS
  Filled 2023-05-24: qty 2

## 2023-05-24 MED ORDER — SODIUM CHLORIDE 0.9 % IV SOLN
2.0000 g | Freq: Once | INTRAVENOUS | Status: AC
  Administered 2023-05-24: 2 g via INTRAVENOUS
  Filled 2023-05-24: qty 20

## 2023-05-24 MED ORDER — STERILE WATER FOR INJECTION IV SOLN
INTRAVENOUS | Status: DC
  Filled 2023-05-24 (×2): qty 1000
  Filled 2023-05-24: qty 150
  Filled 2023-05-24 (×2): qty 1000
  Filled 2023-05-24: qty 150

## 2023-05-24 MED ORDER — ONDANSETRON 4 MG PO TBDP: 4.0000 mg | ORAL_TABLET | Freq: Once | ORAL | Status: DC

## 2023-05-24 NOTE — ED Notes (Signed)
Patient transported to CT 

## 2023-05-24 NOTE — ED Triage Notes (Signed)
Pt c/o RLQ pain intermittent with N/V/D and chills x11 days. States vomit x1 today after drinking water. Denies taking any meds.

## 2023-05-24 NOTE — ED Provider Notes (Addendum)
[facility]    CSN: [id number] Arrival date & time: 05/24/23  1444      History   Chief Complaint Chief Complaint  Patient presents with   Abdominal Pain    HPI Andre Clements is a 52 y.o. male.   Patient presents to clinic for complaints of chills, emesis and decreased appetite since Sunday, May 26.  He was feeling constipated so he took some MiraLAX and had diarrhea on Monday, reports his last dose of MiraLAX has been on Tuesday, diarrhea has persisted.  He has had multiple episodes per day and his last episode was earlier today.  Reports his stool has been quite dark.  Does not feel like he has passed all of his stool.  He has had a subjective fever, last felt on Thursday.  Reports multiple episodes of emesis, denies any blood, reports it is watery.  Denies recent sick contacts, camping, or polluted water exposure.  Caregiver with patient reports overall diminished appetite and fatigue.  Denies ETOH or drug use.     The history is provided by the patient and medical records.  Abdominal Pain Associated symptoms: chills, constipation, diarrhea, fatigue, nausea and vomiting     Past Medical History:  Diagnosis Date   Anxiety    Bipolar 2 disorder (HCC)    Blood in stool    Depression    History of spinal cord injury     form accident,paralysis of BLE   Hyperlipidemia    Hypertension    Urine incontinence     Patient Active Problem List   Diagnosis Date Noted   Right knee pain 01/29/2019   Hair loss 01/29/2019   Impaired fasting blood sugar 09/26/2017   Routine general medical examination at a health care facility 06/24/2016   Ventral hernia 05/26/2016   Pituitary insufficiency (HCC) 03/04/2016   Hypogonadism male 03/01/2016   Essential hypertension 09/16/2015   Hyperlipidemia 09/16/2015   Paralysis of both lower limbs (HCC) 09/16/2015   Mood disorder (HCC) 09/16/2015    Past Surgical History:  Procedure Laterality Date   ABDOMINAL HERNIA  REPAIR  2003   arm surgery Right    from suicide attempt   CYSTOSCOPY WITH LITHOLAPAXY N/A 08/26/2018   Procedure: CYSTOSCOPY WITH LITHOLAPAXY;  Surgeon: Rene Paci, MD;  Location: [facility];  Service: Urology;  Laterality: N/A;       Home Medications    Prior to Admission medications   Medication Sig Start Date End Date Taking? Authorizing Provider  Ascorbic Acid (VITAMIN C) 250 MG CHEW Chew 500 each by mouth daily.     [provider]  benztropine (COGENTIN) 0.5 MG tablet Take 0.5 mg by mouth daily. 03/25/23   [provider]  Cholecalciferol (VITAMIN D3) 5000 units CAPS Take 5,000 Units by mouth daily.     [provider]  FARXIGA 5 MG TABS tablet Take 5 mg by mouth daily. 03/03/23   [provider]  ferrous sulfate 325 (65 FE) MG tablet Take 325 mg by mouth daily.     [provider]  finasteride (PROSCAR) 5 MG tablet Take 1 tablet (5 mg total) by mouth daily. 04/27/21   Myrlene Broker, MD  haloperidol (HALDOL) 5 MG tablet Take 1 tablet (5 mg total) by mouth daily. 04/27/21   Myrlene Broker, MD  lisinopril (ZESTRIL) 40 MG tablet Take 1 tablet (40 mg total) by mouth daily. 04/27/21   Myrlene Broker, MD  metFORMIN (GLUCOPHAGE-XR) 500 MG 24 hr  tablet Take 1 tablet (500 mg total) by mouth daily with breakfast. 04/27/21   Myrlene Broker, MD  Multiple Vitamins-Minerals (CENTRUM VITAMINTS PO) Take 1 tablet by mouth daily.    [provider]  omeprazole (PRILOSEC) 40 MG capsule Take 1 capsule (40 mg total) by mouth daily. 04/27/21   Myrlene Broker, MD  sertraline (ZOLOFT) 100 MG tablet Take 2 tablets (200 mg total) by mouth daily. 04/27/21   Myrlene Broker, MD  simvastatin (ZOCOR) 40 MG tablet Take 1 tablet (40 mg total) by mouth daily at 6 PM. 04/27/21   Myrlene Broker, MD    Family History Family History  Problem Relation Age of Onset   Alcohol abuse Mother    Hypertension Mother     Alcohol abuse Father    Other Neg Hx        hypogonadism   Stomach cancer Neg Hx    Colon cancer Neg Hx    Esophageal cancer Neg Hx     Social History Social History   Tobacco Use   Smoking status: Never   Smokeless tobacco: Never  Vaping Use   Vaping Use: Never used  Substance Use Topics   Alcohol use: No    Alcohol/week: 0.0 standard drinks of alcohol   Drug use: No     Allergies   Patient has no known allergies.   Review of Systems Review of Systems  Constitutional:  Positive for appetite change, chills and fatigue.  Gastrointestinal:  Positive for abdominal pain, constipation, diarrhea, nausea and vomiting.     Physical Exam Triage Vital Signs ED Triage Vitals [05/24/23 1554]  Enc Vitals Group     BP (!) 95/58     Pulse Rate 87     Resp 18     Temp 98.2 F (36.8 C)     Temp Source Oral     SpO2 98 %     Weight      Height      Head Circumference      Peak Flow      Pain Score 4     Pain Loc      Pain Edu?      Excl. in GC?    No data found.  Updated Vital Signs BP (!) 95/58 (BP Location: Right Arm)   Pulse 87   Temp 98.2 F (36.8 C) (Oral)   Resp 18   SpO2 98%   Visual Acuity Right Eye Distance:   Left Eye Distance:   Bilateral Distance:    Right Eye Near:   Left Eye Near:    Bilateral Near:     Physical Exam Vitals and nursing note reviewed.  Constitutional:      Appearance: He is well-developed.  HENT:     Head: Normocephalic and atraumatic.     Mouth/Throat:     Mouth: Mucous membranes are moist.  Eyes:     Extraocular Movements: Extraocular movements intact.  Cardiovascular:     Rate and Rhythm: Normal rate and regular rhythm.     Heart sounds: Normal heart sounds. No murmur heard. Pulmonary:     Effort: Pulmonary effort is normal. No respiratory distress.     Breath sounds: Normal breath sounds.  Abdominal:     General: Abdomen is flat. Bowel sounds are decreased.     Palpations: Abdomen is soft.     Tenderness:  There is no abdominal tenderness.     Hernia: No hernia is present.  Skin:  General: Skin is warm and dry.  Neurological:     General: No focal deficit present.     Mental Status: He is alert and oriented to person, place, and time.  Psychiatric:        Mood and Affect: Mood normal.        Behavior: Behavior normal.      UC Treatments / Results  Labs (all labs ordered are listed, but only abnormal results are displayed) Labs Reviewed - No data to display  EKG   Radiology No results found.  Procedures Procedures (including critical care time)  Medications Ordered in UC Medications - No data to display  Initial Impression / Assessment and Plan / UC Course  I have reviewed the triage vital signs and the nursing notes.  Pertinent labs & imaging results that were available during my care of the patient were reviewed by me and considered in my medical decision making (see chart for details).  Vitals in triage reviewed.  Patient with soft and nontender abdomen with markedly diminished bowel sounds in all quadrants.  No palpable hernia or masses.  Initially symptoms started with fever, fatigue, nausea, constipation and vomiting and progressed to diarrhea with emesis, ongoing for the past 11 days.  Patient is wheelchair-bound and unable to place himself on our x-ray table at the urgent care.  Advised to head to the nearest emergency department for further evaluation and potential imaging, concern for obstruction versus other acute infectious pathology d/t duration of diarrhea.  Patient will head to the nearest emergency department via motorized wheelchair.     Final Clinical Impressions(s) / UC Diagnoses   Final diagnoses:  Generalized abdominal pain  Diarrhea, unspecified type   Discharge Instructions   None    ED Prescriptions   None    PDMP not reviewed this encounter.   Karuna Balducci, [place] N, FNP 05/24/23 1631    Sosha Shepherd, [place] N, [place] 05/24/23 1723

## 2023-05-24 NOTE — ED Notes (Signed)
Bladder scan resulted at .

## 2023-05-24 NOTE — ED Notes (Signed)
Phlebotomy attempted blood draw. Blood draw was unsuccessful.

## 2023-05-24 NOTE — Consult Note (Signed)
Nephrology Consult   Assessment/Recommendations:  AKI -baseline Cr unknown. Last known Cr 0.79 in 2021 -suspecting AKI is related to hypotension, prolonged prerenal injury (N/V/D) with concomitant Farxiga, Lisinopril, NSAID use -UA and CT pending -keep NPO after midnight just in case he does not respond to medical therapy and we need to consider temporary RRT -s/p 2L of LR, would c/w bicarb gtt 150cc/hr, bolus PRN with LR (depending on BP) -Avoid nephrotoxic medications including NSAIDs and iodinated intravenous contrast exposure unless the latter is absolutely indicated.  Preferred narcotic agents for pain control are hydromorphone, fentanyl, and methadone. Morphine should not be used. Avoid Baclofen and avoid oral sodium phosphate and magnesium citrate based laxatives / bowel preps. Continue strict Input and Output monitoring. Will monitor the patient closely with you and intervene or adjust therapy as indicated by changes in clinical status/labs   Metabolic acidemia -on bicarb gtt -lactate WNL  Hypertension -soft Bps here in ER, hold anti-HTNs for now  N/V/D -w/u per primary  Hematuria -imaging and UA pending -he does report a a history of a 'large bladder stone' in 2021 -will confirm hematuria with UA and if needed can send serologies for completion's sake  Hyperkalemia -K 5.3 -will start lokelma 10g daily -recommend renal diet for now  Hyponatremia -mild -likely secondary to AKI  Anemia -transfuse PRN  Anthony Sar [facility] 05/24/2023 7:54 PM   _____________________________________________________________________________________   History of Present Illness: Andre Clements is a/an 52 y.o. 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 Apparently has been having chronic dark  bloody stools and is following with GI for this. However, there has been reports of hematuria. Found to have a Cr of 11.16, BUN 123, K 5.3, Na 130, Hgb 10.6 (repeat 12.9), WBC 13, pH 7.09. s/p 2L LR, started on bicarb gtt after discussing with ER. Patient seen and examined in ER. He reports that his symptoms have been occurring since [facility]. Has been taking Aleve daily for chills/subjective fevers. He does report intermittent blood tinged urine. He does report having a large bladder stone in 2021 which needed intervention but is not clear on details. He reports that he feels much better after receiving fluids. Denies any diminished urinary frequency, dysuria, CP, SOB, swelling, hemoptysis, epistaxis, rash.   Medications:  Current Facility-Administered Medications  Medication Dose Route Frequency Provider Last Rate Last Admin   0.9 %  sodium chloride infusion  500 mL Intravenous Continuous Imogene Burn, MD       sodium bicarbonate 150 mEq in sterile water 1,150 mL infusion   Intravenous Continuous Pricilla Loveless, MD       Current Outpatient Medications  Medication Sig Dispense Refill   Ascorbic Acid (VITAMIN C) 250 MG CHEW Chew 500 each by mouth daily.      benztropine (COGENTIN) 0.5 MG tablet Take 0.5 mg by mouth daily.     Cholecalciferol (VITAMIN D3) 5000 units CAPS Take 5,000 Units by mouth daily.      FARXIGA 5 MG TABS tablet Take 5 mg by mouth daily.     ferrous sulfate 325 (65 FE) MG tablet Take 325 mg by mouth daily.      finasteride (PROSCAR) 5 MG tablet Take 1 tablet (5 mg total) by mouth daily. 90 tablet 3   haloperidol (HALDOL) 5 MG tablet Take 1 tablet (5  mg total) by mouth daily. 90 tablet 3   lisinopril (ZESTRIL) 40 MG tablet Take 1 tablet (40 mg total) by mouth daily. 90 tablet 3   metFORMIN (GLUCOPHAGE-XR) 500 MG 24 hr tablet Take 1 tablet (500 mg total) by mouth daily with breakfast. 90 tablet 3   Multiple Vitamins-Minerals (CENTRUM VITAMINTS PO) Take 1 tablet by mouth  daily.     omeprazole (PRILOSEC) 40 MG capsule Take 1 capsule (40 mg total) by mouth daily. 90 capsule 3   sertraline (ZOLOFT) 100 MG tablet Take 2 tablets (200 mg total) by mouth daily. 180 tablet 3   simvastatin (ZOCOR) 40 MG tablet Take 1 tablet (40 mg total) by mouth daily at 6 PM. 90 tablet 3     ALLERGIES Patient has no known allergies.  MEDICAL HISTORY Past Medical History:  Diagnosis Date   Anxiety    Bipolar 2 disorder (HCC)    Blood in stool    Depression    History of spinal cord injury     form accident,paralysis of BLE   Hyperlipidemia    Hypertension    Urine incontinence      SOCIAL HISTORY Social History   Socioeconomic History   Marital status: Single    Spouse name: Not on file   Number of children: 0   Years of education: Not on file   Highest education level: Not on file  Occupational History   Occupation: disability   Occupation: retired  Tobacco Use   Smoking status: Never   Smokeless tobacco: Never  Vaping Use   Vaping Use: Never used  Substance and Sexual Activity   Alcohol use: No    Alcohol/week: 0.0 standard drinks of alcohol   Drug use: No   Sexual activity: Not Currently  Other Topics Concern   Not on file  Social History Narrative   Not on file   Social Determinants of Health   Financial Resource Strain: Low Risk  (10/28/2020)   Overall Financial Resource Strain (CARDIA)    Difficulty of Paying Living Expenses: Not hard at all  Food Insecurity: No Food Insecurity (10/28/2020)   Hunger Vital Sign    Worried About Running Out of Food in the Last Year: Never true    Ran Out of Food in the Last Year: Never true  Transportation Needs: Unmet Transportation Needs (10/28/2020)   PRAPARE - Transportation    Lack of Transportation (Medical): Yes    Lack of Transportation (Non-Medical): Yes  Physical Activity: Inactive (10/28/2020)   Exercise Vital Sign    Days of Exercise per Week: 0 days    Minutes of Exercise per Session: 0 min   Stress: No Stress Concern Present (10/28/2020)   [employer] of Occupational Health - Occupational Stress Questionnaire    Feeling of Stress : Not at all  Social Connections: Unknown (10/28/2020)   Social Connection and Isolation Panel [NHANES]    Frequency of Communication with Friends and Family: More than three times a week    Frequency of Social Gatherings with Friends and Family: More than three times a week    Attends Religious Services: Patient declined    Database administrator or Organizations: Patient declined    Attends Banker Meetings: Patient declined    Marital Status: Never married  Catering manager Violence: Not on file     FAMILY HISTORY Family History  Problem Relation Age of Onset   Alcohol abuse Mother    Hypertension Mother    Alcohol  abuse Father    Other Neg Hx        hypogonadism   Stomach cancer Neg Hx    Colon cancer Neg Hx    Esophageal cancer Neg Hx      Review of Systems: 12 systems reviewed Otherwise as per HPI, all other systems reviewed and negative  Physical Exam: Vitals:   05/24/23 1915 05/24/23 1921  BP: (!) 100/49   Pulse:  (!) 59  Resp: 20 17  Temp:    SpO2:  97%   No intake/output data recorded. No intake or output data in the 24 hours ending 05/24/23 1954 General: well-appearing, no acute distress HEENT: anicteric sclera, oropharynx clear without lesions, dry mucosal membranes CV: regular rate, normal rhythm, no murmurs, no gallops, no rubs, no peripheral edema Lungs: clear to auscultation bilaterally, normal work of breathing Abd: soft, non-tender, non-distended Skin: no visible lesions or rashes Psych: alert, engaged, appropriate mood and affect Musculoskeletal: no obvious deformities Neuro: normal speech, does not move b/l LEs, no myoclonic jerks observed  Test Results Reviewed Lab Results  Component Value Date   NA 132 (L) 05/24/2023   K 5.3 (H) 05/24/2023   CL 100 05/24/2023   CO2 12 (L)  05/24/2023   BUN 123 (H) 05/24/2023   CREATININE 11.16 (H) 05/24/2023   GFR 106.02 10/28/2020   CALCIUM 9.3 05/24/2023   ALBUMIN 3.2 (L) 05/24/2023     I have reviewed all relevant outside healthcare records related to the patient's kidney injury.

## 2023-05-24 NOTE — ED Notes (Signed)
Patient is being discharged from the Urgent Care and sent to the Emergency Department via POV . Per [place], NP, patient is in need of higher level of care due to further evaluation. Patient is aware and verbalizes understanding of plan of care.  Vitals:   05/24/23 1554  BP: (!) 95/58  Pulse: 87  Resp: 18  Temp: 98.2 F (36.8 C)  SpO2: 98%

## 2023-05-24 NOTE — ED Provider Notes (Signed)
[facility] EMERGENCY DEPARTMENT AT [facility] Provider Note   CSN: [id number] Arrival date & time: 05/24/23  4098     History  Chief Complaint  Patient presents with   Hypotension   Nausea   Emesis   Abdominal Pain    Andre Clements is a 52 y.o. male.  HPI 52 year old male presents with vomiting and diarrhea. Has also had constipation. Was sent over from urgent care. Has a history of a spinal cord injury with leg paralysis, HTN, HLD, and Bipolar disorder. He has had about 10 days of diarrhea, as well as some vomiting. Has had hematuria but no dysuria. Has had chills but has not checked his temperature. Has had RLQ abdominal pain the whole time as well. Has some dark and bloody stools. The bloody stools have been for a while, is following up with GI. The hematuria is new.   Home Medications Prior to Admission medications   Medication Sig Start Date End Date Taking? Authorizing Provider  Ascorbic Acid (VITAMIN C) 250 MG CHEW Chew 500 each by mouth daily.     [provider]  benztropine (COGENTIN) 0.5 MG tablet Take 0.5 mg by mouth daily. 03/25/23   [provider]  Cholecalciferol (VITAMIN D3) 5000 units CAPS Take 5,000 Units by mouth daily.     [provider]  FARXIGA 5 MG TABS tablet Take 5 mg by mouth daily. 03/03/23   [provider]  ferrous sulfate 325 (65 FE) MG tablet Take 325 mg by mouth daily.     [provider]  finasteride (PROSCAR) 5 MG tablet Take 1 tablet (5 mg total) by mouth daily. 04/27/21   Myrlene Broker, MD  haloperidol (HALDOL) 5 MG tablet Take 1 tablet (5 mg total) by mouth daily. 04/27/21   Myrlene Broker, MD  lisinopril (ZESTRIL) 40 MG tablet Take 1 tablet (40 mg total) by mouth daily. 04/27/21   Myrlene Broker, MD  metFORMIN (GLUCOPHAGE-XR) 500 MG 24 hr tablet Take 1 tablet (500 mg total) by mouth daily with breakfast. 04/27/21   Myrlene Broker, MD  Multiple Vitamins-Minerals  (CENTRUM VITAMINTS PO) Take 1 tablet by mouth daily.    [provider]  omeprazole (PRILOSEC) 40 MG capsule Take 1 capsule (40 mg total) by mouth daily. 04/27/21   Myrlene Broker, MD  sertraline (ZOLOFT) 100 MG tablet Take 2 tablets (200 mg total) by mouth daily. 04/27/21   Myrlene Broker, MD  simvastatin (ZOCOR) 40 MG tablet Take 1 tablet (40 mg total) by mouth daily at 6 PM. 04/27/21   Myrlene Broker, MD      Allergies    Patient has no known allergies.    Review of Systems   Review of Systems  Constitutional:  Positive for chills.  Respiratory:  Negative for cough and shortness of breath.   Gastrointestinal:  Positive for abdominal pain, blood in stool, constipation, diarrhea, nausea and vomiting.  Genitourinary:  Positive for hematuria. Negative for dysuria.  Neurological:  Negative for light-headedness.    Physical Exam Updated Vital Signs BP 122/63   Pulse 73   Temp 98.3 F (36.8 C) (Oral)   Resp 17   Ht 5\' 8"  (1.727 m)   Wt 99.8 kg   SpO2 100%   BMI 33.45 kg/m  Physical Exam Vitals and nursing note reviewed.  Constitutional:      General: He is not in acute distress.    Appearance: He is well-developed.  He is not ill-appearing or diaphoretic.  HENT:     Head: Normocephalic and atraumatic.  Cardiovascular:     Rate and Rhythm: Normal rate and regular rhythm.     Heart sounds: Normal heart sounds.  Pulmonary:     Effort: Pulmonary effort is normal.     Breath sounds: Normal breath sounds.  Abdominal:     Palpations: Abdomen is soft.     Tenderness: There is abdominal tenderness in the right lower quadrant.  Skin:    General: Skin is warm and dry.  Neurological:     Mental Status: He is alert.     ED Results / Procedures / Treatments   Labs (all labs ordered are listed, but only abnormal results are displayed) Labs Reviewed  COMPREHENSIVE METABOLIC PANEL - Abnormal; Notable for the following components:      Result Value    Sodium 130 (*)    Potassium 5.3 (*)    CO2 12 (*)    BUN 123 (*)    Creatinine, Ser 11.16 (*)    Total Protein 9.1 (*)    Albumin 3.2 (*)    AST 10 (*)    GFR, Estimated 5 (*)    Anion gap 18 (*)    All other components within normal limits  CBC WITH DIFFERENTIAL/PLATELET - Abnormal; Notable for the following components:   WBC 13.0 (*)    Hemoglobin 10.6 (*)    HCT 34.6 (*)    MCV 64.4 (*)    MCH 19.7 (*)    RDW 18.8 (*)    Platelets 563 (*)    Neutro Abs 11.1 (*)    Abs Immature Granulocytes 0.13 (*)    All other components within normal limits  PROTIME-INR - Abnormal; Notable for the following components:   Prothrombin Time 15.5 (*)    All other components within normal limits  I-STAT VENOUS BLOOD GAS, ED - Abnormal; Notable for the following components:   pH, Ven 7.091 (*)    pO2, Ven 60 (*)    Bicarbonate 13.7 (*)    TCO2 15 (*)    Acid-base deficit 16.0 (*)    Sodium 132 (*)    Potassium 5.3 (*)    HCT 38.0 (*)    Hemoglobin 12.9 (*)    All other components within normal limits  CULTURE, BLOOD (ROUTINE X 2)  CULTURE, BLOOD (ROUTINE X 2)  GASTROINTESTINAL PANEL BY PCR, STOOL (REPLACES STOOL CULTURE)  LACTIC ACID, PLASMA  URINALYSIS, W/ REFLEX TO CULTURE (INFECTION SUSPECTED)    EKG EKG Interpretation  Date/Time:  Wednesday May 24 2023 18:53:34 EDT Ventricular Rate:  77 PR Interval:  178 QRS Duration: 100 QT Interval:  432 QTC Calculation: 489 R Axis:   12 Text Interpretation: Sinus rhythm Borderline prolonged QT interval No old tracing to compare Confirmed by Pricilla Loveless [phone]) on 05/24/2023 7:46:26 PM  Radiology CT ABDOMEN PELVIS WO CONTRAST  Result Date: 05/24/2023 CLINICAL DATA:  Right lower quadrant abdominal pain, hypotension, nausea, emesis. EXAM: CT ABDOMEN AND PELVIS WITHOUT CONTRAST TECHNIQUE: Multidetector CT imaging of the abdomen and pelvis was performed following the standard protocol without IV contrast. RADIATION DOSE REDUCTION: This  exam was performed according to the departmental dose-optimization program which includes automated exposure control, adjustment of the mA and/or kV according to patient size and/or use of iterative reconstruction technique. COMPARISON:  01/06/2020. FINDINGS: Lower chest: A stable 3 mm subpleural nodule is noted in the left lower lobe, axial image 18, likely benign. Mild  atelectasis is present at the lung bases. Hepatobiliary: No focal liver abnormality is seen. No gallstones, gallbladder wall thickening, or biliary dilatation. Pancreas: Unremarkable. No pancreatic ductal dilatation or surrounding inflammatory changes. Spleen: Normal in size without focal abnormality. Adrenals/Urinary Tract: The adrenal glands are within normal limits. Cyst is noted in the lower pole of the right kidney. Renal calculi are present bilaterally. 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 There is moderate hydroureteronephrosis on the right with a 1 cm calculus in the distal right ureter in the pelvis. No obstructive uropathy on the left. Mild perinephric fat stranding is seen bilaterally and periureteral fat stranding is noted on the right. The bladder is within normal limits. Stomach/Bowel: Stomach is within normal limits. Appendix appears normal. No evidence of bowel wall thickening, distention, or inflammatory changes. No free air or pneumatosis. A few scattered diverticula are present along the colon without evidence of diverticulitis. Vascular/Lymphatic: The aorta is normal in caliber. An IVC filter is noted. A few prominent lymph nodes are noted in the upper abdomen at the level of the kidneys and may be reactive. Reproductive: Not seen due to limited field of view. Other: No abdominopelvic ascites. A small fat containing umbilical hernia is present. Musculoskeletal: Degenerative changes are present in the thoracolumbar spine. Spinal fusion hardware is noted L2. No acute osseous abnormality IMPRESSION: 1. Moderate obstructive  uropathy on the right with a 1 cm calculus in the distal right ureter in the pelvis. 2. 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 There is perinephric fat stranding bilaterally and along the proximal right ureter which may be infectious or inflammatory. 3. Remaining incidental findings as described above. Electronically Signed   By: Thornell Sartorius M.D.   On: 05/24/2023 22:00   DG Chest 2 View  Result Date: 05/24/2023 CLINICAL DATA:  Suspected sepsis. Nausea vomiting and diarrhea with right lower quadrant pain EXAM: CHEST - 2 VIEW COMPARISON:  X-ray 08/25/2018 FINDINGS: No consolidation, pneumothorax or effusion. Normal cardiopericardial silhouette without edema. Film is under penetrated. Fixation hardware along the lumbar spine at the edge of the imaging field. IMPRESSION: No acute cardiopulmonary disease Electronically Signed   By: Karen Kays M.D.   On: 05/24/2023 17:49    Procedures .Critical Care  Performed by: Pricilla Loveless, MD Authorized by: Pricilla Loveless, MD   Critical care provider statement:    Critical care time (minutes):  60   Critical care time was exclusive of:  Separately billable procedures and treating other patients   Critical care was necessary to treat or prevent imminent or life-threatening deterioration of the following conditions:  Metabolic crisis and renal failure   Critical care was time spent personally by me on the following activities:  Development of treatment plan with patient or surrogate, discussions with consultants, evaluation of patient's response to treatment, examination of patient, ordering and review of laboratory studies, ordering and review of radiographic studies, ordering and performing treatments and interventions, pulse oximetry, re-evaluation of patient's condition and review of old charts     Medications Ordered in ED Medications  sodium bicarbonate 150 mEq in sterile water 1,150 mL infusion ( Intravenous New Bag/Given 05/24/23  2012)  sodium zirconium cyclosilicate (LOKELMA) packet 10 g (10 g Oral Given 05/24/23 2201)  lactated ringers bolus 1,000 mL (1,000 mLs Intravenous New Bag/Given 05/24/23 1814)  ondansetron (ZOFRAN) injection 4 mg (4 mg Intravenous Given 05/24/23 1816)  lactated ringers bolus 1,000 mL (1,000 mLs Intravenous New Bag/Given 05/24/23 1916)    ED Course/ Medical  Decision Making/ A&P Clinical Course as of 05/24/23 2247  Wed May 24, 2023  1939 Dr Thedore Mins recommends Bicarb drip, 150/hr, and npo after midnight in case he needs dialysis. Imaging to help r/o obstruction. Nephrology will follow. [SG]  2225 Discussed CT results with urology, Dr. Laverle Patter. Advises hospitalist admission to [facility]. Has better OR capability for a big stone like this.  Thinks the renal failure would be reversible with stenting.  Needs urgent stent.  Does not need to go ED to ED but be admitted and if the hospitalist team can let him know when he gets there.  Otherwise as long as he is not septic this can be done urgently as opposed to emergently.  Keep NPO. [SG]    Clinical Course User Index [SG] Pricilla Loveless, MD                             Medical Decision Making Amount and/or Complexity of Data Reviewed Labs: ordered.    Details: Mild leukocytosis.  Severe renal failure with a creatinine of 11, most recent from 2021 was normal.  Mild hyperkalemia.  Normal lactate.  Metabolic acidosis. Radiology: ordered and independent interpretation performed.    Details: Right ureteral stone and hydronephrosis. ECG/medicine tests: ordered and independent interpretation performed.    Details: No findings from hyperkalemia  Risk Prescription drug management. Decision regarding hospitalization.   As above, nephrology has put in recommendations.  However CT is concerning for obstructive uropathy, definitely could be contributing to his kidney injury.  Discussed with Dr. Laverle Patter as above and he recommends transfer to [facility] for admission.  He  was started on 2 L IV fluids and then put on the bicarbonate drip.  He is no longer hypotensive.  He is not ill-appearing or septic.  Discussed with Julian Reil for admission.        Final Clinical Impression(s) / ED Diagnoses Final diagnoses:  Acute kidney injury [facility])  Right ureteral stone    Rx / DC Orders ED Discharge Orders     None         Pricilla Loveless, MD 05/24/23 2332

## 2023-05-24 NOTE — ED Triage Notes (Addendum)
Pt seen at [facility] and told to come to the ED. Pt reports nausea, vomiting, diarrhea, right lower quadrant pain and chills for 11 days. Pt is AxO4. He is hypotensive during triage.

## 2023-05-25 ENCOUNTER — Inpatient Hospital Stay (HOSPITAL_COMMUNITY): Payer: Medicare Other | Admitting: Certified Registered Nurse Anesthetist

## 2023-05-25 ENCOUNTER — Inpatient Hospital Stay (HOSPITAL_COMMUNITY): Payer: Medicare Other

## 2023-05-25 ENCOUNTER — Encounter (HOSPITAL_COMMUNITY)
Admission: EM | Disposition: A | Discharge status: Home / Self Care | Payer: Self-pay | Source: Home / Self Care | Attending: Internal Medicine

## 2023-05-25 DIAGNOSIS — N202 Calculus of kidney with calculus of ureter: Secondary | ICD-10-CM

## 2023-05-25 DIAGNOSIS — F418 Other specified anxiety disorders: Secondary | ICD-10-CM

## 2023-05-25 DIAGNOSIS — N139 Obstructive and reflux uropathy, unspecified: Secondary | ICD-10-CM

## 2023-05-25 DIAGNOSIS — I10 Essential (primary) hypertension: Secondary | ICD-10-CM

## 2023-05-25 DIAGNOSIS — E230 Hypopituitarism: Secondary | ICD-10-CM

## 2023-05-25 DIAGNOSIS — N178 Other acute kidney failure: Secondary | ICD-10-CM

## 2023-05-25 DIAGNOSIS — R7301 Impaired fasting glucose: Secondary | ICD-10-CM

## 2023-05-25 DIAGNOSIS — N179 Acute kidney failure, unspecified: Secondary | ICD-10-CM

## 2023-05-25 DIAGNOSIS — G8220 Paraplegia, unspecified: Secondary | ICD-10-CM

## 2023-05-25 HISTORY — PX: CYSTOSCOPY WITH RETROGRADE PYELOGRAM, URETEROSCOPY AND STENT PLACEMENT: SHX5789

## 2023-05-25 LAB — BASIC METABOLIC PANEL
Anion gap: 13 (ref 5–15)
Anion gap: 17 — ABNORMAL HIGH (ref 5–15)
BUN: 107 mg/dL — ABNORMAL HIGH (ref 6–20)
BUN: 97 mg/dL — ABNORMAL HIGH (ref 6–20)
CO2: 18 mmol/L — ABNORMAL LOW (ref 22–32)
CO2: 17 mmol/L — ABNORMAL LOW (ref 22–32)
Calcium: 8.3 mg/dL — ABNORMAL LOW (ref 8.9–10.3)
Calcium: 7.9 mg/dL — ABNORMAL LOW (ref 8.9–10.3)
Chloride: 99 mmol/L (ref 98–111)
Chloride: 102 mmol/L (ref 98–111)
Creatinine, Ser: 8.94 mg/dL — ABNORMAL HIGH (ref 0.61–1.24)
Creatinine, Ser: 7.15 mg/dL — ABNORMAL HIGH (ref 0.61–1.24)
GFR, Estimated: 7 mL/min — ABNORMAL LOW (ref >60)
GFR, Estimated: 9 mL/min — ABNORMAL LOW (ref >60)
Glucose, Bld: 83 mg/dL (ref 70–99)
Glucose, Bld: 116 mg/dL — ABNORMAL HIGH (ref 70–99)
Potassium: 4.5 mmol/L (ref 3.5–5.1)
Potassium: 5.1 mmol/L (ref 3.5–5.1)
Sodium: 130 mmol/L — ABNORMAL LOW (ref 135–145)
Sodium: 136 mmol/L (ref 135–145)

## 2023-05-25 LAB — GLUCOSE, CAPILLARY
Glucose-Capillary: 70 mg/dL (ref 70–99)
Glucose-Capillary: 88 mg/dL (ref 70–99)
Glucose-Capillary: 82 mg/dL (ref 70–99)
Glucose-Capillary: 169 mg/dL — ABNORMAL HIGH (ref 70–99)
Glucose-Capillary: 103 mg/dL — ABNORMAL HIGH (ref 70–99)
Glucose-Capillary: 100 mg/dL — ABNORMAL HIGH (ref 70–99)

## 2023-05-25 LAB — CBC
HCT: 27.7 % — ABNORMAL LOW (ref 39.0–52.0)
Hemoglobin: 8.7 g/dL — ABNORMAL LOW (ref 13.0–17.0)
MCH: 20.2 pg — ABNORMAL LOW (ref 26.0–34.0)
MCHC: 31.4 g/dL (ref 30.0–36.0)
MCV: 64.4 fL — ABNORMAL LOW (ref 80.0–100.0)
Platelets: 496 10*3/uL — ABNORMAL HIGH (ref 150–400)
RBC: 4.3 MIL/uL (ref 4.22–5.81)
RDW: 17.6 % — ABNORMAL HIGH (ref 11.5–15.5)
WBC: 8.1 10*3/uL (ref 4.0–10.5)
nRBC: 0 % (ref 0.0–0.2)

## 2023-05-25 LAB — HIV ANTIBODY (ROUTINE TESTING W REFLEX): HIV Screen 4th Generation wRfx: NONREACTIVE

## 2023-05-25 LAB — MRSA NEXT GEN BY PCR, NASAL: MRSA by PCR Next Gen: NOT DETECTED

## 2023-05-25 LAB — URINE CULTURE

## 2023-05-25 SURGERY — CYSTOURETEROSCOPY, WITH RETROGRADE PYELOGRAM AND STENT INSERTION
Anesthesia: General | Laterality: Right

## 2023-05-25 MED ORDER — PROPOFOL 10 MG/ML IV BOLUS
INTRAVENOUS | Status: DC | PRN
  Administered 2023-05-25: 160 mg via INTRAVENOUS

## 2023-05-25 MED ORDER — EPHEDRINE SULFATE-NACL 50-0.9 MG/10ML-% IV SOSY
PREFILLED_SYRINGE | INTRAVENOUS | Status: DC | PRN
  Administered 2023-05-25: 5 mg via INTRAVENOUS

## 2023-05-25 MED ORDER — BENZTROPINE MESYLATE 0.5 MG PO TABS
0.5000 mg | ORAL_TABLET | Freq: Every day | ORAL | Status: DC
  Administered 2023-05-25 – 2023-05-28 (×4): 0.5 mg via ORAL
  Filled 2023-05-25 (×4): qty 1

## 2023-05-25 MED ORDER — PROPOFOL 10 MG/ML IV BOLUS
INTRAVENOUS | Status: AC
  Filled 2023-05-25: qty 20

## 2023-05-25 MED ORDER — PHENYLEPHRINE 80 MCG/ML (10ML) SYRINGE FOR IV PUSH (FOR BLOOD PRESSURE SUPPORT)
PREFILLED_SYRINGE | INTRAVENOUS | Status: DC | PRN
  Administered 2023-05-25: 160 ug via INTRAVENOUS
  Administered 2023-05-25: 80 ug via INTRAVENOUS
  Administered 2023-05-25: 160 ug via INTRAVENOUS

## 2023-05-25 MED ORDER — OXYCODONE HCL 5 MG PO TABS: 5.0000 mg | ORAL_TABLET | Freq: Once | ORAL | Status: DC | PRN

## 2023-05-25 MED ORDER — LIDOCAINE 2% (20 MG/ML) 5 ML SYRINGE
INTRAMUSCULAR | Status: DC | PRN
  Administered 2023-05-25: 100 mg via INTRAVENOUS

## 2023-05-25 MED ORDER — AMISULPRIDE (ANTIEMETIC) 5 MG/2ML IV SOLN: 10.0000 mg | Freq: Once | INTRAVENOUS | Status: DC | PRN

## 2023-05-25 MED ORDER — ACETAMINOPHEN 325 MG PO TABS: 650.0000 mg | ORAL_TABLET | Freq: Four times a day (QID) | ORAL | Status: DC | PRN

## 2023-05-25 MED ORDER — HALOPERIDOL 5 MG PO TABS
5.0000 mg | ORAL_TABLET | Freq: Every day | ORAL | Status: DC
  Administered 2023-05-25 – 2023-05-28 (×4): 5 mg via ORAL
  Filled 2023-05-25 (×2): qty 1
  Filled 2023-05-25 (×2): qty 5

## 2023-05-25 MED ORDER — ONDANSETRON HCL 4 MG/2ML IJ SOLN
INTRAMUSCULAR | Status: AC
  Filled 2023-05-25: qty 2

## 2023-05-25 MED ORDER — ACETAMINOPHEN 650 MG RE SUPP: 650.0000 mg | Freq: Four times a day (QID) | RECTAL | Status: DC | PRN

## 2023-05-25 MED ORDER — ALUM & MAG HYDROXIDE-SIMETH 200-200-20 MG/5ML PO SUSP
30.0000 mL | Freq: Four times a day (QID) | ORAL | Status: DC | PRN
  Administered 2023-05-25: 30 mL via ORAL
  Filled 2023-05-25: qty 30

## 2023-05-25 MED ORDER — SODIUM CHLORIDE 0.9 % IV SOLN: INTRAVENOUS | Status: DC

## 2023-05-25 MED ORDER — FENTANYL CITRATE (PF) 100 MCG/2ML IJ SOLN
INTRAMUSCULAR | Status: AC
  Filled 2023-05-25: qty 2

## 2023-05-25 MED ORDER — INSULIN ASPART 100 UNIT/ML IJ SOLN
0.0000 [IU] | INTRAMUSCULAR | Status: DC
  Administered 2023-05-25: 2 [IU] via SUBCUTANEOUS
  Administered 2023-05-26 – 2023-05-27 (×3): 1 [IU] via SUBCUTANEOUS

## 2023-05-25 MED ORDER — SODIUM CHLORIDE 0.9 % IR SOLN
Status: DC | PRN
  Administered 2023-05-25: 3000 mL

## 2023-05-25 MED ORDER — SODIUM CHLORIDE 0.9 % IV SOLN
1.0000 g | INTRAVENOUS | Status: DC
  Administered 2023-05-25 – 2023-05-27 (×3): 1 g via INTRAVENOUS
  Filled 2023-05-25 (×3): qty 10

## 2023-05-25 MED ORDER — ONDANSETRON HCL 4 MG/2ML IJ SOLN: 4.0000 mg | Freq: Once | INTRAMUSCULAR | Status: DC | PRN

## 2023-05-25 MED ORDER — ACETAMINOPHEN 10 MG/ML IV SOLN: 1000.0000 mg | Freq: Once | INTRAVENOUS | Status: DC | PRN

## 2023-05-25 MED ORDER — PANTOPRAZOLE SODIUM 40 MG PO TBEC
40.0000 mg | DELAYED_RELEASE_TABLET | Freq: Every day | ORAL | Status: DC
  Administered 2023-05-25 – 2023-05-28 (×4): 40 mg via ORAL
  Filled 2023-05-25 (×4): qty 1

## 2023-05-25 MED ORDER — DEXAMETHASONE SODIUM PHOSPHATE 4 MG/ML IJ SOLN
INTRAMUSCULAR | Status: DC | PRN
  Administered 2023-05-25: 5 mg via INTRAVENOUS

## 2023-05-25 MED ORDER — OXYCODONE HCL 5 MG/5ML PO SOLN: 5.0000 mg | Freq: Once | ORAL | Status: DC | PRN

## 2023-05-25 MED ORDER — FINASTERIDE 5 MG PO TABS
5.0000 mg | ORAL_TABLET | Freq: Every day | ORAL | Status: DC
  Administered 2023-05-25 – 2023-05-28 (×4): 5 mg via ORAL
  Filled 2023-05-25 (×4): qty 1

## 2023-05-25 MED ORDER — ONDANSETRON HCL 4 MG/2ML IJ SOLN
4.0000 mg | Freq: Four times a day (QID) | INTRAMUSCULAR | Status: DC | PRN
  Administered 2023-05-25: 4 mg via INTRAVENOUS

## 2023-05-25 MED ORDER — CHLORHEXIDINE GLUCONATE CLOTH 2 % EX PADS
6.0000 | MEDICATED_PAD | Freq: Every day | CUTANEOUS | Status: DC
  Administered 2023-05-25 – 2023-05-27 (×3): 6 via TOPICAL

## 2023-05-25 MED ORDER — HYDROMORPHONE HCL 1 MG/ML IJ SOLN: 0.2500 mg | INTRAMUSCULAR | Status: DC | PRN

## 2023-05-25 MED ORDER — DEXAMETHASONE SODIUM PHOSPHATE 10 MG/ML IJ SOLN
INTRAMUSCULAR | Status: AC
  Filled 2023-05-25: qty 1

## 2023-05-25 MED ORDER — SERTRALINE HCL 100 MG PO TABS
200.0000 mg | ORAL_TABLET | Freq: Every day | ORAL | Status: DC
  Administered 2023-05-25 – 2023-05-28 (×4): 200 mg via ORAL
  Filled 2023-05-25 (×4): qty 2

## 2023-05-25 MED ORDER — ONDANSETRON HCL 4 MG PO TABS: 4.0000 mg | ORAL_TABLET | Freq: Four times a day (QID) | ORAL | Status: DC | PRN

## 2023-05-25 MED ORDER — MIDAZOLAM HCL 5 MG/5ML IJ SOLN
INTRAMUSCULAR | Status: DC | PRN
  Administered 2023-05-25: 2 mg via INTRAVENOUS

## 2023-05-25 MED ORDER — SIMVASTATIN 40 MG PO TABS
40.0000 mg | ORAL_TABLET | Freq: Every day | ORAL | Status: DC
  Administered 2023-05-25 – 2023-05-27 (×3): 40 mg via ORAL
  Filled 2023-05-25 (×3): qty 1

## 2023-05-25 MED ORDER — MIDAZOLAM HCL 2 MG/2ML IJ SOLN
INTRAMUSCULAR | Status: AC
  Filled 2023-05-25: qty 2

## 2023-05-25 MED ORDER — IOHEXOL 300 MG/ML  SOLN
INTRAMUSCULAR | Status: DC | PRN
  Administered 2023-05-25: 13 mL

## 2023-05-25 MED ORDER — FENTANYL CITRATE (PF) 100 MCG/2ML IJ SOLN
INTRAMUSCULAR | Status: DC | PRN
  Administered 2023-05-25 (×3): 25 ug via INTRAVENOUS

## 2023-05-25 MED ORDER — ORAL CARE MOUTH RINSE: 15.0000 mL | OROMUCOSAL | Status: DC | PRN

## 2023-05-25 SURGICAL SUPPLY — 24 items
BAG COUNTER SPONGE SURGICOUNT (BAG) IMPLANT
BAG SPNG CNTER NS LX DISP (BAG)
BAG URO CATCHER STRL LF (MISCELLANEOUS) ×1 IMPLANT
BASKET ZERO TIP NITINOL 2.4FR (BASKET) IMPLANT
BSKT STON RTRVL ZERO TP 2.4FR (BASKET)
CATH URETL OPEN END 6FR 70 (CATHETERS) IMPLANT
CLOTH BEACON ORANGE TIMEOUT ST (SAFETY) ×1 IMPLANT
GLOVE SURG LX STRL 7.5 STRW (GLOVE) ×1 IMPLANT
GOWN STRL REUS W/ TWL XL LVL3 (GOWN DISPOSABLE) ×1 IMPLANT
GOWN STRL REUS W/TWL XL LVL3 (GOWN DISPOSABLE) ×1
GUIDEWIRE STR DUAL SENSOR (WIRE) ×1 IMPLANT
GUIDEWIRE ZIPWRE .038 STRAIGHT (WIRE) IMPLANT
IV NS 1000ML (IV SOLUTION) ×1
IV NS 1000ML BAXH (IV SOLUTION) ×1 IMPLANT
KIT TURNOVER KIT A (KITS) IMPLANT
LASER FIB FLEXIVA PULSE ID 365 (Laser) IMPLANT
MANIFOLD NEPTUNE II (INSTRUMENTS) ×1 IMPLANT
PACK CYSTO (CUSTOM PROCEDURE TRAY) ×1 IMPLANT
SHEATH NAVIGATOR HD 12/14X36 (SHEATH) IMPLANT
STENT URET 6FRX24 CONTOUR (STENTS) IMPLANT
TRACTIP FLEXIVA PULS ID 200XHI (Laser) IMPLANT
TRACTIP FLEXIVA PULSE ID 200 (Laser)
TUBING CONNECTING 10 (TUBING) ×1 IMPLANT
TUBING UROLOGY SET (TUBING) ×1 IMPLANT

## 2023-05-25 NOTE — Op Note (Signed)
Preoperative diagnosis:  Right ureteral calculus and acute kidney injury Left staghorn calculus  Postoperative diagnosis:  Right ureteral calculus and acute kidney injury Left renal calculus  Procedure:  Cystoscopy Right ureteral stent placement (6 x 24 - no string)  Right retrograde pyelography with interpretation  Right ureteroscopy  Surgeon: Moody Bruins. M.D.  Anesthesia: General  Complications: None  Intraoperative findings: Right retrograde pyelography was sent over the 6 French ureteral catheter and Omnipaque contrast.  This demonstrated some extravasation with this in the distal ureter with obstruction and mid ureter consistent with his known stone.  EBL: Minimal  Specimens: None  Indication: Andre Clements is a 52 y.o. patient with obstructing 1 cm right ureteral stone and acute kidney injury.  He also has a large left staghorn renal calculus with a probably poorly functional left kidney. After reviewing the management options for treatment, he elected to proceed with the above surgical procedure(s). We have discussed the potential benefits and risks of the procedure, side effects of the proposed treatment, the likelihood of the patient achieving the goals of the procedure, and any potential problems that might occur during the procedure or recuperation. Informed consent has been obtained.  Description of procedure:  The patient was taken to the operating room and general anesthesia was induced.  The patient was placed in the dorsal lithotomy position, prepped and draped in the usual sterile fashion, and preoperative antibiotics were administered. A preoperative time-out was performed.   Cystourethroscopy was performed.  The patient's urethra was examined and was normal. The bladder was then systematically examined in its entirety. There was no evidence for any bladder tumors, stones, or other mucosal pathology.    Attention then turned to the right ureteral orifice  and a ureteral catheter was used to intubate the ureteral orifice.  Omnipaque contrast was injected through the ureteral catheter and a retrograde pyelogram was performed with findings as dictated above.  An attempt was made to place a 0.38 sensor guidewire up the right ureter met resistance in the vicinity of the distal right ureter where there was extravasation.  I therefore performed semirigid ureteroscopy was able to identify the lumen.  A 0.38 sensor guidewire was then advanced up the right ureter into the renal pelvis under fluoroscopic guidance.  The wire was then backloaded through the cystoscope and a ureteral stent was advance over the wire using Seldinger technique.  The stent was positioned appropriately under fluoroscopic and cystoscopic guidance.  The wire was then removed with an adequate stent curl noted in the renal pelvis as well as in the bladder.  The bladder was then emptied and the procedure ended.  16 French Foley catheter was left in place.  The patient appeared to tolerate the procedure well and without complications.  The patient was able to be awakened and transferred to the recovery unit in satisfactory condition.    Moody Bruins MD

## 2023-05-25 NOTE — Anesthesia Postprocedure Evaluation (Signed)
Anesthesia Post Note  Patient: Andre Clements  Procedure(s) Performed: CYSTOSCOPY WITH RETROGRADE PYELOGRAM, URETEROSCOPY AND STENT PLACEMENT (Right)     Patient location during evaluation: Endoscopy Anesthesia Type: General Level of consciousness: awake and alert Pain management: pain level controlled Vital Signs Assessment: post-procedure vital signs reviewed and stable Respiratory status: spontaneous breathing, nonlabored ventilation, respiratory function stable and patient connected to nasal cannula oxygen Cardiovascular status: blood pressure returned to baseline and stable Postop Assessment: no apparent nausea or vomiting Anesthetic complications: no   No notable events documented.  Last Vitals:  Vitals:   05/25/23 0830 05/25/23 1200  BP: 108/62   Pulse: 78   Resp: 18   Temp:  36.6 C  SpO2: 94%     Last Pain:  Vitals:   05/25/23 1200  TempSrc: Oral  PainSc:                  Trevor Iha

## 2023-05-25 NOTE — Assessment & Plan Note (Addendum)
-  Chronic and baseline -Reports indwelling foley at baseline
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
## 2023-05-25 NOTE — Assessment & Plan Note (Addendum)
-  Will check A1c -hold Glucophage, Farxiga -Cover with sensitive-scale SSI

## 2023-05-25 NOTE — Transfer of Care (Signed)
Immediate Anesthesia Transfer of Care Note  Patient: Andre Clements  Procedure(s) Performed: CYSTOSCOPY WITH RETROGRADE PYELOGRAM, URETEROSCOPY AND STENT PLACEMENT (Right)  Patient Location: PACU  Anesthesia Type:General  Level of Consciousness: awake and patient cooperative  Airway & Oxygen Therapy: Patient Spontanous Breathing and Patient connected to face mask  Post-op Assessment: Report given to RN and Post -op Vital signs reviewed and stable  Post vital signs: Reviewed and stable  Last Vitals:  Vitals Value Taken Time  BP 123/68 05/25/23 0800  Temp    Pulse 73 05/25/23 0802  Resp 19 05/25/23 0802  SpO2 100 % 05/25/23 0802  Vitals shown include unvalidated device data.  Last Pain:  Vitals:   05/25/23 0405  TempSrc: Axillary  PainSc:          Complications: No notable events documented.

## 2023-05-25 NOTE — Assessment & Plan Note (Addendum)
-  Initially thought to be pre-renal due to hypotension in ED, now suspected to be related more to obstructive uropathy from an obstructing ureteral stone in the R ureter in setting of R solitary functioning kidney. -Urologist suspicious L kidney perhaps not functional more chronically due to severe staghorn calculus disease seen on the CT scan. -Nephrology consulted at [facility], updated on CT results and plan for transfer and urologic intervention -Pt transferred to [facility] at urology request [facility] is for renal recovery following intervention on R ureter / ureteral stone; if no renal recovery: will need to come back to [facility] for possible dialysis -Cont isotonic bicarb gtt -Got lokelma for K 5.3 in ED -Repeat BMP now to determine if bicarb can be transitioned to LR

## 2023-05-25 NOTE — Assessment & Plan Note (Addendum)
-  As discussed above. -s/p R ureteral stent placement with hope for renal recovery -Continue Rocephin for now] -Continue home finasteride

## 2023-05-25 NOTE — ED Notes (Signed)
Pts personal wheelchair transported to [facility] room 1223 by [facility] security

## 2023-05-25 NOTE — ED Notes (Signed)
Report given to [facility], Charity fundraiser.

## 2023-05-25 NOTE — H&P (Signed)
History and Physical    Patient: Andre Clements GMW:[id number] DOB: [birth year] DOA: 05/24/2023 DOS: the patient was seen and examined on 05/25/2023 PCP: Estevan Oaks, NP  Patient coming from: Home  Chief Complaint:  Chief Complaint  Patient presents with   Hypotension   Nausea   Emesis   Abdominal Pain   HPI: Andre Clements is a 52 y.o. 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  Pt in to ED today with c/o vomiting, diarrhea.  Sent over from UC.  He has had about 10 days of diarrhea, as well as some vomiting. Has had hematuria but no dysuria. Has had chills but has not checked his temperature. Has had RLQ abdominal pain the whole time as well. Has some dark and bloody stools. The bloody stools have been for a while, is following up with GI. The hematuria is new.   Review of Systems: As mentioned in the history of present illness. All other systems reviewed and are negative. Past Medical History:  Diagnosis Date   Anxiety    Bipolar 2 disorder (HCC)    Blood in stool    Depression    History of spinal cord injury     form accident,paralysis of BLE   Hyperlipidemia    Hypertension    Urine incontinence    Past Surgical History:  Procedure Laterality Date   ABDOMINAL HERNIA REPAIR  2003   arm surgery Right    from suicide attempt   CYSTOSCOPY WITH LITHOLAPAXY N/A 08/26/2018   Procedure: CYSTOSCOPY WITH LITHOLAPAXY;  Surgeon: Rene Paci, MD;  Location: [facility];  Service: Urology;  Laterality: N/A;   Social History:  reports that he has never smoked. He has never used smokeless tobacco. He reports that he does not drink alcohol and does not use drugs.  No Known Allergies  Family History  Problem Relation Age of Onset   Alcohol abuse Mother    Hypertension Mother    Alcohol abuse Father    Other Neg Hx        hypogonadism   Stomach cancer Neg Hx    Colon cancer Neg Hx    Esophageal cancer  Neg Hx     Prior to Admission medications   Medication Sig Start Date End Date Taking? Authorizing Provider  Ascorbic Acid (VITAMIN C) 250 MG CHEW Chew 500 each by mouth daily.     [provider]  benztropine (COGENTIN) 0.5 MG tablet Take 0.5 mg by mouth daily. 03/25/23   [provider]  Cholecalciferol (VITAMIN D3) 5000 units CAPS Take 5,000 Units by mouth daily.     [provider]  FARXIGA 5 MG TABS tablet Take 5 mg by mouth daily. 03/03/23   [provider]  ferrous sulfate 325 (65 FE) MG tablet Take 325 mg by mouth daily.     [provider]  finasteride (PROSCAR) 5 MG tablet Take 1 tablet (5 mg total) by mouth daily. 04/27/21   Myrlene Broker, MD  haloperidol (HALDOL) 5 MG tablet Take 1 tablet (5 mg total) by mouth daily. 04/27/21   Myrlene Broker, MD  lisinopril (ZESTRIL) 40 MG tablet Take 1 tablet (40 mg total) by mouth daily. 04/27/21   Myrlene Broker, MD  metFORMIN (GLUCOPHAGE-XR) 500 MG 24 hr tablet Take 1 tablet (500 mg total) by mouth daily with breakfast. 04/27/21   Myrlene Broker, MD  Multiple Vitamins-Minerals (CENTRUM VITAMINTS PO)  Take 1 tablet by mouth daily.    [provider]  omeprazole (PRILOSEC) 40 MG capsule Take 1 capsule (40 mg total) by mouth daily. 04/27/21   Myrlene Broker, MD  sertraline (ZOLOFT) 100 MG tablet Take 2 tablets (200 mg total) by mouth daily. 04/27/21   Myrlene Broker, MD  simvastatin (ZOCOR) 40 MG tablet Take 1 tablet (40 mg total) by mouth daily at 6 PM. 04/27/21   Myrlene Broker, MD    Physical Exam: Vitals:   05/25/23 0129 05/25/23 0130 05/25/23 0200 05/25/23 0300  BP: 122/66  (!) 132/51 (!) 114/52  Pulse:   (!) 56 69  Resp: (!) 22  20 18   Temp: 97.9 F (36.6 C)     TempSrc: Oral     SpO2:  94% 100% 100%  Weight: 79.6 kg     Height: 5\' 8"  (1.727 m)      Constitutional: NAD, calm, comfortable Respiratory: clear to auscultation bilaterally, no  wheezing, no crackles. Normal respiratory effort. No accessory muscle use.  Cardiovascular: Regular rate and rhythm, no murmurs / rubs / gallops. No extremity edema. 2+ pedal pulses. No carotid bruits.  Abdomen: RLQ TTP Neurologic: BLE paralysis Psychiatric: Normal judgment and insight. Alert and oriented x 3. Normal mood.   Data Reviewed:    Labs on Admission: I have personally reviewed following labs and imaging studies  CBC: Recent Labs  Lab 05/24/23 1748 05/24/23 1906  WBC 13.0*  --   NEUTROABS 11.1*  --   HGB 10.6* 12.9*  HCT 34.6* 38.0*  MCV 64.4*  --   PLT 563*  --    Basic Metabolic Panel: Recent Labs  Lab 05/24/23 1748 05/24/23 1906  NA 130* 132*  K 5.3* 5.3*  CL 100  --   CO2 12*  --   GLUCOSE 87  --   BUN 123*  --   CREATININE 11.16*  --   CALCIUM 9.3  --    GFR: Estimated Creatinine Clearance: 7.7 mL/min (A) (by C-G formula based on SCr of 11.16 mg/dL (H)). Liver Function Tests: Recent Labs  Lab 05/24/23 1748  AST 10*  ALT 13  ALKPHOS 81  BILITOT 0.3  PROT 9.1*  ALBUMIN 3.2*   Coagulation Profile: Recent Labs  Lab 05/24/23 1815  INR 1.2    Urine analysis:    Component Value Date/Time   COLORURINE YELLOW 05/24/2023 1703   APPEARANCEUR TURBID (A) 05/24/2023 1703   LABSPEC 1.009 05/24/2023 1703   PHURINE 6.0 05/24/2023 1703   GLUCOSEU NEGATIVE 05/24/2023 1703   GLUCOSEU NEGATIVE 07/19/2019 1326   HGBUR MODERATE (A) 05/24/2023 1703   BILIRUBINUR NEGATIVE 05/24/2023 1703   BILIRUBINUR negative 01/03/2020 1311   KETONESUR NEGATIVE 05/24/2023 1703   PROTEINUR 100 (A) 05/24/2023 1703   UROBILINOGEN 0.2 01/03/2020 1311   UROBILINOGEN 0.2 07/19/2019 1326   NITRITE NEGATIVE 05/24/2023 1703   LEUKOCYTESUR MODERATE (A) 05/24/2023 1703    Radiological Exams on Admission: CT ABDOMEN PELVIS WO CONTRAST  Result Date: 05/24/2023 CLINICAL DATA:  Right lower quadrant abdominal pain, hypotension, nausea, emesis. EXAM: CT ABDOMEN AND PELVIS WITHOUT  CONTRAST TECHNIQUE: Multidetector CT imaging of the abdomen and pelvis was performed following the standard protocol without IV contrast. RADIATION DOSE REDUCTION: This exam was performed according to the departmental dose-optimization program which includes automated exposure control, adjustment of the mA and/or kV according to patient size and/or use of iterative reconstruction technique. COMPARISON:  01/06/2020. FINDINGS: Lower chest: A stable 3 mm subpleural nodule  is noted in the left lower lobe, axial image 18, likely benign. Mild atelectasis is present at the lung bases. Hepatobiliary: No focal liver abnormality is seen. No gallstones, gallbladder wall thickening, or biliary dilatation. Pancreas: Unremarkable. No pancreatic ductal dilatation or surrounding inflammatory changes. Spleen: Normal in size without focal abnormality. Adrenals/Urinary Tract: The adrenal glands are within normal limits. Cyst is noted in the lower pole of the right kidney. Renal calculi are present bilaterally. 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 There is moderate hydroureteronephrosis on the right with a 1 cm calculus in the distal right ureter in the pelvis. No obstructive uropathy on the left. Mild perinephric fat stranding is seen bilaterally and periureteral fat stranding is noted on the right. The bladder is within normal limits. Stomach/Bowel: Stomach is within normal limits. Appendix appears normal. No evidence of bowel wall thickening, distention, or inflammatory changes. No free air or pneumatosis. A few scattered diverticula are present along the colon without evidence of diverticulitis. Vascular/Lymphatic: The aorta is normal in caliber. An IVC filter is noted. A few prominent lymph nodes are noted in the upper abdomen at the level of the kidneys and may be reactive. Reproductive: Not seen due to limited field of view. Other: No abdominopelvic ascites. A small fat containing umbilical hernia is present. Musculoskeletal:  Degenerative changes are present in the thoracolumbar spine. Spinal fusion hardware is noted L2. No acute osseous abnormality IMPRESSION: 1. Moderate obstructive uropathy on the right with a 1 cm calculus in the distal right ureter in the pelvis. 2. 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 There is perinephric fat stranding bilaterally and along the proximal right ureter which may be infectious or inflammatory. 3. Remaining incidental findings as described above. Electronically Signed   By: Thornell Sartorius M.D.   On: 05/24/2023 22:00   DG Chest 2 View  Result Date: 05/24/2023 CLINICAL DATA:  Suspected sepsis. Nausea vomiting and diarrhea with right lower quadrant pain EXAM: CHEST - 2 VIEW COMPARISON:  X-ray 08/25/2018 FINDINGS: No consolidation, pneumothorax or effusion. Normal cardiopericardial silhouette without edema. Film is under penetrated. Fixation hardware along the lumbar spine at the edge of the imaging field. IMPRESSION: No acute cardiopulmonary disease Electronically Signed   By: Karen Kays M.D.   On: 05/24/2023 17:49    EKG: Independently reviewed.   Assessment and Plan: * Acute kidney failure (HCC) Initially thought to be pre-renal due to hypotension in ED, now suspected to be related more to obstructive uropathy from an obstructing ureteral stone in the R ureter in setting of R solitary functioning kidney. Urologist suspicious L kidney perhaps not functional more chronically due to severe staghorn calculus disease seen on the CT scan. See nephro note (note that his note was done prior to the CT findings). Pt transferred to [facility] at urology request with plan for intervention this AM Hope is for renal recovery following intervention on R ureter / ureteral stone. NPO If no renal recovery: will need to come back to [facility] for possible dialysis Nephrology updated on transfer / CT results and urology plan for intervention Cont isotonic bicarb gtt Got lokelma for K 5.3 in  ED Repeat BMP this AM  Obstructive uropathy As discussed above. Will leave on rocephin for possible UTI.  Pituitary insufficiency (HCC) Listed on his past problem list; however, I dont see where he takes any chronic medications (ie synthroid, steroids, etc) for this on prior med recs. BP improved after IVF in ED. Renal failure now felt to  be primarily due to obstructive uropathy following the CT scan results. Will hold off on stress dose steroids for the moment.  Impaired fasting blood sugar Looks like he takes metformin as outpt.  Will hold this And put on sensitive SSI Q4H for the moment while NPO.  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  Essential hypertension Hold home BP meds in setting of soft BPs and AKI.      Advance Care Planning:   Code Status: Full Code  Consults: Dr. Thedore Mins (nephrology), Dr. Laverle Patter (urology)  Family Communication: No family in room  Severity of Illness: The appropriate patient status for this patient is INPATIENT. Inpatient status is judged to be reasonable and necessary in order to provide the required intensity of service to ensure the patient's safety. The patient's presenting symptoms, physical exam findings, and initial radiographic and laboratory data in the context of their chronic comorbidities is felt to place them at high risk for further clinical deterioration. Furthermore, it is not anticipated that the patient will be medically stable for discharge from the hospital within 2 midnights of admission.   * I certify that at the point of admission it is my clinical judgment that the patient will require inpatient hospital care spanning beyond 2 midnights from the point of admission due to high intensity of service, high risk for further deterioration and high frequency of surveillance required.*  Author: Hillary Bow., DO 05/25/2023 3:17 AM  For on call review [email or web address].

## 2023-05-25 NOTE — Anesthesia Procedure Notes (Signed)
Procedure Name: LMA Insertion Date/Time: 05/25/2023 7:44 AM  Performed by: Vanessa Wadsworth, CRNAPre-anesthesia Checklist: Emergency Drugs available, Patient identified, Suction available and Patient being monitored Patient Re-evaluated:Patient Re-evaluated prior to induction Oxygen Delivery Method: Circle system utilized Preoxygenation: Pre-oxygenation with 100% oxygen Induction Type: IV induction Ventilation: Mask ventilation without difficulty LMA: LMA inserted and LMA with gastric port inserted LMA Size: 4.0 Number of attempts: 1 Placement Confirmation: positive ETCO2 and breath sounds checked- equal and bilateral Tube secured with: Tape Dental Injury: Teeth and Oropharynx as per pre-operative assessment

## 2023-05-25 NOTE — Assessment & Plan Note (Signed)
-  Patient with an apparent psychotic disorder based on medications, but it is not clear who follows him for this condition -Continue Cogentin, haloperidol, sertraline

## 2023-05-25 NOTE — Assessment & Plan Note (Signed)
Continue simvastatin. 

## 2023-05-25 NOTE — Progress Notes (Signed)
Progress Note   Patient: Andre Clements WUX:[id number] DOB: Jan 26, 1971 DOA: 05/24/2023     1 DOS: the patient was seen and examined on 05/25/2023   Brief hospital course: 52yo with paraplegia from remote spinal cord injury, HTN, HLD, and neurogenic bladder who presented on 6/5 with n/v/d.  He was noted to have hypotension which was thought to be prerenal azotemia but imaging showed obstructive uropathy from a R ureteral stone with solitary R kidney.  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  He underwent cystoscopy on 6/6 with R ureteral stent placement with hope for renal recovery; if no renal recovery he will need to transfer to [facility] for initiation of HD.    Assessment and Plan: * Acute kidney failure (HCC) -Initially thought to be pre-renal due to hypotension in ED, now suspected to be related more to obstructive uropathy from an obstructing ureteral stone in the R ureter in setting of R solitary functioning kidney. -Urologist suspicious L kidney perhaps not functional more chronically due to severe staghorn calculus disease seen on the CT scan. -Nephrology consulted at [facility], updated on CT results and plan for transfer and urologic intervention -Pt transferred to [facility] at urology request [facility] is for renal recovery following intervention on R ureter / ureteral stone; if no renal recovery: will need to come back to [facility] for possible dialysis -Cont isotonic bicarb gtt -Got lokelma for K 5.3 in ED -Repeat BMP now to determine if bicarb can be transitioned to LR  Obstructive uropathy -As discussed above. -s/p R ureteral stent placement with hope for renal recovery -Continue Rocephin for now] -Continue home finasteride  Pituitary insufficiency (HCC) -Listed on his past problem list but not on any chronic medications (ie synthroid, steroids, etc) for this on prior med recs. -BP improved after IVF in ED. -Renal failure now felt to be primarily due to obstructive uropathy  following the CT scan results.  Diabetes mellitus type 2 in nonobese (HCC) -Will check A1c -hold Glucophage, Farxiga -Cover with sensitive-scale SSI   Paralysis of both lower limbs (HCC) -Chronic and baseline -Reports indwelling foley at baseline  Essential hypertension -Patient is on lisinopril which needs to be held for soft BPs as well renal failure  Mood disorder (HCC) -Patient with an apparent psychotic disorder based on medications, but it is not clear who follows him for this condition -Continue Cogentin, haloperidol, sertraline  Hyperlipidemia -Continue simvastatin      Subjective: He is s/p surgery and feeling better.  He was having severe RLQ pain which has improved.  He has been having n/v/d at home which was thought to be a primary GI illness but is now thought to have been from acute renal failure.  He is hungry.  Physical Exam: Vitals:   05/25/23 0800 05/25/23 0815 05/25/23 0830 05/25/23 1200  BP: 123/68 (!) 109/56 108/62   Pulse: 71 72 78   Resp: 16 16 18    Temp: (!) 97.4 F (36.3 C)   97.9 F (36.6 C)  TempSrc:    Oral  SpO2: 100% 95% 94%   Weight:      Height:       General:  Appears calm and comfortable and is in NAD Eyes:   EOMI, normal lids, iris ENT:  grossly normal hearing, lips & tongue, mmm Neck:  no LAD, masses or thyromegaly Cardiovascular:  RRR, no m/r/g. No LE edema.  Respiratory:   CTA bilaterally with no wheezes/rales/rhonchi.  Normal respiratory effort. Abdomen:  soft, mild RLQ TTP, ND Skin:  no rash or induration seen on limited exam Musculoskeletal:  full strength and ROM of BUE, able to move BLE but unable to bear weight Psychiatric:  grossly normal mood and affect, speech fluent and appropriate, AOx3 Neurologic:  CN 2-12 grossly intact   Radiological Exams on Admission: Independently reviewed - see discussion in A/P where applicable  DG C-Arm 1-60 Min-No Report  Result Date: 05/25/2023 Fluoroscopy was utilized by the  requesting physician.  No radiographic interpretation.   CT ABDOMEN PELVIS WO CONTRAST  Result Date: 05/24/2023 CLINICAL DATA:  Right lower quadrant abdominal pain, hypotension, nausea, emesis. EXAM: CT ABDOMEN AND PELVIS WITHOUT CONTRAST TECHNIQUE: Multidetector CT imaging of the abdomen and pelvis was performed following the standard protocol without IV contrast. RADIATION DOSE REDUCTION: This exam was performed according to the departmental dose-optimization program which includes automated exposure control, adjustment of the mA and/or kV according to patient size and/or use of iterative reconstruction technique. COMPARISON:  01/06/2020. FINDINGS: Lower chest: A stable 3 mm subpleural nodule is noted in the left lower lobe, axial image 18, likely benign. Mild atelectasis is present at the lung bases. Hepatobiliary: No focal liver abnormality is seen. No gallstones, gallbladder wall thickening, or biliary dilatation. Pancreas: Unremarkable. No pancreatic ductal dilatation or surrounding inflammatory changes. Spleen: Normal in size without focal abnormality. Adrenals/Urinary Tract: The adrenal glands are within normal limits. Cyst is noted in the lower pole of the right kidney. Renal calculi are present bilaterally. 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 There is moderate hydroureteronephrosis on the right with a 1 cm calculus in the distal right ureter in the pelvis. No obstructive uropathy on the left. Mild perinephric fat stranding is seen bilaterally and periureteral fat stranding is noted on the right. The bladder is within normal limits. Stomach/Bowel: Stomach is within normal limits. Appendix appears normal. No evidence of bowel wall thickening, distention, or inflammatory changes. No free air or pneumatosis. A few scattered diverticula are present along the colon without evidence of diverticulitis. Vascular/Lymphatic: The aorta is normal in caliber. An IVC filter is noted. A few prominent lymph nodes  are noted in the upper abdomen at the level of the kidneys and may be reactive. Reproductive: Not seen due to limited field of view. Other: No abdominopelvic ascites. A small fat containing umbilical hernia is present. Musculoskeletal: Degenerative changes are present in the thoracolumbar spine. Spinal fusion hardware is noted L2. No acute osseous abnormality IMPRESSION: 1. Moderate obstructive uropathy on the right with a 1 cm calculus in the distal right ureter in the pelvis. 2. 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 There is perinephric fat stranding bilaterally and along the proximal right ureter which may be infectious or inflammatory. 3. Remaining incidental findings as described above. Electronically Signed   By: Thornell Sartorius M.D.   On: 05/24/2023 22:00   DG Chest 2 View  Result Date: 05/24/2023 CLINICAL DATA:  Suspected sepsis. Nausea vomiting and diarrhea with right lower quadrant pain EXAM: CHEST - 2 VIEW COMPARISON:  X-ray 08/25/2018 FINDINGS: No consolidation, pneumothorax or effusion. Normal cardiopericardial silhouette without edema. Film is under penetrated. Fixation hardware along the lumbar spine at the edge of the imaging field. IMPRESSION: No acute cardiopulmonary disease Electronically Signed   By: Karen Kays M.D.   On: 05/24/2023 17:49    EKG: Independently reviewed.  NSR with rate 77; no evidence of acute ischemia   Labs on Admission: I have personally reviewed the available labs  and imaging studies at the time of the admission.  Pertinent labs:    VBG: 7.091/44.9/13.7 Na++ 130 CO2 18 BUN 107/Creatinine 8.94/GFR 7 WBC 8.1 Hgb 8.7 Platelets 496 UA: moderate Hgb, moderate LE, protein 100, many bacteria Blood and urine cultures pending  Family Communication: I attempted to reach his aunt by telephone in the afternoon without success  Disposition: Status is: Inpatient Remains inpatient appropriate because: procedure, renal failure, IV  antibiotics  Planned Discharge Destination: Home    Time spent: 50 minutes  Author: Jonah Blue, MD 05/25/2023 2:20 PM  For on call review [email or web address].

## 2023-05-25 NOTE — Progress Notes (Signed)
Patient ID: Andre Clements, male   DOB: [birth year], 52 y.o.   MRN: [id number] S:Pt s/p cystoscopy with right ureteral stent placement this morning. O:BP 108/62 (BP Location: Left Arm)   Pulse 78   Temp (!) 97.4 F (36.3 C)   Resp 18   Ht 5\' 8"  (1.727 m)   Wt 79.6 kg   SpO2 94%   BMI 26.68 kg/m   Intake/Output Summary (Last 24 hours) at 05/25/2023 1048 Last data filed at 05/25/2023 0743 Gross per 24 hour  Intake 3937.07 ml  Output 750 ml  Net 3187.07 ml   Intake/Output: I/O last 3 completed shifts: In: 3337.1 [I.V.:1237.1; IV Piggyback:2100] Out: 750 [Urine:750]  Intake/Output this shift:  Total I/O In: 600 [I.V.:600] Out: 0  Weight change:  Gen: NAD CVS: RRR Resp:CTA Abd: +BS, soft, NT/ND Ext: no edema  Recent Labs  Lab 05/24/23 1748 05/24/23 1906 05/25/23 0301  NA 130* 132* 130*  K 5.3* 5.3* 4.5  CL 100  --  99  CO2 12*  --  18*  GLUCOSE 87  --  83  BUN 123*  --  107*  CREATININE 11.16*  --  8.94*  ALBUMIN 3.2*  --   --   CALCIUM 9.3  --  8.3*  AST 10*  --   --   ALT 13  --   --    Liver Function Tests: Recent Labs  Lab 05/24/23 1748  AST 10*  ALT 13  ALKPHOS 81  BILITOT 0.3  PROT 9.1*  ALBUMIN 3.2*   No results for input(s): "LIPASE", "AMYLASE" in the last 168 hours. No results for input(s): "AMMONIA" in the last 168 hours. CBC: Recent Labs  Lab 05/24/23 1748 05/24/23 1906 05/25/23 0301  WBC 13.0*  --  8.1  NEUTROABS 11.1*  --   --   HGB 10.6* 12.9* 8.7*  HCT 34.6* 38.0* 27.7*  MCV 64.4*  --  64.4*  PLT 563*  --  496*   Cardiac Enzymes: No results for input(s): "CKTOTAL", "CKMB", "CKMBINDEX", "TROPONINI" in the last 168 hours. CBG: Recent Labs  Lab 05/25/23 0426 05/25/23 0557 05/25/23 0805  GLUCAP 70 88 82    Iron Studies: No results for input(s): "IRON", "TIBC", "TRANSFERRIN", "FERRITIN" in the last 72 hours. Studies/Results: DG C-Arm 1-60 Min-No Report  Result Date: 05/25/2023 Fluoroscopy was utilized by the requesting  physician.  No radiographic interpretation.   CT ABDOMEN PELVIS WO CONTRAST  Result Date: 05/24/2023 CLINICAL DATA:  Right lower quadrant abdominal pain, hypotension, nausea, emesis. EXAM: CT ABDOMEN AND PELVIS WITHOUT CONTRAST TECHNIQUE: Multidetector CT imaging of the abdomen and pelvis was performed following the standard protocol without IV contrast. RADIATION DOSE REDUCTION: This exam was performed according to the departmental dose-optimization program which includes automated exposure control, adjustment of the mA and/or kV according to patient size and/or use of iterative reconstruction technique. COMPARISON:  01/06/2020. FINDINGS: Lower chest: A stable 3 mm subpleural nodule is noted in the left lower lobe, axial image 18, likely benign. Mild atelectasis is present at the lung bases. Hepatobiliary: No focal liver abnormality is seen. No gallstones, gallbladder wall thickening, or biliary dilatation. Pancreas: Unremarkable. No pancreatic ductal dilatation or surrounding inflammatory changes. Spleen: Normal in size without focal abnormality. Adrenals/Urinary Tract: The adrenal glands are within normal limits. Cyst is noted in the lower pole of the right kidney. Renal calculi are present bilaterally. 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 There is moderate hydroureteronephrosis on the right with a 1 cm calculus  in the distal right ureter in the pelvis. No obstructive uropathy on the left. Mild perinephric fat stranding is seen bilaterally and periureteral fat stranding is noted on the right. The bladder is within normal limits. Stomach/Bowel: Stomach is within normal limits. Appendix appears normal. No evidence of bowel wall thickening, distention, or inflammatory changes. No free air or pneumatosis. A few scattered diverticula are present along the colon without evidence of diverticulitis. Vascular/Lymphatic: The aorta is normal in caliber. An IVC filter is noted. A few prominent lymph nodes are noted in  the upper abdomen at the level of the kidneys and may be reactive. Reproductive: Not seen due to limited field of view. Other: No abdominopelvic ascites. A small fat containing umbilical hernia is present. Musculoskeletal: Degenerative changes are present in the thoracolumbar spine. Spinal fusion hardware is noted L2. No acute osseous abnormality IMPRESSION: 1. Moderate obstructive uropathy on the right with a 1 cm calculus in the distal right ureter in the pelvis. 2. 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 There is perinephric fat stranding bilaterally and along the proximal right ureter which may be infectious or inflammatory. 3. Remaining incidental findings as described above. Electronically Signed   By: Thornell Sartorius M.D.   On: 05/24/2023 22:00   DG Chest 2 View  Result Date: 05/24/2023 CLINICAL DATA:  Suspected sepsis. Nausea vomiting and diarrhea with right lower quadrant pain EXAM: CHEST - 2 VIEW COMPARISON:  X-ray 08/25/2018 FINDINGS: No consolidation, pneumothorax or effusion. Normal cardiopericardial silhouette without edema. Film is under penetrated. Fixation hardware along the lumbar spine at the edge of the imaging field. IMPRESSION: No acute cardiopulmonary disease Electronically Signed   By: Karen Kays M.D.   On: 05/24/2023 17:49    benztropine  0.5 mg Oral Daily   Chlorhexidine Gluconate Cloth  6 each Topical Daily   finasteride  5 mg Oral Daily   haloperidol  5 mg Oral Daily   insulin aspart  0-9 Units Subcutaneous Q4H   sertraline  200 mg Oral Daily   sodium zirconium cyclosilicate  10 g Oral Daily    BMET    Component Value Date/Time   NA 130 (L) 05/25/2023 0301   K 4.5 05/25/2023 0301   CL 99 05/25/2023 0301   CO2 18 (L) 05/25/2023 0301   GLUCOSE 83 05/25/2023 0301   BUN 107 (H) 05/25/2023 0301   CREATININE 8.94 (H) 05/25/2023 0301   CALCIUM 8.3 (L) 05/25/2023 0301   GFRNONAA 7 (L) 05/25/2023 0301   GFRAA >60 08/27/2018 0505   CBC    Component  Value Date/Time   WBC 8.1 05/25/2023 0301   RBC 4.30 05/25/2023 0301   HGB 8.7 (L) 05/25/2023 0301   HCT 27.7 (L) 05/25/2023 0301   PLT 496 (H) 05/25/2023 0301   MCV 64.4 (L) 05/25/2023 0301   MCH 20.2 (L) 05/25/2023 0301   MCHC 31.4 05/25/2023 0301   RDW 17.6 (H) 05/25/2023 0301   LYMPHSABS 1.3 05/24/2023 1748   MONOABS 0.4 05/24/2023 1748   EOSABS 0.0 05/24/2023 1748   BASOSABS 0.0 05/24/2023 1748   History of Present Illness: NAMEER HAGLE is a/an 52 y.o. 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 Apparently has been having chronic dark bloody stools and is following with GI for this. However, there has been reports of hematuria. Found to have  a Cr of 11.16, BUN 123, K 5.3, Na 130, Hgb 10.6 (repeat 12.9), WBC 13, pH 7.09. s/p 2L LR, started on bicarb gtt after discussing with ER.   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  SCr peaked at 11.16.  Pt with unknown baseline Scr since 2021 when it was 0.79, so unclear on the chronicity of his kidney disease.  Scr has started to improve prior to stent placement.  Hopefully will continue to improve, however doubtful that he will get back to normal with staghorn calculus in the left kidney.  If Scr does not improve with right stent alone, plan for left PNT placement by IR is next step per Urology.  Continue to hold lisinopril and Farxiga.  No indication for dialysis at this time.  Will continue to follow closely.  Avoid nephrotoxic medications including NSAIDs and iodinated intravenous contrast exposure unless the latter is absolutely indicated.   Preferred narcotic agents for pain control are  hydromorphone, fentanyl, and methadone. Morphine should not be used.  Avoid Baclofen and avoid oral sodium phosphate and magnesium citrate based laxatives / bowel preps.  Continue strict Input and Output monitoring.  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  S/p cysto and right ureteral stent placement this morning.  Plan per Urology. Microcytic anemia - will check iron stores and follow. Metabolic acidosis - presumably due to #1.  Will continue to follow while on isotonic bicarb drip. Hyponatremia - likely due to AKI but will follow.  He does have h/o pituitary insufficiency per records, but not on any meds.  No sodium levels since 2021.  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 HTN - hold lisinopril.   Irena Cords, MD [employer] [phone]

## 2023-05-25 NOTE — Assessment & Plan Note (Addendum)
-  Patient is on lisinopril which needs to be held for soft BPs as well renal failure

## 2023-05-25 NOTE — Assessment & Plan Note (Addendum)
-  Listed on his past problem list but not on any chronic medications (ie synthroid, steroids, etc) for this on prior med recs. -BP improved after IVF in ED. -Renal failure now felt to be primarily due to obstructive uropathy following the CT scan results.

## 2023-05-25 NOTE — Hospital Course (Signed)
51yo with paraplegia from remote spinal cord injury, HTN, HLD, and neurogenic bladder who presented on 6/5 with n/v/d.  He was noted to have hypotension which was thought to be prerenal azotemia but imaging showed obstructive uropathy from a R ureteral stone with solitary R kidney.  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  He underwent cystoscopy on 6/6 with R ureteral stent placement with hope for renal recovery; if no renal recovery he will need to transfer to [facility] for initiation of HD.

## 2023-05-25 NOTE — Consult Note (Signed)
Urology Consult   Physician requesting consult: Dr. Lyda Perone  Reason for consult: Right ureteral stone and acute kidney injury  History of Present Illness: Andre Clements is a 52 y.o. previously followed by Dr. Liliane Shi for removal of a large bladder stone a few years ago.  He presented to the ED last night with nausea/vomiting, right abdominal pain, lethargy as well as diarrhea.  CT imaging revealed a 1 cm right ureteral stone with hydronephrosis and a large left staghorn renal calculus.  His Cr was over 11.  He was transferred from [facility] ED to [facility] for surgical intervention.   Past Medical History:  Diagnosis Date   Anxiety    Bipolar 2 disorder (HCC)    Blood in stool    Depression    History of spinal cord injury     form accident,paralysis of BLE   Hyperlipidemia    Hypertension    Urine incontinence     Past Surgical History:  Procedure Laterality Date   ABDOMINAL HERNIA REPAIR  2003   arm surgery Right    from suicide attempt   CYSTOSCOPY WITH LITHOLAPAXY N/A 08/26/2018   Procedure: CYSTOSCOPY WITH LITHOLAPAXY;  Surgeon: Rene Paci, MD;  Location: [facility];  Service: Urology;  Laterality: N/A;    Current Hospital Medications:  Home Meds:  No current facility-administered medications on file prior to encounter.   Current Outpatient Medications on File Prior to Encounter  Medication Sig Dispense Refill   Ascorbic Acid (VITAMIN C) 250 MG CHEW Chew 500 each by mouth daily.      benztropine (COGENTIN) 0.5 MG tablet Take 0.5 mg by mouth daily.     Cholecalciferol (VITAMIN D3) 5000 units CAPS Take 5,000 Units by mouth daily.      FARXIGA 5 MG TABS tablet Take 5 mg by mouth daily.     ferrous sulfate 325 (65 FE) MG tablet Take 325 mg by mouth daily.      finasteride (PROSCAR) 5 MG tablet Take 1 tablet (5 mg total) by mouth daily. 90 tablet 3   haloperidol (HALDOL) 5 MG tablet Take 1 tablet (5 mg total) by mouth daily. 90 tablet 3   lisinopril (ZESTRIL) 40 MG  tablet Take 1 tablet (40 mg total) by mouth daily. 90 tablet 3   metFORMIN (GLUCOPHAGE-XR) 500 MG 24 hr tablet Take 1 tablet (500 mg total) by mouth daily with breakfast. (Patient taking differently: Take 1,000 mg by mouth 2 (two) times daily with a meal.) 90 tablet 3   Multiple Vitamins-Minerals (CENTRUM VITAMINTS PO) Take 1 tablet by mouth daily.     omeprazole (PRILOSEC) 40 MG capsule Take 1 capsule (40 mg total) by mouth daily. 90 capsule 3   sertraline (ZOLOFT) 100 MG tablet Take 2 tablets (200 mg total) by mouth daily. 180 tablet 3   simvastatin (ZOCOR) 40 MG tablet Take 1 tablet (40 mg total) by mouth daily at 6 PM. 90 tablet 3     Scheduled Meds:  benztropine  0.5 mg Oral Daily   Chlorhexidine Gluconate Cloth  6 each Topical Daily   finasteride  5 mg Oral Daily   haloperidol  5 mg Oral Daily   insulin aspart  0-9 Units Subcutaneous Q4H   sertraline  200 mg Oral Daily   sodium zirconium cyclosilicate  10 g Oral Daily   Continuous Infusions:  cefTRIAXone (ROCEPHIN)  IV     sodium bicarbonate 150 mEq in sterile water 1,150 mL infusion 150 mL/hr at 05/25/23  0500   PRN Meds:.acetaminophen **OR** acetaminophen, ondansetron **OR** ondansetron (ZOFRAN) IV, mouth rinse  Allergies: No Known Allergies  Family History  Problem Relation Age of Onset   Alcohol abuse Mother    Hypertension Mother    Alcohol abuse Father    Other Neg Hx        hypogonadism   Stomach cancer Neg Hx    Colon cancer Neg Hx    Esophageal cancer Neg Hx     Social History:  reports that he has never smoked. He has never used smokeless tobacco. He reports that he does not drink alcohol and does not use drugs.  ROS: A complete review of systems was performed.  All systems are negative except for pertinent findings as noted.  Physical Exam:  Vital signs in last 24 hours: Temp:  [97.4 F (36.3 C)-99.5 F (37.5 C)] 97.4 F (36.3 C) (06/06 0405) Pulse Rate:  [56-87] 59 (06/06 0500) Resp:  [17-25] 17  (06/06 0500) BP: (82-132)/(47-73) 129/69 (06/06 0500) SpO2:  [94 %-100 %] 100 % (06/06 0500) Weight:  [79.6 kg-99.8 kg] 79.6 kg (06/06 0129) Constitutional:  Alert and oriented, No acute distress Cardiovascular: No JVD Respiratory: Normal respiratory effort GI: Mild RLQ tenderness GU: Moderate CVA tenderness Lymphatic: No lymphadenopathy Psychiatric: Normal mood and affect  Laboratory Data:  Recent Labs    05/24/23 1748 05/24/23 1906 05/25/23 0301  WBC 13.0*  --  8.1  HGB 10.6* 12.9* 8.7*  HCT 34.6* 38.0* 27.7*  PLT 563*  --  496*    Recent Labs    05/24/23 1748 05/24/23 1906 05/25/23 0301  NA 130* 132* 130*  K 5.3* 5.3* 4.5  CL 100  --  99  GLUCOSE 87  --  83  BUN 123*  --  107*  CALCIUM 9.3  --  8.3*  CREATININE 11.16*  --  8.94*     Results for orders placed or performed during the hospital encounter of 05/24/23 (from the past 24 hour(s))  Urinalysis, w/ Reflex to Culture (Infection Suspected) -Urine, Clean Catch     Status: Abnormal   Collection Time: 05/24/23  5:03 PM  Result Value Ref Range   Specimen Source URINE, CATHETERIZED    Color, Urine YELLOW YELLOW   APPearance TURBID (A) CLEAR   Specific Gravity, Urine 1.009 1.005 - 1.030   pH 6.0 5.0 - 8.0   Glucose, UA NEGATIVE NEGATIVE mg/dL   Hgb urine dipstick MODERATE (A) NEGATIVE   Bilirubin Urine NEGATIVE NEGATIVE   Ketones, ur NEGATIVE NEGATIVE mg/dL   Protein, ur 161 (A) NEGATIVE mg/dL   Nitrite NEGATIVE NEGATIVE   Leukocytes,Ua MODERATE (A) NEGATIVE   RBC / HPF >50 0 - 5 RBC/hpf   WBC, UA >50 0 - 5 WBC/hpf   Bacteria, UA MANY (A) NONE SEEN   Squamous Epithelial / HPF 0-5 0 - 5 /HPF   WBC Clumps PRESENT    Non Squamous Epithelial 0-5 (A) NONE SEEN  Lactic acid, plasma     Status: None   Collection Time: 05/24/23  5:40 PM  Result Value Ref Range   Lactic Acid, Venous 1.2 0.5 - 1.9 mmol/L  Comprehensive metabolic panel     Status: Abnormal   Collection Time: 05/24/23  5:48 PM  Result Value Ref  Range   Sodium 130 (L) 135 - 145 mmol/L   Potassium 5.3 (H) 3.5 - 5.1 mmol/L   Chloride 100 98 - 111 mmol/L   CO2 12 (L) 22 - 32 mmol/L  Glucose, Bld 87 70 - 99 mg/dL   BUN 161 (H) 6 - 20 mg/dL   Creatinine, Ser 09.60 (H) 0.61 - 1.24 mg/dL   Calcium 9.3 8.9 - 45.4 mg/dL   Total Protein 9.1 (H) 6.5 - 8.1 g/dL   Albumin 3.2 (L) 3.5 - 5.0 g/dL   AST 10 (L) 15 - 41 U/L   ALT 13 0 - 44 U/L   Alkaline Phosphatase 81 38 - 126 U/L   Total Bilirubin 0.3 0.3 - 1.2 mg/dL   GFR, Estimated 5 (L) >60 mL/min   Anion gap 18 (H) 5 - 15  CBC with Differential     Status: Abnormal   Collection Time: 05/24/23  5:48 PM  Result Value Ref Range   WBC 13.0 (H) 4.0 - 10.5 K/uL   RBC 5.37 4.22 - 5.81 MIL/uL   Hemoglobin 10.6 (L) 13.0 - 17.0 g/dL   HCT 09.8 (L) 11.9 - 14.7 %   MCV 64.4 (L) 80.0 - 100.0 fL   MCH 19.7 (L) 26.0 - 34.0 pg   MCHC 30.6 30.0 - 36.0 g/dL   RDW 82.9 (H) 56.2 - 13.0 %   Platelets 563 (H) 150 - 400 K/uL   nRBC 0.0 0.0 - 0.2 %   Neutrophils Relative % 86 %   Neutro Abs 11.1 (H) 1.7 - 7.7 K/uL   Lymphocytes Relative 10 %   Lymphs Abs 1.3 0.7 - 4.0 K/uL   Monocytes Relative 3 %   Monocytes Absolute 0.4 0.1 - 1.0 K/uL   Eosinophils Relative 0 %   Eosinophils Absolute 0.0 0.0 - 0.5 K/uL   Basophils Relative 0 %   Basophils Absolute 0.0 0.0 - 0.1 K/uL   WBC Morphology Mild Left Shift (1-5% metas, occ myelo)    RBC Morphology MORPHOLOGY UNREMARKABLE    Smear Review PLATELETS APPEAR INCREASED    Immature Granulocytes 1 %   Abs Immature Granulocytes 0.13 (H) 0.00 - 0.07 K/uL  Protime-INR     Status: Abnormal   Collection Time: 05/24/23  6:15 PM  Result Value Ref Range   Prothrombin Time 15.5 (H) 11.4 - 15.2 seconds   INR 1.2 0.8 - 1.2  I-Stat venous blood gas, ED     Status: Abnormal   Collection Time: 05/24/23  7:06 PM  Result Value Ref Range   pH, Ven 7.091 (LL) 7.25 - 7.43   pCO2, Ven 44.9 44 - 60 mmHg   pO2, Ven 60 (H) 32 - 45 mmHg   Bicarbonate 13.7 (L) 20.0 - 28.0  mmol/L   TCO2 15 (L) 22 - 32 mmol/L   O2 Saturation 80 %   Acid-base deficit 16.0 (H) 0.0 - 2.0 mmol/L   Sodium 132 (L) 135 - 145 mmol/L   Potassium 5.3 (H) 3.5 - 5.1 mmol/L   Calcium, Ion 1.20 1.15 - 1.40 mmol/L   HCT 38.0 (L) 39.0 - 52.0 %   Hemoglobin 12.9 (L) 13.0 - 17.0 g/dL   Sample type VENOUS    Comment NOTIFIED PHYSICIAN   MRSA Next Gen by PCR, Nasal     Status: None   Collection Time: 05/25/23  1:27 AM   Specimen: Nasal Mucosa; Nasal Swab  Result Value Ref Range   MRSA by PCR Next Gen NOT DETECTED NOT DETECTED  CBC     Status: Abnormal   Collection Time: 05/25/23  3:01 AM  Result Value Ref Range   WBC 8.1 4.0 - 10.5 K/uL   RBC 4.30 4.22 - 5.81 MIL/uL  Hemoglobin 8.7 (L) 13.0 - 17.0 g/dL   HCT 09.8 (L) 11.9 - 14.7 %   MCV 64.4 (L) 80.0 - 100.0 fL   MCH 20.2 (L) 26.0 - 34.0 pg   MCHC 31.4 30.0 - 36.0 g/dL   RDW 82.9 (H) 56.2 - 13.0 %   Platelets 496 (H) 150 - 400 K/uL   nRBC 0.0 0.0 - 0.2 %  Basic metabolic panel     Status: Abnormal   Collection Time: 05/25/23  3:01 AM  Result Value Ref Range   Sodium 130 (L) 135 - 145 mmol/L   Potassium 4.5 3.5 - 5.1 mmol/L   Chloride 99 98 - 111 mmol/L   CO2 18 (L) 22 - 32 mmol/L   Glucose, Bld 83 70 - 99 mg/dL   BUN 865 (H) 6 - 20 mg/dL   Creatinine, Ser 7.84 (H) 0.61 - 1.24 mg/dL   Calcium 8.3 (L) 8.9 - 10.3 mg/dL   GFR, Estimated 7 (L) >60 mL/min   Anion gap 13 5 - 15  Glucose, capillary     Status: None   Collection Time: 05/25/23  4:26 AM  Result Value Ref Range   Glucose-Capillary 70 70 - 99 mg/dL  Glucose, capillary     Status: None   Collection Time: 05/25/23  5:57 AM  Result Value Ref Range   Glucose-Capillary 88 70 - 99 mg/dL   Recent Results (from the past 240 hour(s))  MRSA Next Gen by PCR, Nasal     Status: None   Collection Time: 05/25/23  1:27 AM   Specimen: Nasal Mucosa; Nasal Swab  Result Value Ref Range Status   MRSA by PCR Next Gen NOT DETECTED NOT DETECTED Final    Comment: (NOTE) The GeneXpert  MRSA Assay (FDA approved for NASAL specimens only), is one component of a comprehensive MRSA colonization surveillance program. It is not intended to diagnose MRSA infection nor to guide or monitor treatment for MRSA infections. Test performance is not FDA approved in patients less than 55 years old. Performed at [facility], [address], [place], [place] [address]     Renal Function: Recent Labs    05/24/23 1748 05/25/23 0301  CREATININE 11.16* 8.94*   Estimated Creatinine Clearance: 9.7 mL/min (A) (by C-G formula based on SCr of 8.94 mg/dL (H)).  Radiologic Imaging: CT ABDOMEN PELVIS WO CONTRAST  Result Date: 05/24/2023 CLINICAL DATA:  Right lower quadrant abdominal pain, hypotension, nausea, emesis. EXAM: CT ABDOMEN AND PELVIS WITHOUT CONTRAST TECHNIQUE: Multidetector CT imaging of the abdomen and pelvis was performed following the standard protocol without IV contrast. RADIATION DOSE REDUCTION: This exam was performed according to the departmental dose-optimization program which includes automated exposure control, adjustment of the mA and/or kV according to patient size and/or use of iterative reconstruction technique. COMPARISON:  01/06/2020. FINDINGS: Lower chest: A stable 3 mm subpleural nodule is noted in the left lower lobe, axial image 18, likely benign. Mild atelectasis is present at the lung bases. Hepatobiliary: No focal liver abnormality is seen. No gallstones, gallbladder wall thickening, or biliary dilatation. Pancreas: Unremarkable. No pancreatic ductal dilatation or surrounding inflammatory changes. Spleen: Normal in size without focal abnormality. Adrenals/Urinary Tract: The adrenal glands are within normal limits. Cyst is noted in the lower pole of the right kidney. Renal calculi are present bilaterally. 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 There is moderate hydroureteronephrosis on the right with a 1 cm calculus in the distal right ureter in the  pelvis. No obstructive  uropathy on the left. Mild perinephric fat stranding is seen bilaterally and periureteral fat stranding is noted on the right. The bladder is within normal limits. Stomach/Bowel: Stomach is within normal limits. Appendix appears normal. No evidence of bowel wall thickening, distention, or inflammatory changes. No free air or pneumatosis. A few scattered diverticula are present along the colon without evidence of diverticulitis. Vascular/Lymphatic: The aorta is normal in caliber. An IVC filter is noted. A few prominent lymph nodes are noted in the upper abdomen at the level of the kidneys and may be reactive. Reproductive: Not seen due to limited field of view. Other: No abdominopelvic ascites. A small fat containing umbilical hernia is present. Musculoskeletal: Degenerative changes are present in the thoracolumbar spine. Spinal fusion hardware is noted L2. No acute osseous abnormality IMPRESSION: 1. Moderate obstructive uropathy on the right with a 1 cm calculus in the distal right ureter in the pelvis. 2. 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 There is perinephric fat stranding bilaterally and along the proximal right ureter which may be infectious or inflammatory. 3. Remaining incidental findings as described above. Electronically Signed   By: Thornell Sartorius M.D.   On: 05/24/2023 22:00   DG Chest 2 View  Result Date: 05/24/2023 CLINICAL DATA:  Suspected sepsis. Nausea vomiting and diarrhea with right lower quadrant pain EXAM: CHEST - 2 VIEW COMPARISON:  X-ray 08/25/2018 FINDINGS: No consolidation, pneumothorax or effusion. Normal cardiopericardial silhouette without edema. Film is under penetrated. Fixation hardware along the lumbar spine at the edge of the imaging field. IMPRESSION: No acute cardiopulmonary disease Electronically Signed   By: Karen Kays M.D.   On: 05/24/2023 17:49    I independently reviewed the above imaging  studies.  Impression/Recommendation 1) Right ureteral calculus and AKI:  Will proceed with cystoscopy and right ureteral stent placement this morning.  Follow renal function after stent placement and would expect improvement although timing of the development of this stone is unclear and could be more chronic. 2) Left staghorn renal calculus:  This does not appear obstructing but with very little urine output, there is suggestion that his left kidney may have relatively little function.  If his renal function does not improve follow right ureteral stent placement, he may benefit from left nephrostomy tube placement as that stone would require percutaneous treatment.  Even if his renal function improves, he will benefit from a renogram to determine the relative renal function of the left kidney to determine whether treatment of his staghorn stone or nephrectomy would be indicated in the future. 3) Bacteruria:  Urine culture pending.  He has been afebrile.  He will be decompressed after stent placement.  Continue ceftriaxone and follow cultures.  Crecencio Mc 05/25/2023, 6:08 AM    Moody Bruins MD   CC: Dr. Lyda Perone

## 2023-05-26 ENCOUNTER — Encounter (HOSPITAL_COMMUNITY): Payer: Self-pay | Admitting: Urology

## 2023-05-26 ENCOUNTER — Other Ambulatory Visit: Payer: Self-pay

## 2023-05-26 DIAGNOSIS — N179 Acute kidney failure, unspecified: Secondary | ICD-10-CM | POA: Diagnosis not present

## 2023-05-26 LAB — BASIC METABOLIC PANEL
Anion gap: 14 (ref 5–15)
BUN: 89 mg/dL — ABNORMAL HIGH (ref 6–20)
CO2: 26 mmol/L (ref 22–32)
Calcium: 7.8 mg/dL — ABNORMAL LOW (ref 8.9–10.3)
Chloride: 98 mmol/L (ref 98–111)
Creatinine, Ser: 5.22 mg/dL — ABNORMAL HIGH (ref 0.61–1.24)
GFR, Estimated: 13 mL/min — ABNORMAL LOW (ref >60)
Glucose, Bld: 130 mg/dL — ABNORMAL HIGH (ref 70–99)
Potassium: 3.7 mmol/L (ref 3.5–5.1)
Sodium: 138 mmol/L (ref 135–145)

## 2023-05-26 LAB — CBC WITH DIFFERENTIAL/PLATELET
Abs Immature Granulocytes: 0.06 10*3/uL (ref 0.00–0.07)
Basophils Absolute: 0 10*3/uL (ref 0.0–0.1)
Basophils Relative: 0 %
Eosinophils Absolute: 0.1 10*3/uL (ref 0.0–0.5)
Eosinophils Relative: 1 %
HCT: 25 % — ABNORMAL LOW (ref 39.0–52.0)
Hemoglobin: 8 g/dL — ABNORMAL LOW (ref 13.0–17.0)
Immature Granulocytes: 1 %
Lymphocytes Relative: 18 %
Lymphs Abs: 1.7 10*3/uL (ref 0.7–4.0)
MCH: 20.4 pg — ABNORMAL LOW (ref 26.0–34.0)
MCHC: 32 g/dL (ref 30.0–36.0)
MCV: 63.8 fL — ABNORMAL LOW (ref 80.0–100.0)
Monocytes Absolute: 0.7 10*3/uL (ref 0.1–1.0)
Monocytes Relative: 8 %
Neutro Abs: 6.8 10*3/uL (ref 1.7–7.7)
Neutrophils Relative %: 72 %
Platelets: 462 10*3/uL — ABNORMAL HIGH (ref 150–400)
RBC: 3.92 MIL/uL — ABNORMAL LOW (ref 4.22–5.81)
RDW: 17.1 % — ABNORMAL HIGH (ref 11.5–15.5)
WBC: 9.4 10*3/uL (ref 4.0–10.5)
nRBC: 0 % (ref 0.0–0.2)

## 2023-05-26 LAB — GLUCOSE, CAPILLARY
Glucose-Capillary: 130 mg/dL — ABNORMAL HIGH (ref 70–99)
Glucose-Capillary: 115 mg/dL — ABNORMAL HIGH (ref 70–99)
Glucose-Capillary: 109 mg/dL — ABNORMAL HIGH (ref 70–99)
Glucose-Capillary: 105 mg/dL — ABNORMAL HIGH (ref 70–99)
Glucose-Capillary: 123 mg/dL — ABNORMAL HIGH (ref 70–99)

## 2023-05-26 LAB — HEMOGLOBIN A1C
Hgb A1c MFr Bld: 6.2 % — ABNORMAL HIGH (ref 4.8–5.6)
Mean Plasma Glucose: 131 mg/dL

## 2023-05-26 LAB — CULTURE, BLOOD (ROUTINE X 2): Culture: NO GROWTH

## 2023-05-26 LAB — URINE CULTURE: Culture: 80000 — AB

## 2023-05-26 MED ORDER — LACTATED RINGERS IV SOLN: INTRAVENOUS | Status: DC

## 2023-05-26 NOTE — Plan of Care (Signed)
Patient alert and oriented X4, admitted to room, tele in place, skin intact, SCD's on, patients bed in lowest position, call bell within reach, assessment completed, no complains of pain, nausea, or SOB. Will continue to assess and monitor.  Problem: Education: Goal: Knowledge of General Education information will improve Description: Including pain rating scale, medication(s)/side effects and non-pharmacologic comfort measures Outcome: Progressing   Problem: Health Behavior/Discharge Planning: Goal: Ability to manage health-related needs will improve Outcome: Progressing   Problem: Clinical Measurements: Goal: Ability to maintain clinical measurements within normal limits will improve Outcome: Progressing Goal: Will remain free from infection Outcome: Progressing Goal: Diagnostic test results will improve Outcome: Progressing Goal: Respiratory complications will improve Outcome: Progressing Goal: Cardiovascular complication will be avoided Outcome: Progressing   Problem: Activity: Goal: Risk for activity intolerance will decrease Outcome: Progressing   Problem: Nutrition: Goal: Adequate nutrition will be maintained Outcome: Progressing   Problem: Coping: Goal: Level of anxiety will decrease Outcome: Progressing   Problem: Elimination: Goal: Will not experience complications related to bowel motility Outcome: Progressing Goal: Will not experience complications related to urinary retention Outcome: Progressing   Problem: Pain Managment: Goal: General experience of comfort will improve Outcome: Progressing   Problem: Safety: Goal: Ability to remain free from injury will improve Outcome: Progressing   Problem: Skin Integrity: Goal: Risk for impaired skin integrity will decrease Outcome: Progressing   Problem: Education: Goal: Ability to describe self-care measures that may prevent or decrease complications (Diabetes Survival Skills Education) will improve Outcome:  Progressing Goal: Individualized Educational Video(s) Outcome: Progressing   Problem: Coping: Goal: Ability to adjust to condition or change in health will improve Outcome: Progressing   Problem: Fluid Volume: Goal: Ability to maintain a balanced intake and output will improve Outcome: Progressing   Problem: Health Behavior/Discharge Planning: Goal: Ability to identify and utilize available resources and services will improve Outcome: Progressing Goal: Ability to manage health-related needs will improve Outcome: Progressing   Problem: Metabolic: Goal: Ability to maintain appropriate glucose levels will improve Outcome: Progressing   Problem: Nutritional: Goal: Maintenance of adequate nutrition will improve Outcome: Progressing Goal: Progress toward achieving an optimal weight will improve Outcome: Progressing   Problem: Skin Integrity: Goal: Risk for impaired skin integrity will decrease Outcome: Progressing   Problem: Tissue Perfusion: Goal: Adequacy of tissue perfusion will improve Outcome: Progressing

## 2023-05-26 NOTE — Progress Notes (Addendum)
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  He was noted to have hypotension which was thought to be prerenal azotemia but imaging showed obstructive uropathy from a R ureteral stone with solitary R kidney.  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  He underwent cystoscopy on 6/6 with R ureteral stent placement with improvements noted in renal function thus far.  Assessment & Plan:   Principal Problem:   Acute kidney failure (HCC) Active Problems:   Obstructive uropathy   Pituitary insufficiency (HCC)   Essential hypertension   Paralysis of both lower limbs (HCC)   Diabetes mellitus type 2 in nonobese (HCC)   Hyperlipidemia   Mood disorder (HCC)  Assessment and Plan:   Acute kidney failure (HCC) -Initially thought to be pre-renal due to hypotension in ED, now suspected to be related more to obstructive uropathy from an obstructing ureteral stone in the R ureter in setting of R solitary functioning kidney. -Urologist suspicious L kidney perhaps not functional more chronically due to severe staghorn calculus disease seen on the CT scan. -Nephrology consulted at [facility], updated on CT results and plan for transfer and urologic intervention -Pt transferred to [facility] at urology request -Patient noted to have improvements in renal function status post stent placement 6/6 -Switch bicarb drip to LR 6/7 -Repeat BMP now to determine if bicarb can be transitioned to LR   Obstructive uropathy -As discussed above. -s/p R ureteral stent placement with improvements in renal function thus far -Continue Rocephin for now with gram-negative rods noted on urine culture with final results pending -Continue home finasteride   Pituitary insufficiency (HCC) -Listed on his  past problem list but not on any chronic medications (ie synthroid, steroids, etc) for this on prior med recs. -BP improved after IVF in ED. -Renal failure now felt to be primarily due to obstructive uropathy following the CT scan results.   Diabetes mellitus type 2 in nonobese (HCC) -A1c 6.2% -hold Glucophage, Farxiga -Cover with sensitive-scale SSI    Paralysis of both lower limbs (HCC) -Chronic and baseline -Reports indwelling foley at baseline   Essential hypertension -Patient is on lisinopril which needs to be held for soft BPs as well renal failure   Mood disorder (HCC) -Patient with an apparent psychotic disorder based on medications, but it is not clear who follows him for this condition -Continue Cogentin, haloperidol, sertraline   Hyperlipidemia -Continue simvastatin   DVT prophylaxis:SCDs Code Status: Full Family Communication: Discussed with aunt Rayfield Citizen 6/7 Disposition Plan:  Status is: Inpatient Remains inpatient appropriate because: Need for IV medications.   Consultants:  Urology Nephrology  Procedures:  Cystoscopy with right ureteral stent placement 6/6  Antimicrobials:  Anti-infectives (From admission, onward)    Start     Dose/Rate Route Frequency Ordered Stop   05/25/23 2300  cefTRIAXone (ROCEPHIN) 1 g in sodium chloride 0.9 % 100 mL IVPB        1 g 200 mL/hr over 30 Minutes Intravenous Every 24 hours 05/25/23 0210     05/24/23 2345  cefTRIAXone (ROCEPHIN) 2 g in sodium chloride 0.9 % 100 mL IVPB        2 g 200 mL/hr over 30 Minutes Intravenous  Once 05/24/23 2333 05/25/23 0028       Subjective:  Patient seen and evaluated today with no new acute complaints or concerns. No acute concerns or events noted overnight.  Objective: Vitals:   05/26/23 0200 05/26/23 0400 05/26/23 0436 05/26/23 0600  BP: 137/68 (!) 106/55  (!) 101/48  Pulse: 63 67 97 73  Resp: (!) 21 (!) 22 (!) 23 20  Temp:  98.3 F (36.8 C)    TempSrc:  Oral    SpO2: 97%  94% (!) 66% 98%  Weight:      Height:        Intake/Output Summary (Last 24 hours) at 05/26/2023 0830 Last data filed at 05/26/2023 0814 Gross per 24 hour  Intake 3646.15 ml  Output 4950 ml  Net -1303.85 ml   Filed Weights   05/24/23 1655 05/25/23 0129  Weight: 99.8 kg 79.6 kg    Examination:  General exam: Appears calm and comfortable  Respiratory system: Clear to auscultation. Respiratory effort normal.  Nasal cannula 2 L Cardiovascular system: S1 & S2 heard, RRR.  Gastrointestinal system: Abdomen is soft Central nervous system: Alert and awake Extremities: No edema Skin: No significant lesions noted Psychiatry: Flat affect. Foley with clear, yellow urine output    Data Reviewed: I have personally reviewed following labs and imaging studies  CBC: Recent Labs  Lab 05/24/23 1748 05/24/23 1906 05/25/23 0301 05/26/23 0235  WBC 13.0*  --  8.1 9.4  NEUTROABS 11.1*  --   --  6.8  HGB 10.6* 12.9* 8.7* 8.0*  HCT 34.6* 38.0* 27.7* 25.0*  MCV 64.4*  --  64.4* 63.8*  PLT 563*  --  496* 462*   Basic Metabolic Panel: Recent Labs  Lab 05/24/23 1748 05/24/23 1906 05/25/23 0301 05/25/23 1458 05/26/23 0235  NA 130* 132* 130* 136 138  K 5.3* 5.3* 4.5 5.1 3.7  CL 100  --  99 102 98  CO2 12*  --  18* 17* 26  GLUCOSE 87  --  83 116* 130*  BUN 123*  --  107* 97* 89*  CREATININE 11.16*  --  8.94* 7.15* 5.22*  CALCIUM 9.3  --  8.3* 7.9* 7.8*   GFR: Estimated Creatinine Clearance: 16.6 mL/min (A) (by C-G formula based on SCr of 5.22 mg/dL (H)). Liver Function Tests: Recent Labs  Lab 05/24/23 1748  AST 10*  ALT 13  ALKPHOS 81  BILITOT 0.3  PROT 9.1*  ALBUMIN 3.2*   No results for input(s): "LIPASE", "AMYLASE" in the last 168 hours. No results for input(s): "AMMONIA" in the last 168 hours. Coagulation Profile: Recent Labs  Lab 05/24/23 1815  INR 1.2   Cardiac Enzymes: No results for input(s): "CKTOTAL", "CKMB", "CKMBINDEX", "TROPONINI" in the last 168  hours. BNP (last 3 results) No results for input(s): "PROBNP" in the last 8760 hours. HbA1C: Recent Labs    05/25/23 0301  HGBA1C 6.2*   CBG: Recent Labs  Lab 05/25/23 1505 05/25/23 1805 05/25/23 2305 05/26/23 0442 05/26/23 0745  GLUCAP 103* 100* 130* 115* 109*   Lipid Profile: No results for input(s): "CHOL", "HDL", "LDLCALC", "TRIG", "CHOLHDL", "LDLDIRECT" in the last 72 hours. Thyroid Function Tests: No results for input(s): "TSH", "T4TOTAL", "FREET4", "T3FREE", "THYROIDAB" in the last 72 hours. Anemia Panel: No results for input(s): "VITAMINB12", "FOLATE", "FERRITIN", "TIBC", "IRON", "RETICCTPCT" in the last 72 hours. Sepsis Labs: Recent Labs  Lab 05/24/23 1740  LATICACIDVEN 1.2    Recent Results (from the past 240 hour(s))  Urine Culture     Status: Abnormal (Preliminary result)   Collection Time: 05/24/23  5:03  PM   Specimen: Urine, Random  Result Value Ref Range Status   Specimen Description URINE, RANDOM  Final   Special Requests NONE Reflexed from [id number]  Final   Culture (A)  Final    80,000 COLONIES/mL CITROBACTER FREUNDII CULTURE REINCUBATED FOR BETTER GROWTH Performed at [facility], [address], [place], [place] [address]    Report Status PENDING  Incomplete   Organism ID, Bacteria CITROBACTER FREUNDII (A)  Final      Susceptibility   Citrobacter freundii - MIC*    CEFEPIME <=0.12 SENSITIVE Sensitive     CEFTRIAXONE <=0.25 SENSITIVE Sensitive     CIPROFLOXACIN <=0.25 SENSITIVE Sensitive     GENTAMICIN <=1 SENSITIVE Sensitive     IMIPENEM 1 SENSITIVE Sensitive     NITROFURANTOIN <=16 SENSITIVE Sensitive     TRIMETH/SULFA <=20 SENSITIVE Sensitive     PIP/TAZO <=4 SENSITIVE Sensitive     * 80,000 COLONIES/mL CITROBACTER FREUNDII  Culture, blood (Routine x 2)     Status: None (Preliminary result)   Collection Time: 05/24/23  5:40 PM   Specimen: BLOOD  Result Value Ref Range Status   Specimen Description BLOOD SITE NOT SPECIFIED  Final    Special Requests   Final    BOTTLES DRAWN AEROBIC AND ANAEROBIC Blood Culture results may not be optimal due to an inadequate volume of blood received in culture bottles   Culture   Final    NO GROWTH < 24 HOURS Performed at [facility], [address], [place], [place] [address]    Report Status PENDING  Incomplete  Culture, blood (Routine x 2)     Status: None (Preliminary result)   Collection Time: 05/24/23  7:01 PM   Specimen: BLOOD  Result Value Ref Range Status   Specimen Description BLOOD SITE NOT SPECIFIED  Final   Special Requests   Final    BOTTLES DRAWN AEROBIC AND ANAEROBIC Blood Culture results may not be optimal due to an inadequate volume of blood received in culture bottles   Culture   Final    NO GROWTH < 12 HOURS Performed at [facility], [address] [place], [place] [address]    Report Status PENDING  Incomplete  MRSA Next Gen by PCR, Nasal     Status: None   Collection Time: 05/25/23  1:27 AM   Specimen: Nasal Mucosa; Nasal Swab  Result Value Ref Range Status   MRSA by PCR Next Gen NOT DETECTED NOT DETECTED Final    Comment: (NOTE) The GeneXpert MRSA Assay (FDA approved for NASAL specimens only), is one component of a comprehensive MRSA colonization surveillance program. It is not intended to diagnose MRSA infection nor to guide or monitor treatment for MRSA infections. Test performance is not FDA approved in patients less than 42 years old. Performed at [facility], [address] [place], [place] [address]          Radiology Studies: DG C-Arm 1-60 Min-No Report  Result Date: 05/25/2023 Fluoroscopy was utilized by the requesting physician.  No radiographic interpretation.   CT ABDOMEN PELVIS WO CONTRAST  Result Date: 05/24/2023 CLINICAL DATA:  Right lower quadrant abdominal pain, hypotension, nausea, emesis. EXAM: CT ABDOMEN AND PELVIS WITHOUT CONTRAST TECHNIQUE: Multidetector CT imaging of the abdomen and pelvis  was performed following the standard protocol without IV contrast. RADIATION DOSE REDUCTION: This exam was performed according to the departmental dose-optimization program which includes automated exposure control, adjustment of the mA and/or  kV according to patient size and/or use of iterative reconstruction technique. COMPARISON:  01/06/2020. FINDINGS: Lower chest: A stable 3 mm subpleural nodule is noted in the left lower lobe, axial image 18, likely benign. Mild atelectasis is present at the lung bases. Hepatobiliary: No focal liver abnormality is seen. No gallstones, gallbladder wall thickening, or biliary dilatation. Pancreas: Unremarkable. No pancreatic ductal dilatation or surrounding inflammatory changes. Spleen: Normal in size without focal abnormality. Adrenals/Urinary Tract: The adrenal glands are within normal limits. Cyst is noted in the lower pole of the right kidney. Renal calculi are present bilaterally. 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 There is moderate hydroureteronephrosis on the right with a 1 cm calculus in the distal right ureter in the pelvis. No obstructive uropathy on the left. Mild perinephric fat stranding is seen bilaterally and periureteral fat stranding is noted on the right. The bladder is within normal limits. Stomach/Bowel: Stomach is within normal limits. Appendix appears normal. No evidence of bowel wall thickening, distention, or inflammatory changes. No free air or pneumatosis. A few scattered diverticula are present along the colon without evidence of diverticulitis. Vascular/Lymphatic: The aorta is normal in caliber. An IVC filter is noted. A few prominent lymph nodes are noted in the upper abdomen at the level of the kidneys and may be reactive. Reproductive: Not seen due to limited field of view. Other: No abdominopelvic ascites. A small fat containing umbilical hernia is present. Musculoskeletal: Degenerative changes are present in the thoracolumbar spine. Spinal  fusion hardware is noted L2. No acute osseous abnormality IMPRESSION: 1. Moderate obstructive uropathy on the right with a 1 cm calculus in the distal right ureter in the pelvis. 2. 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 There is perinephric fat stranding bilaterally and along the proximal right ureter which may be infectious or inflammatory. 3. Remaining incidental findings as described above. Electronically Signed   By: Thornell Sartorius M.D.   On: 05/24/2023 22:00   DG Chest 2 View  Result Date: 05/24/2023 CLINICAL DATA:  Suspected sepsis. Nausea vomiting and diarrhea with right lower quadrant pain EXAM: CHEST - 2 VIEW COMPARISON:  X-ray 08/25/2018 FINDINGS: No consolidation, pneumothorax or effusion. Normal cardiopericardial silhouette without edema. Film is under penetrated. Fixation hardware along the lumbar spine at the edge of the imaging field. IMPRESSION: No acute cardiopulmonary disease Electronically Signed   By: Karen Kays M.D.   On: 05/24/2023 17:49        Scheduled Meds:  benztropine  0.5 mg Oral Daily   Chlorhexidine Gluconate Cloth  6 each Topical Daily   finasteride  5 mg Oral Daily   haloperidol  5 mg Oral Daily   insulin aspart  0-9 Units Subcutaneous Q4H   pantoprazole  40 mg Oral Daily   sertraline  200 mg Oral Daily   simvastatin  40 mg Oral q1800   Continuous Infusions:  cefTRIAXone (ROCEPHIN)  IV Stopped (05/25/23 2334)   sodium bicarbonate 150 mEq in sterile water 1,150 mL infusion 150 mL/hr at 05/26/23 0814     LOS: 2 days    Time spent: 35 minutes    Andre Mcnease D Sherryll Burger, DO [facility]  If 7PM-7AM, please contact night-coverage [email or web address] 05/26/2023, 8:30 AM

## 2023-05-26 NOTE — Progress Notes (Signed)
Patient ID: Andre Clements, male   DOB: [birth year], 52 y.o.   MRN: [id number] S: No new complaints. O:BP 123/66   Pulse 71   Temp 97.9 F (36.6 C) (Axillary)   Resp (!) 23   Ht 5\' 8"  (1.727 m)   Wt 79.6 kg   SpO2 100%   BMI 26.68 kg/m   Intake/Output Summary (Last 24 hours) at 05/26/2023 1025 Last data filed at 05/26/2023 0814 Gross per 24 hour  Intake 3646.15 ml  Output 4950 ml  Net -1303.85 ml   Intake/Output: I/O last 3 completed shifts: In: 6602 [P.O.:252; I.V.:4150; IV Piggyback:2200] Out: 5700 [Urine:5700]  Intake/Output this shift:  Total I/O In: 1140.2 [I.V.:1140.2] Out: -  Weight change:  Gen: NAD CVS: RRR Resp:CTA Abd: +BS, soft, NT/ND Ext: no edema  Recent Labs  Lab 05/24/23 1748 05/24/23 1906 05/25/23 0301 05/25/23 1458 05/26/23 0235  NA 130* 132* 130* 136 138  K 5.3* 5.3* 4.5 5.1 3.7  CL 100  --  99 102 98  CO2 12*  --  18* 17* 26  GLUCOSE 87  --  83 116* 130*  BUN 123*  --  107* 97* 89*  CREATININE 11.16*  --  8.94* 7.15* 5.22*  ALBUMIN 3.2*  --   --   --   --   CALCIUM 9.3  --  8.3* 7.9* 7.8*  AST 10*  --   --   --   --   ALT 13  --   --   --   --    Liver Function Tests: Recent Labs  Lab 05/24/23 1748  AST 10*  ALT 13  ALKPHOS 81  BILITOT 0.3  PROT 9.1*  ALBUMIN 3.2*   No results for input(s): "LIPASE", "AMYLASE" in the last 168 hours. No results for input(s): "AMMONIA" in the last 168 hours. CBC: Recent Labs  Lab 05/24/23 1748 05/24/23 1906 05/25/23 0301 05/26/23 0235  WBC 13.0*  --  8.1 9.4  NEUTROABS 11.1*  --   --  6.8  HGB 10.6* 12.9* 8.7* 8.0*  HCT 34.6* 38.0* 27.7* 25.0*  MCV 64.4*  --  64.4* 63.8*  PLT 563*  --  496* 462*   Cardiac Enzymes: No results for input(s): "CKTOTAL", "CKMB", "CKMBINDEX", "TROPONINI" in the last 168 hours. CBG: Recent Labs  Lab 05/25/23 1505 05/25/23 1805 05/25/23 2305 05/26/23 0442 05/26/23 0745  GLUCAP 103* 100* 130* 115* 109*    Iron Studies: No results for input(s): "IRON",  "TIBC", "TRANSFERRIN", "FERRITIN" in the last 72 hours. Studies/Results: DG C-Arm 1-60 Min-No Report  Result Date: 05/25/2023 Fluoroscopy was utilized by the requesting physician.  No radiographic interpretation.   CT ABDOMEN PELVIS WO CONTRAST  Result Date: 05/24/2023 CLINICAL DATA:  Right lower quadrant abdominal pain, hypotension, nausea, emesis. EXAM: CT ABDOMEN AND PELVIS WITHOUT CONTRAST TECHNIQUE: Multidetector CT imaging of the abdomen and pelvis was performed following the standard protocol without IV contrast. RADIATION DOSE REDUCTION: This exam was performed according to the departmental dose-optimization program which includes automated exposure control, adjustment of the mA and/or kV according to patient size and/or use of iterative reconstruction technique. COMPARISON:  01/06/2020. FINDINGS: Lower chest: A stable 3 mm subpleural nodule is noted in the left lower lobe, axial image 18, likely benign. Mild atelectasis is present at the lung bases. Hepatobiliary: No focal liver abnormality is seen. No gallstones, gallbladder wall thickening, or biliary dilatation. Pancreas: Unremarkable. No pancreatic ductal dilatation or surrounding inflammatory changes. Spleen: Normal in size without focal abnormality.  Adrenals/Urinary Tract: The adrenal glands are within normal limits. Cyst is noted in the lower pole of the right kidney. Renal calculi are present bilaterally. 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 There is moderate hydroureteronephrosis on the right with a 1 cm calculus in the distal right ureter in the pelvis. No obstructive uropathy on the left. Mild perinephric fat stranding is seen bilaterally and periureteral fat stranding is noted on the right. The bladder is within normal limits. Stomach/Bowel: Stomach is within normal limits. Appendix appears normal. No evidence of bowel wall thickening, distention, or inflammatory changes. No free air or pneumatosis. A few scattered diverticula are  present along the colon without evidence of diverticulitis. Vascular/Lymphatic: The aorta is normal in caliber. An IVC filter is noted. A few prominent lymph nodes are noted in the upper abdomen at the level of the kidneys and may be reactive. Reproductive: Not seen due to limited field of view. Other: No abdominopelvic ascites. A small fat containing umbilical hernia is present. Musculoskeletal: Degenerative changes are present in the thoracolumbar spine. Spinal fusion hardware is noted L2. No acute osseous abnormality IMPRESSION: 1. Moderate obstructive uropathy on the right with a 1 cm calculus in the distal right ureter in the pelvis. 2. 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 There is perinephric fat stranding bilaterally and along the proximal right ureter which may be infectious or inflammatory. 3. Remaining incidental findings as described above. Electronically Signed   By: Thornell Sartorius M.D.   On: 05/24/2023 22:00   DG Chest 2 View  Result Date: 05/24/2023 CLINICAL DATA:  Suspected sepsis. Nausea vomiting and diarrhea with right lower quadrant pain EXAM: CHEST - 2 VIEW COMPARISON:  X-ray 08/25/2018 FINDINGS: No consolidation, pneumothorax or effusion. Normal cardiopericardial silhouette without edema. Film is under penetrated. Fixation hardware along the lumbar spine at the edge of the imaging field. IMPRESSION: No acute cardiopulmonary disease Electronically Signed   By: Karen Kays M.D.   On: 05/24/2023 17:49    benztropine  0.5 mg Oral Daily   Chlorhexidine Gluconate Cloth  6 each Topical Daily   finasteride  5 mg Oral Daily   haloperidol  5 mg Oral Daily   insulin aspart  0-9 Units Subcutaneous Q4H   pantoprazole  40 mg Oral Daily   sertraline  200 mg Oral Daily   simvastatin  40 mg Oral q1800    BMET    Component Value Date/Time   NA 138 05/26/2023 0235   K 3.7 05/26/2023 0235   CL 98 05/26/2023 0235   CO2 26 05/26/2023 0235   GLUCOSE 130 (H) 05/26/2023 0235    BUN 89 (H) 05/26/2023 0235   CREATININE 5.22 (H) 05/26/2023 0235   CALCIUM 7.8 (L) 05/26/2023 0235   GFRNONAA 13 (L) 05/26/2023 0235   GFRAA >60 08/27/2018 0505   CBC    Component Value Date/Time   WBC 9.4 05/26/2023 0235   RBC 3.92 (L) 05/26/2023 0235   HGB 8.0 (L) 05/26/2023 0235   HCT 25.0 (L) 05/26/2023 0235   PLT 462 (H) 05/26/2023 0235   MCV 63.8 (L) 05/26/2023 0235   MCH 20.4 (L) 05/26/2023 0235   MCHC 32.0 05/26/2023 0235   RDW 17.1 (H) 05/26/2023 0235   LYMPHSABS 1.7 05/26/2023 0235   MONOABS 0.7 05/26/2023 0235   EOSABS 0.1 05/26/2023 0235   BASOSABS 0.0 05/26/2023 0235    History of Present Illness: Andre Clements is a/an 52 y.o. male with a past medical history of DM2,  HTN, BPD, h/o spinal cord injury with paralysis of BLE, urinary incontinence, anxiety/depression, HLD who presents to [facility]  with N/V/D, subjective fevers, decreased appetite, fatigue. Apparently has been having chronic dark bloody stools and is following with GI for this. However, there has been reports of hematuria. Found to have a Cr of 11.16, BUN 123, K 5.3, Na 130, Hgb 10.6 (repeat 12.9), WBC 13, pH 7.09. s/p 2L LR, started on bicarb gtt after discussing with ER.    Assessment/Plan:   AKI- multifactorial with h/o volume depletion and concomitant ACE-inhibition and SGLT-2 inhibitor, as well as bilateral hydronephrosis due to staghorn calculi on the left and obstructing stone on the right.  SCr peaked at 11.16.  Pt with unknown baseline Scr since 2021 when it was 0.79, so unclear on the chronicity of his kidney disease.  Scr has started to improve prior to stent placement.  Hopefully will continue to improve, however doubtful that he will get back to normal with staghorn calculus in the left kidney.  If Scr does not improve with right stent alone, plan for left PNT placement by IR is next step per Urology.  Continue to hold lisinopril and Farxiga.  No indication for dialysis at this time.  Scr now down to  5.22. Will continue to follow closely.  Avoid nephrotoxic medications including NSAIDs and iodinated intravenous contrast exposure unless the latter is absolutely indicated.   Preferred narcotic agents for pain control are hydromorphone, fentanyl, and methadone. Morphine should not be used.  Avoid Baclofen and avoid oral sodium phosphate and magnesium citrate based laxatives / bowel preps.  Continue strict Input and Output monitoring.  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  S/p cysto and right ureteral stent placement this morning.  Plan per Urology. Microcytic anemia - will check iron stores and follow. Metabolic acidosis - presumably due to #1.  Resolved with isotonic bicarb drip.  Will stop IVF's and follow. Hyponatremia - likely due to AKI but will follow.  He does have h/o pituitary insufficiency per records, but not on any meds.  No sodium levels since 2021. Resolved with IV NaHCO3.   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 HTN - hold lisinopril.  Irena Cords, MD [facility]

## 2023-05-26 NOTE — Progress Notes (Signed)
1 Day Post-Op Subjective: 52 year old male previously followed by Dr. Liliane Shi.  Right ureteral stent with hydronephrosis.  S/p right ureteral stent.  Left staghorn calculus.  6/7: First time meeting patient.  He is alert, oriented, no distress.  We reviewed his case and the progression of his renal function.  He remains in the ICU at this time.  Objective: Vital signs in last 24 hours: Temp:  [97.9 F (36.6 C)-98.5 F (36.9 C)] 97.9 F (36.6 C) (06/07 0800) Pulse Rate:  [57-97] 71 (06/07 0800) Resp:  [17-27] 23 (06/07 0800) BP: (90-146)/(43-79) 123/66 (06/07 0800) SpO2:  [66 %-100 %] 100 % (06/07 0800)  Intake/Output from previous day: 06/06 0701 - 06/07 0700 In: 3264.9 [P.O.:252; I.V.:2912.9; IV Piggyback:100] Out: 4950 [Urine:4950]  Intake/Output this shift: Total I/O In: 1140.2 [I.V.:1140.2] Out: -   Physical Exam:  General: Alert and oriented CV: No cyanosis Lungs: equal chest rise Abdomen: Soft, NTND, no rebound or guarding Gu: Foley catheter in place draining clear yellow urine.  Lab Results: Recent Labs    05/24/23 1906 05/25/23 0301 05/26/23 0235  HGB 12.9* 8.7* 8.0*  HCT 38.0* 27.7* 25.0*   BMET Recent Labs    05/25/23 1458 05/26/23 0235  NA 136 138  K 5.1 3.7  CL 102 98  CO2 17* 26  GLUCOSE 116* 130*  BUN 97* 89*  CREATININE 7.15* 5.22*  CALCIUM 7.9* 7.8*     Studies/Results: DG C-Arm 1-60 Min-No Report  Result Date: 05/25/2023 Fluoroscopy was utilized by the requesting physician.  No radiographic interpretation.   CT ABDOMEN PELVIS WO CONTRAST  Result Date: 05/24/2023 CLINICAL DATA:  Right lower quadrant abdominal pain, hypotension, nausea, emesis. EXAM: CT ABDOMEN AND PELVIS WITHOUT CONTRAST TECHNIQUE: Multidetector CT imaging of the abdomen and pelvis was performed following the standard protocol without IV contrast. RADIATION DOSE REDUCTION: This exam was performed according to the departmental dose-optimization program which includes  automated exposure control, adjustment of the mA and/or kV according to patient size and/or use of iterative reconstruction technique. COMPARISON:  01/06/2020. FINDINGS: Lower chest: A stable 3 mm subpleural nodule is noted in the left lower lobe, axial image 18, likely benign. Mild atelectasis is present at the lung bases. Hepatobiliary: No focal liver abnormality is seen. No gallstones, gallbladder wall thickening, or biliary dilatation. Pancreas: Unremarkable. No pancreatic ductal dilatation or surrounding inflammatory changes. Spleen: Normal in size without focal abnormality. Adrenals/Urinary Tract: The adrenal glands are within normal limits. Cyst is noted in the lower pole of the right kidney. Renal calculi are present bilaterally. 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 There is moderate hydroureteronephrosis on the right with a 1 cm calculus in the distal right ureter in the pelvis. No obstructive uropathy on the left. Mild perinephric fat stranding is seen bilaterally and periureteral fat stranding is noted on the right. The bladder is within normal limits. Stomach/Bowel: Stomach is within normal limits. Appendix appears normal. No evidence of bowel wall thickening, distention, or inflammatory changes. No free air or pneumatosis. A few scattered diverticula are present along the colon without evidence of diverticulitis. Vascular/Lymphatic: The aorta is normal in caliber. An IVC filter is noted. A few prominent lymph nodes are noted in the upper abdomen at the level of the kidneys and may be reactive. Reproductive: Not seen due to limited field of view. Other: No abdominopelvic ascites. A small fat containing umbilical hernia is present. Musculoskeletal: Degenerative changes are present in the thoracolumbar spine. Spinal fusion hardware is noted L2. No acute osseous  abnormality IMPRESSION: 1. Moderate obstructive uropathy on the right with a 1 cm calculus in the distal right ureter in the pelvis. 2.  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 There is perinephric fat stranding bilaterally and along the proximal right ureter which may be infectious or inflammatory. 3. Remaining incidental findings as described above. Electronically Signed   By: Thornell Sartorius M.D.   On: 05/24/2023 22:00   DG Chest 2 View  Result Date: 05/24/2023 CLINICAL DATA:  Suspected sepsis. Nausea vomiting and diarrhea with right lower quadrant pain EXAM: CHEST - 2 VIEW COMPARISON:  X-ray 08/25/2018 FINDINGS: No consolidation, pneumothorax or effusion. Normal cardiopericardial silhouette without edema. Film is under penetrated. Fixation hardware along the lumbar spine at the edge of the imaging field. IMPRESSION: No acute cardiopulmonary disease Electronically Signed   By: Karen Kays M.D.   On: 05/24/2023 17:49    Assessment/Plan: # Obstructive uropathy # AKI  Bilateral nephrolithiasis with staghorn calculus on the left. S/p right ureteral stent with Dr. Laverle Patter on 05/25/2023. Presenting serum creatinine 11.16, now down to 5.22 Remains in ICU for the time being. Definitive stone management to be performed on an outpatient basis. Will follow along peripherally and help schedule him as he nears discharge.    LOS: 2 days   Elmon Kirschner, NP [facility] Pager: [phone]  05/26/2023, 9:58 AM

## 2023-05-27 DIAGNOSIS — N179 Acute kidney failure, unspecified: Secondary | ICD-10-CM | POA: Diagnosis not present

## 2023-05-27 LAB — RENAL FUNCTION PANEL
Albumin: 2.5 g/dL — ABNORMAL LOW (ref 3.5–5.0)
Anion gap: 12 (ref 5–15)
BUN: 55 mg/dL — ABNORMAL HIGH (ref 6–20)
CO2: 29 mmol/L (ref 22–32)
Calcium: 7.9 mg/dL — ABNORMAL LOW (ref 8.9–10.3)
Chloride: 97 mmol/L — ABNORMAL LOW (ref 98–111)
Creatinine, Ser: 2.85 mg/dL — ABNORMAL HIGH (ref 0.61–1.24)
GFR, Estimated: 26 mL/min — ABNORMAL LOW (ref >60)
Glucose, Bld: 97 mg/dL (ref 70–99)
Phosphorus: 3.6 mg/dL (ref 2.5–4.6)
Potassium: 3.7 mmol/L (ref 3.5–5.1)
Sodium: 138 mmol/L (ref 135–145)

## 2023-05-27 LAB — CBC
HCT: 29.4 % — ABNORMAL LOW (ref 39.0–52.0)
Hemoglobin: 9.1 g/dL — ABNORMAL LOW (ref 13.0–17.0)
MCH: 19.9 pg — ABNORMAL LOW (ref 26.0–34.0)
MCHC: 31 g/dL (ref 30.0–36.0)
MCV: 64.3 fL — ABNORMAL LOW (ref 80.0–100.0)
Platelets: 422 10*3/uL — ABNORMAL HIGH (ref 150–400)
RBC: 4.57 MIL/uL (ref 4.22–5.81)
RDW: 16.6 % — ABNORMAL HIGH (ref 11.5–15.5)
WBC: 8.4 10*3/uL (ref 4.0–10.5)
nRBC: 0 % (ref 0.0–0.2)

## 2023-05-27 LAB — GLUCOSE, CAPILLARY
Glucose-Capillary: 100 mg/dL — ABNORMAL HIGH (ref 70–99)
Glucose-Capillary: 100 mg/dL — ABNORMAL HIGH (ref 70–99)
Glucose-Capillary: 101 mg/dL — ABNORMAL HIGH (ref 70–99)
Glucose-Capillary: 109 mg/dL — ABNORMAL HIGH (ref 70–99)
Glucose-Capillary: 109 mg/dL — ABNORMAL HIGH (ref 70–99)
Glucose-Capillary: 137 mg/dL — ABNORMAL HIGH (ref 70–99)
Glucose-Capillary: 98 mg/dL (ref 70–99)

## 2023-05-27 LAB — URINE CULTURE

## 2023-05-27 LAB — IRON AND TIBC
Iron: 25 ug/dL — ABNORMAL LOW (ref 45–182)
Saturation Ratios: 10 % — ABNORMAL LOW (ref 17.9–39.5)
TIBC: 239 ug/dL — ABNORMAL LOW (ref 250–450)
UIBC: 214 ug/dL

## 2023-05-27 LAB — MAGNESIUM: Magnesium: 1.7 mg/dL (ref 1.7–2.4)

## 2023-05-27 NOTE — Progress Notes (Signed)
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  He was noted to have hypotension which was thought to be prerenal azotemia but imaging showed obstructive uropathy from a R ureteral stone with solitary R kidney.  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  He underwent cystoscopy on 6/6 with R ureteral stent placement with improvements noted in renal function thus far.  Assessment & Plan:   Principal Problem:   Acute kidney failure (HCC) Active Problems:   Obstructive uropathy   Pituitary insufficiency (HCC)   Essential hypertension   Paralysis of both lower limbs (HCC)   Diabetes mellitus type 2 in nonobese (HCC)   Hyperlipidemia   Mood disorder (HCC)  Assessment and Plan:   Acute kidney failure (HCC) -Initially thought to be pre-renal due to hypotension in ED, now suspected to be related more to obstructive uropathy from an obstructing ureteral stone in the R ureter in setting of R solitary functioning kidney. -Now with improvements in renal function noted after stent placement 6/6 -Continue to monitor creatinine trend -Continue to avoid lisinopril and Farxiga -Repeat BMP in a.m. and anticipate discharge once renal function has returned to baseline which was near 0.79 in 2021.   Obstructive uropathy with Citrobacter and Proteus UTI -As discussed above. -s/p R ureteral stent placement with improvements in renal function thus far -Continue Rocephin for now day 3 -Continue home finasteride   Pituitary insufficiency (HCC) -Listed on his past problem list but not on any chronic medications (ie synthroid, steroids, etc) for this on prior med recs. -BP improved after IVF in ED. -Renal failure now felt to be primarily due to obstructive uropathy following  the CT scan results.   Diabetes mellitus type 2 in nonobese (HCC) -A1c 6.2% -hold Glucophage, Farxiga -Cover with sensitive-scale SSI    Paralysis of both lower limbs (HCC) -Chronic and baseline -Reports indwelling foley at baseline   Essential hypertension -Patient is on lisinopril which needs to be held for soft BPs as well renal failure -Plan to avoid lisinopril and Farxiga for 1-2 months.   Mood disorder (HCC) -Patient with an apparent psychotic disorder based on medications, but it is not clear who follows him for this condition -Continue Cogentin, haloperidol, sertraline   Hyperlipidemia -Continue simvastatin   DVT prophylaxis:SCDs Code Status: Full Family Communication: Discussed with aunt [place] Citizen 6/7 Disposition Plan:  Status is: Inpatient Remains inpatient appropriate because: Need for IV medications.   Consultants:  Urology Nephrology  Procedures:  Cystoscopy with right ureteral stent placement 6/6  Antimicrobials:  Anti-infectives (From admission, onward)    Start     Dose/Rate Route Frequency Ordered Stop   05/25/23 2300  cefTRIAXone (ROCEPHIN) 1 g in sodium chloride 0.9 % 100 mL IVPB        1 g 200 mL/hr over 30 Minutes Intravenous Every 24 hours 05/25/23 0210     05/24/23 2345  cefTRIAXone (ROCEPHIN) 2 g in sodium chloride 0.9 % 100 mL IVPB        2 g 200 mL/hr over 30 Minutes Intravenous  Once 05/24/23 2333 05/25/23 0028       Subjective: Patient seen and evaluated today with no new acute complaints or concerns. No acute concerns or events noted overnight.  Continues to have improvement in renal  function noted with good urine output.  Objective: Vitals:   05/26/23 1535 05/26/23 2009 05/27/23 0008 05/27/23 0428  BP: 122/72 114/73 118/66 (!) 152/87  Pulse: 65 72 (!) 56 79  Resp:  18 18 18   Temp: 97.7 F (36.5 C) 98.5 F (36.9 C) 98.4 F (36.9 C) 98.3 F (36.8 C)  TempSrc: Oral Oral Oral Oral  SpO2: 100% 100% 100% 96%  Weight:       Height:        Intake/Output Summary (Last 24 hours) at 05/27/2023 1004 Last data filed at 05/27/2023 0840 Gross per 24 hour  Intake 1088.94 ml  Output 2100 ml  Net -1011.06 ml   Filed Weights   05/24/23 1655 05/25/23 0129  Weight: 99.8 kg 79.6 kg    Examination:  General exam: Appears calm and comfortable  Respiratory system: Clear to auscultation. Respiratory effort normal.  Cardiovascular system: S1 & S2 heard, RRR.  Gastrointestinal system: Abdomen is soft Central nervous system: Alert and awake Extremities: No edema Skin: No significant lesions noted Psychiatry: Flat affect. Foley with clear, yellow urine output    Data Reviewed: I have personally reviewed following labs and imaging studies  CBC: Recent Labs  Lab 05/24/23 1748 05/24/23 1906 05/25/23 0301 05/26/23 0235 05/27/23 0658  WBC 13.0*  --  8.1 9.4 8.4  NEUTROABS 11.1*  --   --  6.8  --   HGB 10.6* 12.9* 8.7* 8.0* 9.1*  HCT 34.6* 38.0* 27.7* 25.0* 29.4*  MCV 64.4*  --  64.4* 63.8* 64.3*  PLT 563*  --  496* 462* 422*   Basic Metabolic Panel: Recent Labs  Lab 05/24/23 1748 05/24/23 1906 05/25/23 0301 05/25/23 1458 05/26/23 0235 05/27/23 0658  NA 130* 132* 130* 136 138 138  K 5.3* 5.3* 4.5 5.1 3.7 3.7  CL 100  --  99 102 98 97*  CO2 12*  --  18* 17* 26 29  GLUCOSE 87  --  83 116* 130* 97  BUN 123*  --  107* 97* 89* 55*  CREATININE 11.16*  --  8.94* 7.15* 5.22* 2.85*  CALCIUM 9.3  --  8.3* 7.9* 7.8* 7.9*  MG  --   --   --   --   --  1.7  PHOS  --   --   --   --   --  3.6   GFR: Estimated Creatinine Clearance: 30.3 mL/min (A) (by C-G formula based on SCr of 2.85 mg/dL (H)). Liver Function Tests: Recent Labs  Lab 05/24/23 1748 05/27/23 0658  AST 10*  --   ALT 13  --   ALKPHOS 81  --   BILITOT 0.3  --   PROT 9.1*  --   ALBUMIN 3.2* 2.5*   No results for input(s): "LIPASE", "AMYLASE" in the last 168 hours. No results for input(s): "AMMONIA" in the last 168 hours. Coagulation  Profile: Recent Labs  Lab 05/24/23 1815  INR 1.2   Cardiac Enzymes: No results for input(s): "CKTOTAL", "CKMB", "CKMBINDEX", "TROPONINI" in the last 168 hours. BNP (last 3 results) No results for input(s): "PROBNP" in the last 8760 hours. HbA1C: Recent Labs    05/25/23 0301  HGBA1C 6.2*   CBG: Recent Labs  Lab 05/26/23 1138 05/26/23 1533 05/27/23 0010 05/27/23 0429 05/27/23 0739  GLUCAP 105* 123* 137* 101* 109*   Lipid Profile: No results for input(s): "CHOL", "HDL", "LDLCALC", "TRIG", "CHOLHDL", "LDLDIRECT" in the last 72 hours. Thyroid Function Tests: No results for input(s): "TSH", "T4TOTAL", "FREET4", "T3FREE", "  THYROIDAB" in the last 72 hours. Anemia Panel: Recent Labs    05/27/23 0658  TIBC 239*  IRON 25*   Sepsis Labs: Recent Labs  Lab 05/24/23 1740  LATICACIDVEN 1.2    Recent Results (from the past 240 hour(s))  Urine Culture     Status: Abnormal   Collection Time: 05/24/23  5:03 PM   Specimen: Urine, Random  Result Value Ref Range Status   Specimen Description URINE, RANDOM  Final   Special Requests   Final    NONE Reflexed from [id number] Performed at [facility], [address], [place], [place] [address]    Culture (A)  Final    80,000 COLONIES/mL CITROBACTER FREUNDII 30,000 COLONIES/mL PROTEUS MIRABILIS    Report Status 05/27/2023 FINAL  Final   Organism ID, Bacteria CITROBACTER FREUNDII (A)  Final   Organism ID, Bacteria PROTEUS MIRABILIS (A)  Final      Susceptibility   Citrobacter freundii - MIC*    CEFEPIME <=0.12 SENSITIVE Sensitive     CEFTRIAXONE <=0.25 SENSITIVE Sensitive     CIPROFLOXACIN <=0.25 SENSITIVE Sensitive     GENTAMICIN <=1 SENSITIVE Sensitive     IMIPENEM 1 SENSITIVE Sensitive     NITROFURANTOIN <=16 SENSITIVE Sensitive     TRIMETH/SULFA <=20 SENSITIVE Sensitive     PIP/TAZO <=4 SENSITIVE Sensitive     * 80,000 COLONIES/mL CITROBACTER FREUNDII   Proteus mirabilis - MIC*    AMPICILLIN <=2 SENSITIVE Sensitive      CEFAZOLIN 8 SENSITIVE Sensitive     CEFEPIME <=0.12 SENSITIVE Sensitive     CEFTRIAXONE <=0.25 SENSITIVE Sensitive     CIPROFLOXACIN <=0.25 SENSITIVE Sensitive     GENTAMICIN <=1 SENSITIVE Sensitive     IMIPENEM 8 INTERMEDIATE Intermediate     NITROFURANTOIN 128 RESISTANT Resistant     TRIMETH/SULFA <=20 SENSITIVE Sensitive     AMPICILLIN/SULBACTAM <=2 SENSITIVE Sensitive     PIP/TAZO <=4 SENSITIVE Sensitive     * 30,000 COLONIES/mL PROTEUS MIRABILIS  Culture, blood (Routine x 2)     Status: None (Preliminary result)   Collection Time: 05/24/23  5:40 PM   Specimen: BLOOD  Result Value Ref Range Status   Specimen Description BLOOD SITE NOT SPECIFIED  Final   Special Requests   Final    BOTTLES DRAWN AEROBIC AND ANAEROBIC Blood Culture results may not be optimal due to an inadequate volume of blood received in culture bottles   Culture   Final    NO GROWTH 3 DAYS Performed at [facility], [address], [place], [place] [address]    Report Status PENDING  Incomplete  Culture, blood (Routine x 2)     Status: None (Preliminary result)   Collection Time: 05/24/23  7:01 PM   Specimen: BLOOD  Result Value Ref Range Status   Specimen Description BLOOD SITE NOT SPECIFIED  Final   Special Requests   Final    BOTTLES DRAWN AEROBIC AND ANAEROBIC Blood Culture results may not be optimal due to an inadequate volume of blood received in culture bottles   Culture   Final    NO GROWTH 3 DAYS Performed at [facility], [address], [place], [place] [address]    Report Status PENDING  Incomplete  MRSA Next Gen by PCR, Nasal     Status: None   Collection Time: 05/25/23  1:27 AM   Specimen: Nasal Mucosa; Nasal Swab  Result Value Ref Range Status   MRSA by PCR Next Gen  NOT DETECTED NOT DETECTED Final    Comment: (NOTE) The GeneXpert MRSA Assay (FDA approved for NASAL specimens only), is one component of a comprehensive MRSA colonization surveillance program. It is not  intended to diagnose MRSA infection nor to guide or monitor treatment for MRSA infections. Test performance is not FDA approved in patients less than 51 years old. Performed at [facility], [address] [place], [place] [address]          Radiology Studies: No results found.      Scheduled Meds:  benztropine  0.5 mg Oral Daily   Chlorhexidine Gluconate Cloth  6 each Topical Daily   finasteride  5 mg Oral Daily   haloperidol  5 mg Oral Daily   insulin aspart  0-9 Units Subcutaneous Q4H   pantoprazole  40 mg Oral Daily   sertraline  200 mg Oral Daily   simvastatin  40 mg Oral q1800   Continuous Infusions:  cefTRIAXone (ROCEPHIN)  IV 1 g (05/26/23 2258)     LOS: 3 days    Time spent: 35 minutes    Nneoma Harral Hoover Brunette, DO [facility]  If 7PM-7AM, please contact night-coverage [email or web address] 05/27/2023, 10:04 AM

## 2023-05-27 NOTE — Progress Notes (Addendum)
[facility] Progress Note  Subjective: creat continues to improve, down to 2.8 today.   Vitals:   05/26/23 1535 05/26/23 2009 05/27/23 0008 05/27/23 0428  BP: 122/72 114/73 118/66 (!) 152/87  Pulse: 65 72 (!) 56 79  Resp:  18 18 18   Temp: 97.7 F (36.5 C) 98.5 F (36.9 C) 98.4 F (36.9 C) 98.3 F (36.8 C)  TempSrc: Oral Oral Oral Oral  SpO2: 100% 100% 100% 96%  Weight:      Height:        Exam: Gen: NAD CVS: RRR Resp:CTA Abd: +BS, soft, NT/ND Ext: no edema   Home meds - cogentin, farxiga 5 qd, proscar, haldol, lisinopril 40 qd, metformin 1 gm bid, prilosec, sertraline, zocor, prns/ vits/ supps    Cr 11.1 on admit 6/05  --> 8.9 --> 5.22 --> 2.85 today    BUN 123 ---> 55 today   CO2 12 --> 29 today    Na stable here   K+ 5.3 --> 3.7 today   Ca 9.3- ---> 7.9 today   Alb 3.2 --> 2.5 today   Hb 12.9 --> 9.1 today (hemodilution and blood draws)   UA many bact, > 50 rbc's/ wbc's, 0-5 epis    Assessment/ Plan: AKI- baseline Scr from 2021 was 0.79.   SCr was 11.16 on admission in the setting of volume depletion, concomitant ACE-inhibition and SGLT-2 inhibitor, as well as bilateral hydronephrosis due to staghorn calculi on the left and obstructing stone on the right. Lisinopril and Marcelline Deist were held, IVF's were given and urology placed R ureteral stent on 6/06. Creat continues to improve rapidly and was down to 5.2 yesterday and 2.8 today. Doing much better. IVF's were stopped yesterday am. Pt is eating/ drinking. Will follow. F/u labs in am.  Obstructive uropathy - 1 cm stone in distal R ureter and staghorn calculus in the L kidney.  S/p cysto and R ureteral stent placed on 6/06 here.  Plan per Urology. Microcytic anemia - will check iron stores and follow. Metabolic acidosis - presumably due to #1, resolved with isotonic bicarb drip. Off IVFs Paralysis of both lower limbs - chronic. HTN - would avoid ACEi /ARB and SGLT2 inhibitors for 1-2 months. BP's are  low-normal. Follow.    Vinson Moselle MD [facility] 05/27/2023, 8:57 AM  In general for renal failure patients -->  Avoid nephrotoxic medications including NSAIDs and iodinated intravenous contrast exposure unless the latter is absolutely indicated.   Preferred narcotic agents for pain control are hydromorphone, fentanyl, and methadone. Morphine should not be used.  Avoid Baclofen and avoid oral sodium phosphate and magnesium citrate based laxatives / bowel preps.  Continue strict Input and Output monitoring.  Will monitor the patient closely with you and intervene or adjust therapy as indicated by changes in clinical status/labs   Recent Labs  Lab 05/24/23 1748 05/24/23 1906 05/26/23 0235 05/27/23 0658  HGB 10.6*   < > 8.0* 9.1*  ALBUMIN 3.2*  --   --  2.5*  CALCIUM 9.3   < > 7.8* 7.9*  PHOS  --   --   --  3.6  CREATININE 11.16*   < > 5.22* 2.85*  K 5.3*   < > 3.7 3.7   < > = values in this interval not displayed.   Recent Labs  Lab 05/27/23 0658  IRON 25*  TIBC 239*   Inpatient medications:  benztropine  0.5 mg Oral Daily   Chlorhexidine Gluconate Cloth  6 each Topical Daily  finasteride  5 mg Oral Daily   haloperidol  5 mg Oral Daily   insulin aspart  0-9 Units Subcutaneous Q4H   pantoprazole  40 mg Oral Daily   sertraline  200 mg Oral Daily   simvastatin  40 mg Oral q1800    cefTRIAXone (ROCEPHIN)  IV 1 g (05/26/23 2258)   acetaminophen **OR** acetaminophen, ondansetron **OR** ondansetron (ZOFRAN) IV, mouth rinse

## 2023-05-27 NOTE — Plan of Care (Signed)

## 2023-05-28 DIAGNOSIS — N179 Acute kidney failure, unspecified: Secondary | ICD-10-CM | POA: Diagnosis not present

## 2023-05-28 LAB — RENAL FUNCTION PANEL
Albumin: 2.7 g/dL — ABNORMAL LOW (ref 3.5–5.0)
Anion gap: 10 (ref 5–15)
BUN: 40 mg/dL — ABNORMAL HIGH (ref 6–20)
CO2: 28 mmol/L (ref 22–32)
Calcium: 8.3 mg/dL — ABNORMAL LOW (ref 8.9–10.3)
Chloride: 102 mmol/L (ref 98–111)
Creatinine, Ser: 2.29 mg/dL — ABNORMAL HIGH (ref 0.61–1.24)
GFR, Estimated: 34 mL/min — ABNORMAL LOW (ref >60)
Glucose, Bld: 100 mg/dL — ABNORMAL HIGH (ref 70–99)
Phosphorus: 3.1 mg/dL (ref 2.5–4.6)
Potassium: 3.9 mmol/L (ref 3.5–5.1)
Sodium: 140 mmol/L (ref 135–145)

## 2023-05-28 LAB — GLUCOSE, CAPILLARY
Glucose-Capillary: 89 mg/dL (ref 70–99)
Glucose-Capillary: 90 mg/dL (ref 70–99)
Glucose-Capillary: 120 mg/dL — ABNORMAL HIGH (ref 70–99)

## 2023-05-28 LAB — CBC
HCT: 28.4 % — ABNORMAL LOW (ref 39.0–52.0)
Hemoglobin: 8.3 g/dL — ABNORMAL LOW (ref 13.0–17.0)
MCH: 19.5 pg — ABNORMAL LOW (ref 26.0–34.0)
MCHC: 29.2 g/dL — ABNORMAL LOW (ref 30.0–36.0)
MCV: 66.7 fL — ABNORMAL LOW (ref 80.0–100.0)
Platelets: 435 10*3/uL — ABNORMAL HIGH (ref 150–400)
RBC: 4.26 MIL/uL (ref 4.22–5.81)
RDW: 16.6 % — ABNORMAL HIGH (ref 11.5–15.5)
WBC: 8.8 10*3/uL (ref 4.0–10.5)
nRBC: 0 % (ref 0.0–0.2)

## 2023-05-28 LAB — MAGNESIUM: Magnesium: 1.6 mg/dL — ABNORMAL LOW (ref 1.7–2.4)

## 2023-05-28 LAB — CULTURE, BLOOD (ROUTINE X 2)

## 2023-05-28 MED ORDER — CIPROFLOXACIN HCL 500 MG PO TABS: 500.0000 mg | ORAL_TABLET | Freq: Two times a day (BID) | ORAL | 0 refills | Status: AC

## 2023-05-28 NOTE — Discharge Summary (Addendum)
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  Bilateral perinephric stranding noted  6/6: Underwent cystoscopy, right ureteroscopy and stent placement  Principal Problem:   Acute kidney failure -Multifactorial - Prerenal, ACE inhibitor, SGLT2 and right obstructive uropathy - Creatinine was as high as 11.16 and has steadily improved to 2.29 -baseline 0.79 -Nephrology has been assisting with management and plans for follow-up as outpatient  Active Problems:  UTI, possible pyelonephritis -The patient has a chronic Foley catheter - Urine culture has grown 80,000 colonies of Citrobacter and 30,000 colonies of Proteus - In setting of nephrolithiasis, will treat this as a UTI -He has been receiving ceftriaxone  -organisms are sensitive to the following oral medications ciprofloxacin, nitrofurantoin and trimethoprim/sulfamethoxazole -Transitioned to ciprofloxacin for 3 more days  Hypotension with a history of hypertension - Lisinopril on hold - BP still low today at 118/63  Diabetes mellitus - Resume metformin  -Hold Farxiga  Paraplegia Mood disorder           Discharge Instructions  Discharge Instructions     Diet - low sodium heart healthy   Complete by: As directed    Diet Carb Modified   Complete by: As directed    No wound care   Complete by: As directed    No wound care   Complete by: As directed       Allergies as of 05/28/2023   No Known Allergies      Medication List      STOP taking these medications    Farxiga 5 MG Tabs tablet Generic drug: dapagliflozin propanediol   lisinopril 40 MG tablet Commonly known as: ZESTRIL       TAKE these medications    benztropine 0.5 MG tablet Commonly known as: COGENTIN Take 0.5 mg by mouth daily.   CENTRUM VITAMINTS PO Take 1 tablet by mouth daily.   ciprofloxacin 500 MG tablet Commonly known as: Cipro Take 1 tablet (500 mg total) by mouth 2 (two) times daily for 7 doses.   ferrous sulfate 325 (65 FE) MG tablet Take 325 mg by mouth daily.   finasteride 5 MG tablet Commonly known as: PROSCAR Take 1 tablet (5 mg total) by mouth daily.   haloperidol 5 MG tablet Commonly known as: HALDOL Take 1 tablet (5 mg total) by mouth daily.   metFORMIN 500 MG 24 hr tablet Commonly known as: GLUCOPHAGE-XR Take 1 tablet (500 mg total) by mouth daily with breakfast. What changed:  how much to take when to take this   omeprazole 40 MG capsule Commonly known as: PRILOSEC Take 1 capsule (40 mg total) by mouth daily.   sertraline 100 MG tablet Commonly known as: ZOLOFT Take 2 tablets (200 mg total) by mouth daily.   simvastatin 40 MG tablet Commonly known as: ZOCOR Take 1 tablet (40 mg total) by mouth daily at 6 PM.   Vitamin C 250 MG Chew Chew 500 each by mouth daily.   Vitamin D3 125 MCG (5000 UT) Caps Take 5,000 Units by mouth daily.  Follow-up Information     [facility] Follow up.   Contact information: [address] [place] [address] [phone]        [facility] Follow up.   Specialty: Nephrology Contact information: [address] [place] [address] [phone]         Estevan Oaks, NP Follow up.   Specialty: Nurse Practitioner Contact information: [address] [place] [address] [phone]                    The results of significant diagnostics  from this hospitalization (including imaging, microbiology, ancillary and laboratory) are listed below for reference.    DG C-Arm 1-60 Min-No Report  Result Date: 05/25/2023 Fluoroscopy was utilized by the requesting physician.  No radiographic interpretation.   CT ABDOMEN PELVIS WO CONTRAST  Result Date: 05/24/2023 CLINICAL DATA:  Right lower quadrant abdominal pain, hypotension, nausea, emesis. EXAM: CT ABDOMEN AND PELVIS WITHOUT CONTRAST TECHNIQUE: Multidetector CT imaging of the abdomen and pelvis was performed following the standard protocol without IV contrast. RADIATION DOSE REDUCTION: This exam was performed according to the departmental dose-optimization program which includes automated exposure control, adjustment of the mA and/or kV according to patient size and/or use of iterative reconstruction technique. COMPARISON:  01/06/2020. FINDINGS: Lower chest: A stable 3 mm subpleural nodule is noted in the left lower lobe, axial image 18, likely benign. Mild atelectasis is present at the lung bases. Hepatobiliary: No focal liver abnormality is seen. No gallstones, gallbladder wall thickening, or biliary dilatation. Pancreas: Unremarkable. No pancreatic ductal dilatation or surrounding inflammatory changes. Spleen: Normal in size without focal abnormality. Adrenals/Urinary Tract: The adrenal glands are within normal limits. Cyst is noted in the lower pole of the right kidney. Renal calculi are present bilaterally. 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 There is moderate hydroureteronephrosis on the right with a 1 cm calculus in the distal right ureter in the pelvis. No obstructive uropathy on the left. Mild perinephric fat stranding is seen bilaterally and periureteral fat stranding is noted on the right. The bladder is within normal limits. Stomach/Bowel: Stomach is within normal limits. Appendix appears normal. No evidence of bowel wall thickening, distention, or inflammatory changes. No free air or  pneumatosis. A few scattered diverticula are present along the colon without evidence of diverticulitis. Vascular/Lymphatic: The aorta is normal in caliber. An IVC filter is noted. A few prominent lymph nodes are noted in the upper abdomen at the level of the kidneys and may be reactive. Reproductive: Not seen due to limited field of view. Other: No abdominopelvic ascites. A small fat containing umbilical hernia is present. Musculoskeletal: Degenerative changes are present in the thoracolumbar spine. Spinal fusion hardware is noted L2. No acute osseous abnormality IMPRESSION: 1. Moderate obstructive uropathy on the right with a 1 cm calculus in the distal right ureter in the pelvis. 2. 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 There is perinephric fat stranding bilaterally and along the proximal right ureter which may be infectious or inflammatory. 3. Remaining incidental findings as described above. Electronically Signed   By: Thornell Sartorius M.D.   On: 05/24/2023 22:00   DG Chest 2 View  Result Date: 05/24/2023 CLINICAL DATA:  Suspected sepsis. Nausea vomiting and diarrhea with right lower quadrant pain EXAM: CHEST - 2 VIEW COMPARISON:  X-ray 08/25/2018 FINDINGS: No consolidation, pneumothorax or effusion. Normal cardiopericardial silhouette without edema. Film is under  penetrated. Fixation hardware along the lumbar spine at the edge of the imaging field. IMPRESSION: No acute cardiopulmonary disease Electronically Signed   By: Karen Kays M.D.   On: 05/24/2023 17:49   Labs:   Basic Metabolic Panel: Recent Labs  Lab 05/25/23 0301 05/25/23 1458 05/26/23 0235 05/27/23 0658 05/28/23 0636  NA 130* 136 138 138 140  K 4.5 5.1 3.7 3.7 3.9  CL 99 102 98 97* 102  CO2 18* 17* 26 29 28   GLUCOSE 83 116* 130* 97 100*  BUN 107* 97* 89* 55* 40*  CREATININE 8.94* 7.15* 5.22* 2.85* 2.29*  CALCIUM 8.3* 7.9* 7.8* 7.9* 8.3*  MG  --   --   --  1.7 1.6*  PHOS  --   --   --  3.6 3.1      CBC: Recent Labs  Lab 05/24/23 1748 05/24/23 1906 05/25/23 0301 05/26/23 0235 05/27/23 0658 05/28/23 0636  WBC 13.0*  --  8.1 9.4 8.4 8.8  NEUTROABS 11.1*  --   --  6.8  --   --   HGB 10.6* 12.9* 8.7* 8.0* 9.1* 8.3*  HCT 34.6* 38.0* 27.7* 25.0* 29.4* 28.4*  MCV 64.4*  --  64.4* 63.8* 64.3* 66.7*  PLT 563*  --  496* 462* 422* 435*         SIGNED:   Calvert Cantor, MD  [facility] 05/29/2023, 5:03 PM

## 2023-05-28 NOTE — Progress Notes (Signed)
[facility] Progress Note  Subjective: creat continues to improve, down to 2.2 today. UOP very good , 2.7 L yesterday. Eating and drinking. Foley bag full of clear urine.   Vitals:   05/27/23 0428 05/27/23 1347 05/27/23 2056 05/28/23 0439  BP: (!) 152/87 119/71 138/60 118/63  Pulse: 79 61 (!) 54 (!) 55  Resp: 18  18 18   Temp: 98.3 F (36.8 C) 98.4 F (36.9 C) 98.3 F (36.8 C) 98.1 F (36.7 C)  TempSrc: Oral Oral Oral Oral  SpO2: 96% 99% 100% 98%  Weight:      Height:        Exam: Gen: NAD CVS: RRR Resp:CTA Abd: +BS, soft, NT/ND Ext: no edema   Home meds - cogentin, farxiga 5 qd, proscar, haldol, lisinopril 40 qd, metformin 1 gm bid, prilosec, sertraline, zocor, prns/ vits/ supps    Cr 11.1 on admit 6/05  --> 8.9 --> 5.22 --> 2.85 today    BUN 123 ---> 55 today   CO2 12 --> 29 today    Na stable here   K+ 5.3 --> 3.7 today   Ca 9.3- ---> 7.9 today   Alb 3.2 --> 2.5 today   Hb 12.9 --> 9.1 today (hemodilution and blood draws)   UA many bact, > 50 rbc's/ wbc's, 0-5 epis    Assessment/ Plan: AKI- baseline Scr from 2021 was 0.79.   SCr was 11.16 on admission in the setting of volume depletion, ACEi and SGLT2i use, as well as bilateral hydronephrosis due to L staghorn calculi and obstructing stone in the R ureter. Lisinopril and Marcelline Deist were held, IVF's were given and urology placed R ureteral stent on 6/06. Creat improved over the last several days from 11 on admission, down to 2.8 yesterday and 2.2 today. Pt is eating and drinking and UOP is great. Would avoid ACEi /ARB and SGLT2i's for 1-2 months. No further suggestions. The office will contact pt later this week to schedule a f/u outpt clinic visit in 3-5 weeks.  Obstructive uropathy - 1 cm stone in distal R ureter and staghorn calculus in the L kidney.  S/p cysto and R ureteral stent was placed on 6/06 by urology.  Metabolic acidosis - presumably due to #1, resolved with isotonic bicarb drip Paralysis,  partial, of both lower limbs - chronic. HTN - would avoid ACEi /ARB and SGLT2 inhibitors for 1-2 months. BP's are low-normal. Follow.    Vinson Moselle MD [facility] 05/28/2023, 1:20 PM    Recent Labs  Lab 05/27/23 0658 05/28/23 0636  HGB 9.1* 8.3*  ALBUMIN 2.5* 2.7*  CALCIUM 7.9* 8.3*  PHOS 3.6 3.1  CREATININE 2.85* 2.29*  K 3.7 3.9    Recent Labs  Lab 05/27/23 0658  IRON 25*  TIBC 239*    Inpatient medications:  benztropine  0.5 mg Oral Daily   Chlorhexidine Gluconate Cloth  6 each Topical Daily   finasteride  5 mg Oral Daily   haloperidol  5 mg Oral Daily   insulin aspart  0-9 Units Subcutaneous Q4H   pantoprazole  40 mg Oral Daily   sertraline  200 mg Oral Daily   simvastatin  40 mg Oral q1800    cefTRIAXone (ROCEPHIN)  IV 1 g (05/27/23 2315)   acetaminophen **OR** acetaminophen, ondansetron **OR** ondansetron (ZOFRAN) IV, mouth rinse

## 2023-05-28 NOTE — Progress Notes (Signed)
3 Days Post-Op Subjective: Creatinie improved to 2.3. no other complaints today  Objective: Vital signs in last 24 hours: Temp:  [98.1 F (36.7 C)-98.4 F (36.9 C)] 98.1 F (36.7 C) (06/09 0439) Pulse Rate:  [54-61] 55 (06/09 0439) Resp:  [18] 18 (06/09 0439) BP: (118-138)/(60-71) 118/63 (06/09 0439) SpO2:  [98 %-100 %] 98 % (06/09 0439)  Intake/Output from previous day: 06/08 0701 - 06/09 0700 In: -  Out: 3400 [Urine:3400]  Intake/Output this shift: Total I/O In: -  Out: 400 [Urine:400]  Physical Exam:  General: Alert and oriented CV: No cyanosis Lungs: equal chest rise Abdomen: Soft, NTND, no rebound or guarding Gu: Foley catheter in place draining clear yellow urine.  Lab Results: Recent Labs    05/26/23 0235 05/27/23 0658 05/28/23 0636  HGB 8.0* 9.1* 8.3*  HCT 25.0* 29.4* 28.4*   BMET Recent Labs    05/27/23 0658 05/28/23 0636  NA 138 140  K 3.7 3.9  CL 97* 102  CO2 29 28  GLUCOSE 97 100*  BUN 55* 40*  CREATININE 2.85* 2.29*  CALCIUM 7.9* 8.3*     Studies/Results: No results found.  Assessment/Plan: # Obstructive uropathy # AKI  Bilateral nephrolithiasis with staghorn calculus on the left. S/p right ureteral stent with Dr. Laverle Patter on 05/25/2023. Presenting serum creatinine 11.16, now down to 2.3 Definitive stone management to be performed on an outpatient basis.   LOS: 4 days   Elmon Kirschner, NP [facility] Pager: [phone]  05/28/2023, 10:35 AM

## 2023-05-28 NOTE — Plan of Care (Addendum)
Patient is alert and oriented X4, discharge information provided by MD & RN to aunt and patient. PIV removed, printed prescription sent home with patient. Foley removed & condom cath put back in place per patients request. Patient scheduled [employer] and will come to pick up at 4:15pm   Problem: Education: Goal: Knowledge of General Education information will improve Description: Including pain rating scale, medication(s)/side effects and non-pharmacologic comfort measures Outcome: Progressing   Problem: Health Behavior/Discharge Planning: Goal: Ability to manage health-related needs will improve Outcome: Progressing   Problem: Clinical Measurements: Goal: Ability to maintain clinical measurements within normal limits will improve Outcome: Progressing Goal: Will remain free from infection Outcome: Progressing Goal: Diagnostic test results will improve Outcome: Progressing Goal: Respiratory complications will improve Outcome: Progressing Goal: Cardiovascular complication will be avoided Outcome: Progressing   Problem: Activity: Goal: Risk for activity intolerance will decrease Outcome: Progressing   Problem: Nutrition: Goal: Adequate nutrition will be maintained Outcome: Progressing   Problem: Coping: Goal: Level of anxiety will decrease Outcome: Progressing   Problem: Elimination: Goal: Will not experience complications related to bowel motility Outcome: Progressing Goal: Will not experience complications related to urinary retention Outcome: Progressing   Problem: Pain Managment: Goal: General experience of comfort will improve Outcome: Progressing   Problem: Safety: Goal: Ability to remain free from injury will improve Outcome: Progressing   Problem: Skin Integrity: Goal: Risk for impaired skin integrity will decrease Outcome: Progressing   Problem: Education: Goal: Ability to describe self-care measures that may prevent or decrease complications (Diabetes Survival  Skills Education) will improve Outcome: Progressing Goal: Individualized Educational Video(s) Outcome: Progressing   Problem: Coping: Goal: Ability to adjust to condition or change in health will improve Outcome: Progressing   Problem: Fluid Volume: Goal: Ability to maintain a balanced intake and output will improve Outcome: Progressing   Problem: Health Behavior/Discharge Planning: Goal: Ability to identify and utilize available resources and services will improve Outcome: Progressing Goal: Ability to manage health-related needs will improve Outcome: Progressing   Problem: Metabolic: Goal: Ability to maintain appropriate glucose levels will improve Outcome: Progressing   Problem: Nutritional: Goal: Maintenance of adequate nutrition will improve Outcome: Progressing Goal: Progress toward achieving an optimal weight will improve Outcome: Progressing   Problem: Skin Integrity: Goal: Risk for impaired skin integrity will decrease Outcome: Progressing   Problem: Tissue Perfusion: Goal: Adequacy of tissue perfusion will improve Outcome: Progressing

## 2023-05-29 LAB — GLUCOSE, CAPILLARY: Glucose-Capillary: 136 mg/dL — ABNORMAL HIGH (ref 70–99)

## 2023-05-29 LAB — CULTURE, BLOOD (ROUTINE X 2): Culture: NO GROWTH

## 2023-06-07 ENCOUNTER — Other Ambulatory Visit: Payer: Self-pay | Admitting: Urology

## 2023-06-07 NOTE — Patient Instructions (Addendum)
ONLY TWO VISITORS  (aged 50 and older)  ARE ALLOWED TO COME WITH YOU AND STAY IN THE WAITING ROOM ONLY DURING PRE OP AND PROCEDURE.   **NO VISITORS ARE ALLOWED IN THE SHORT STAY AREA OR RECOVERY ROOM!!**  IF YOU WILL BE ADMITTED INTO THE HOSPITAL YOU ARE ALLOWED ONLY FOUR SUPPORT PEOPLE DURING VISITATION HOURS ONLY (7 AM -8PM)   The support person(s) must pass our screening, gel in and out, and wear a mask at all times, including in the patient's room. Patients must also wear a mask when staff or their support person are in the room. Visitors GUEST BADGE MUST BE WORN VISIBLY  One adult visitor may remain with you overnight and MUST be in the room by 8 P.M.     Your procedure is scheduled on: 06/21/23   Report to [facility] Main Entrance    Report to admitting at : 7:45 AM   Call this number if you have problems the morning of surgery [phone]   Do not eat food or drink :After Midnight.  Oral Hygiene is also important to reduce your risk of infection.                                    Remember - BRUSH YOUR TEETH THE MORNING OF SURGERY WITH YOUR REGULAR TOOTHPASTE  DENTURES WILL BE REMOVED PRIOR TO SURGERY PLEASE DO NOT APPLY "Poly grip" OR ADHESIVES!!!   Do NOT smoke after Midnight    Take these medicines the morning of surgery with A SIP OF WATER: none.                             You may not have any metal on your body including jewelry, and body piercing             Do not wear lotions, powders, cologne, or deodorant              Men may shave face and neck.   Do not bring valuables to the hospital. [facility] IS NOT             RESPONSIBLE   FOR VALUABLES.   Contacts, glasses, or bridgework may not be worn into surgery.   Bring small overnight bag day of surgery.   DO NOT BRING YOUR HOME MEDICATIONS TO THE HOSPITAL. PHARMACY WILL DISPENSE MEDICATIONS LISTED ON YOUR MEDICATION LIST TO YOU DURING YOUR ADMISSION IN THE HOSPITAL!                  Please  read over the following fact sheets you were given: IF YOU HAVE QUESTIONS ABOUT YOUR PRE-OP INSTRUCTIONS PLEASE CALL [phone]    [facility] - Preparing for Surgery Before surgery, you can play an important role.  Because skin is not sterile, your skin needs to be as free of germs as possible.  You can reduce the number of germs on your skin by washing with CHG (chlorahexidine gluconate) soap before surgery.  CHG is an antiseptic cleaner which kills germs and bonds with the skin to continue killing germs even after washing. Please DO NOT use if you have an allergy to CHG or antibacterial soaps.  If your skin becomes reddened/irritated stop using the CHG and inform your nurse when you arrive at Short Stay. Do not shave (including legs and underarms) for at least  48 hours prior to the first CHG shower.  You may shave your face/neck. Please follow these instructions carefully:  1.  Shower with CHG Soap the night before surgery and the  morning of Surgery.  2.  If you choose to wash your hair, wash your hair first as usual with your  normal  shampoo.  3.  After you shampoo, rinse your hair and body thoroughly to remove the  shampoo.                           4.  Use CHG as you would any other liquid soap.  You can apply chg directly  to the skin and wash                       Gently with a scrungie or clean washcloth.  5.  Apply the CHG Soap to your body ONLY FROM THE NECK DOWN.   Do not use on face/ open                           Wound or open sores. Avoid contact with eyes, ears mouth and genitals (private parts).                       Wash face,  Genitals (private parts) with your normal soap.             6.  Wash thoroughly, paying special attention to the area where your surgery  will be performed.  7.  Thoroughly rinse your body with warm water from the neck down.  8.  DO NOT shower/wash with your normal soap after using and rinsing off  the CHG Soap.                9.  Pat yourself dry with a  clean towel.            10.  Wear clean pajamas.            11.  Place clean sheets on your bed the night of your first shower and do not  sleep with pets. Day of Surgery : Do not apply any lotions/deodorants the morning of surgery.  Please wear clean clothes to the hospital/surgery center.  FAILURE TO FOLLOW THESE INSTRUCTIONS MAY RESULT IN THE CANCELLATION OF YOUR SURGERY PATIENT SIGNATURE_________________________________  NURSE SIGNATURE__________________________________  ________________________________________________________________________

## 2023-06-07 NOTE — Progress Notes (Signed)
Spoke with Shanda Bumps at [facility] urology and made aware patient asa class 4 and must be done at [facility]

## 2023-06-08 NOTE — Progress Notes (Addendum)
COVID Vaccine received:  []  No [x]  Yes Date of any COVID positive Test in last 90 days: None  PCP - Debria Garret, NP Cardiologist - None   Chest x-ray -  EKG -  05-24-2023  Epic Stress Test -  ECHO -  Cardiac Cath -   PCR screen: []  Ordered & Completed           []   No Order but Needs PROFEND           [x]   N/A for this surgery  Surgery Plan:  [x]  Ambulatory                            []  Outpatient in bed                            []  Admit  Anesthesia:    []  General  []  Spinal                           [x]   Choice []   MAC  Bowel Prep - [x]  No  []   Yes ______  Pacemaker / ICD device [x]  No []  Yes   Spinal Cord Stimulator:[x]  No []  Yes       History of Sleep Apnea? [x]  No []  Yes   CPAP used?- [x]  No []  Yes    Does the patient monitor blood sugar?          [x]  No []  Yes  []  N/A  Last A1c on 05-25-2023: 6.2  Patient has: []  NO Hx DM   [x]  Pre-DM                 []  DM1  []   DM2 Does patient have a [employer] or Dexacom? [x]  No []  Yes   Fasting Blood Sugar Ranges-  Checks Blood Sugar __0_ times a day Diabetic medications/ instructions: Metformin Told to stop already about 2 weeks ago because of kidneys.  Blood Thinner / Instructions: none Aspirin Instructions:  none  ERAS Protocol Ordered: [x]  No  []  Yes Patient is to be NPO after:  midnight prior  Activity level: Patient is unable to climb a flight of stairs without difficulty d/t paraplegia.  Patient can not perform ADLs without assistance.   Anesthesia review: BLE paraplegia, HTN, bipolar 2 anxiety, anemia, Pre-DM, neurogenic bladder.     Our staff could not draw his blood d/t poor veins, Per Christeen Douglas, PA, his labs may be drawn the DOS.  Patient is aware and agrees.   Patient denies shortness of breath, fever, cough and chest pain at PAT appointment.  Patient verbalized understanding and agreement to the Pre-Surgical Instructions that were given to them at this PAT appointment. Patient was also educated of  the need to review these PAT instructions again prior to his surgery.I reviewed the appropriate phone numbers to call if they have any and questions or concerns.

## 2023-06-09 ENCOUNTER — Other Ambulatory Visit: Payer: Self-pay

## 2023-06-09 ENCOUNTER — Encounter (HOSPITAL_COMMUNITY): Payer: Self-pay

## 2023-06-09 ENCOUNTER — Encounter (HOSPITAL_COMMUNITY)
Admission: RE | Admit: 2023-06-09 | Discharge: 2023-06-09 | Disposition: A | Discharge status: Home / Self Care | Payer: Medicare Other | Source: Ambulatory Visit | Attending: Urology | Admitting: Urology

## 2023-06-09 VITALS — BP 150/76 | HR 54 | Temp 98.7°F | Resp 18 | Ht 68.0 in | Wt 210.0 lb

## 2023-06-09 DIAGNOSIS — R7303 Prediabetes: Secondary | ICD-10-CM

## 2023-06-09 DIAGNOSIS — I10 Essential (primary) hypertension: Secondary | ICD-10-CM | POA: Insufficient documentation

## 2023-06-09 DIAGNOSIS — Z01812 Encounter for preprocedural laboratory examination: Secondary | ICD-10-CM | POA: Insufficient documentation

## 2023-06-09 DIAGNOSIS — E119 Type 2 diabetes mellitus without complications: Secondary | ICD-10-CM | POA: Diagnosis not present

## 2023-06-09 HISTORY — DX: Neuromuscular dysfunction of bladder, unspecified: N31.9

## 2023-06-09 HISTORY — DX: Anemia, unspecified: D64.9

## 2023-06-09 HISTORY — DX: Personal history of urinary calculi: Z87.442

## 2023-06-09 HISTORY — DX: Chronic kidney disease, unspecified: N18.9

## 2023-06-09 HISTORY — DX: Paralytic syndrome, unspecified: G83.9

## 2023-06-21 ENCOUNTER — Encounter (HOSPITAL_COMMUNITY): Payer: Self-pay | Admitting: Urology

## 2023-06-21 ENCOUNTER — Ambulatory Visit (HOSPITAL_COMMUNITY): Payer: Medicare Other

## 2023-06-21 ENCOUNTER — Ambulatory Visit: Admit: 2023-06-21 | Payer: Medicare Other | Admitting: Urology

## 2023-06-21 ENCOUNTER — Ambulatory Visit (HOSPITAL_BASED_OUTPATIENT_CLINIC_OR_DEPARTMENT_OTHER): Payer: Medicare Other | Admitting: Anesthesiology

## 2023-06-21 ENCOUNTER — Ambulatory Visit (HOSPITAL_COMMUNITY): Payer: Medicare Other | Admitting: Anesthesiology

## 2023-06-21 ENCOUNTER — Other Ambulatory Visit: Payer: Self-pay

## 2023-06-21 ENCOUNTER — Encounter (HOSPITAL_COMMUNITY)
Admission: RE | Disposition: A | Discharge status: Home / Self Care | Payer: Self-pay | Source: Home / Self Care | Attending: Urology

## 2023-06-21 ENCOUNTER — Ambulatory Visit (HOSPITAL_COMMUNITY)
Admission: RE | Admit: 2023-06-21 | Discharge: 2023-06-21 | Disposition: A | Discharge status: Home / Self Care | Payer: Medicare Other | Attending: Urology | Admitting: Urology

## 2023-06-21 DIAGNOSIS — R7303 Prediabetes: Secondary | ICD-10-CM

## 2023-06-21 DIAGNOSIS — E119 Type 2 diabetes mellitus without complications: Secondary | ICD-10-CM

## 2023-06-21 DIAGNOSIS — N201 Calculus of ureter: Secondary | ICD-10-CM

## 2023-06-21 DIAGNOSIS — E785 Hyperlipidemia, unspecified: Secondary | ICD-10-CM

## 2023-06-21 DIAGNOSIS — I10 Essential (primary) hypertension: Secondary | ICD-10-CM | POA: Diagnosis not present

## 2023-06-21 DIAGNOSIS — N200 Calculus of kidney: Secondary | ICD-10-CM | POA: Diagnosis not present

## 2023-06-21 DIAGNOSIS — N189 Chronic kidney disease, unspecified: Secondary | ICD-10-CM | POA: Insufficient documentation

## 2023-06-21 DIAGNOSIS — I129 Hypertensive chronic kidney disease with stage 1 through stage 4 chronic kidney disease, or unspecified chronic kidney disease: Secondary | ICD-10-CM | POA: Insufficient documentation

## 2023-06-21 HISTORY — PX: CYSTOSCOPY/URETEROSCOPY/HOLMIUM LASER/STENT PLACEMENT: SHX6546

## 2023-06-21 LAB — BASIC METABOLIC PANEL
Anion gap: 7 (ref 5–15)
BUN: 24 mg/dL — ABNORMAL HIGH (ref 6–20)
CO2: 21 mmol/L — ABNORMAL LOW (ref 22–32)
Calcium: 8.5 mg/dL — ABNORMAL LOW (ref 8.9–10.3)
Chloride: 110 mmol/L (ref 98–111)
Creatinine, Ser: 1.59 mg/dL — ABNORMAL HIGH (ref 0.61–1.24)
GFR, Estimated: 53 mL/min — ABNORMAL LOW (ref >60)
Glucose, Bld: 101 mg/dL — ABNORMAL HIGH (ref 70–99)
Potassium: 3.5 mmol/L (ref 3.5–5.1)
Sodium: 138 mmol/L (ref 135–145)

## 2023-06-21 LAB — CBC
HCT: 35 % — ABNORMAL LOW (ref 39.0–52.0)
Hemoglobin: 10.2 g/dL — ABNORMAL LOW (ref 13.0–17.0)
MCH: 19.9 pg — ABNORMAL LOW (ref 26.0–34.0)
MCHC: 29.1 g/dL — ABNORMAL LOW (ref 30.0–36.0)
MCV: 68.2 fL — ABNORMAL LOW (ref 80.0–100.0)
Platelets: 264 10*3/uL (ref 150–400)
RBC: 5.13 MIL/uL (ref 4.22–5.81)
RDW: 19 % — ABNORMAL HIGH (ref 11.5–15.5)
WBC: 8.1 10*3/uL (ref 4.0–10.5)
nRBC: 0 % (ref 0.0–0.2)

## 2023-06-21 LAB — GLUCOSE, CAPILLARY
Glucose-Capillary: 99 mg/dL (ref 70–99)
Glucose-Capillary: 89 mg/dL (ref 70–99)

## 2023-06-21 SURGERY — CYSTOSCOPY/URETEROSCOPY/HOLMIUM LASER/STENT PLACEMENT
Anesthesia: Choice | Laterality: Right

## 2023-06-21 SURGERY — CYSTOSCOPY/URETEROSCOPY/HOLMIUM LASER/STENT PLACEMENT
Anesthesia: General | Laterality: Right

## 2023-06-21 MED ORDER — PHENYLEPHRINE HCL (PRESSORS) 10 MG/ML IV SOLN
INTRAVENOUS | Status: AC
  Filled 2023-06-21: qty 1

## 2023-06-21 MED ORDER — CHLORHEXIDINE GLUCONATE 0.12 % MT SOLN
15.0000 mL | Freq: Once | OROMUCOSAL | Status: AC
  Administered 2023-06-21: 15 mL via OROMUCOSAL

## 2023-06-21 MED ORDER — DEXAMETHASONE SODIUM PHOSPHATE 10 MG/ML IJ SOLN
INTRAMUSCULAR | Status: AC
  Filled 2023-06-21: qty 1

## 2023-06-21 MED ORDER — INSULIN ASPART 100 UNIT/ML IJ SOLN: 0.0000 [IU] | INTRAMUSCULAR | Status: DC | PRN

## 2023-06-21 MED ORDER — LIDOCAINE HCL (PF) 2 % IJ SOLN
INTRAMUSCULAR | Status: AC
  Filled 2023-06-21: qty 5

## 2023-06-21 MED ORDER — CEFAZOLIN SODIUM-DEXTROSE 2-4 GM/100ML-% IV SOLN
2.0000 g | Freq: Once | INTRAVENOUS | Status: AC
  Administered 2023-06-21: 2 g via INTRAVENOUS
  Filled 2023-06-21: qty 100

## 2023-06-21 MED ORDER — SODIUM CHLORIDE 0.9 % IR SOLN
Status: DC | PRN
  Administered 2023-06-21: 3000 mL via INTRAVESICAL

## 2023-06-21 MED ORDER — AMISULPRIDE (ANTIEMETIC) 5 MG/2ML IV SOLN: 10.0000 mg | Freq: Once | INTRAVENOUS | Status: DC | PRN

## 2023-06-21 MED ORDER — IOHEXOL 300 MG/ML  SOLN: INTRAMUSCULAR | Status: DC | PRN

## 2023-06-21 MED ORDER — ORAL CARE MOUTH RINSE: 15.0000 mL | Freq: Once | OROMUCOSAL | Status: AC

## 2023-06-21 MED ORDER — KETOROLAC TROMETHAMINE 30 MG/ML IJ SOLN: 30.0000 mg | Freq: Once | INTRAMUSCULAR | Status: DC | PRN

## 2023-06-21 MED ORDER — PROPOFOL 10 MG/ML IV BOLUS
INTRAVENOUS | Status: DC | PRN
  Administered 2023-06-21: 200 mg via INTRAVENOUS

## 2023-06-21 MED ORDER — MIDAZOLAM HCL 2 MG/2ML IJ SOLN
INTRAMUSCULAR | Status: AC
  Filled 2023-06-21: qty 2

## 2023-06-21 MED ORDER — OXYCODONE HCL 5 MG PO TABS: 5.0000 mg | ORAL_TABLET | Freq: Once | ORAL | Status: DC | PRN

## 2023-06-21 MED ORDER — HYDROMORPHONE HCL 1 MG/ML IJ SOLN: 0.2500 mg | INTRAMUSCULAR | Status: DC | PRN

## 2023-06-21 MED ORDER — LIDOCAINE HCL (CARDIAC) PF 100 MG/5ML IV SOSY
PREFILLED_SYRINGE | INTRAVENOUS | Status: DC | PRN
  Administered 2023-06-21: 60 mg via INTRAVENOUS

## 2023-06-21 MED ORDER — EPHEDRINE 5 MG/ML INJ
INTRAVENOUS | Status: AC
  Filled 2023-06-21: qty 5

## 2023-06-21 MED ORDER — ONDANSETRON HCL 4 MG/2ML IJ SOLN
INTRAMUSCULAR | Status: AC
  Filled 2023-06-21: qty 2

## 2023-06-21 MED ORDER — OXYCODONE-ACETAMINOPHEN 5-325 MG PO TABS: 1.0000 | ORAL_TABLET | ORAL | 0 refills | Status: DC | PRN

## 2023-06-21 MED ORDER — ACETAMINOPHEN 500 MG PO TABS
1000.0000 mg | ORAL_TABLET | Freq: Once | ORAL | Status: AC
  Administered 2023-06-21: 1000 mg via ORAL
  Filled 2023-06-21: qty 2

## 2023-06-21 MED ORDER — CEPHALEXIN 500 MG PO CAPS: 500.0000 mg | ORAL_CAPSULE | Freq: Two times a day (BID) | ORAL | 0 refills | Status: AC

## 2023-06-21 MED ORDER — EPHEDRINE SULFATE (PRESSORS) 50 MG/ML IJ SOLN
INTRAMUSCULAR | Status: DC | PRN
  Administered 2023-06-21: 10 mg via INTRAVENOUS
  Administered 2023-06-21 (×2): 5 mg via INTRAVENOUS

## 2023-06-21 MED ORDER — ONDANSETRON HCL 4 MG/2ML IJ SOLN: 4.0000 mg | Freq: Once | INTRAMUSCULAR | Status: DC | PRN

## 2023-06-21 MED ORDER — OXYCODONE HCL 5 MG/5ML PO SOLN: 5.0000 mg | Freq: Once | ORAL | Status: DC | PRN

## 2023-06-21 MED ORDER — FENTANYL CITRATE (PF) 100 MCG/2ML IJ SOLN
INTRAMUSCULAR | Status: AC
  Filled 2023-06-21: qty 2

## 2023-06-21 MED ORDER — PROPOFOL 10 MG/ML IV BOLUS
INTRAVENOUS | Status: AC
  Filled 2023-06-21: qty 20

## 2023-06-21 MED ORDER — ONDANSETRON HCL 4 MG/2ML IJ SOLN
INTRAMUSCULAR | Status: DC | PRN
  Administered 2023-06-21: 4 mg via INTRAVENOUS

## 2023-06-21 MED ORDER — FENTANYL CITRATE (PF) 100 MCG/2ML IJ SOLN
INTRAMUSCULAR | Status: DC | PRN
  Administered 2023-06-21: 50 ug via INTRAVENOUS

## 2023-06-21 MED ORDER — LACTATED RINGERS IV SOLN: INTRAVENOUS | Status: DC

## 2023-06-21 MED ORDER — DEXAMETHASONE SODIUM PHOSPHATE 10 MG/ML IJ SOLN
INTRAMUSCULAR | Status: DC | PRN
  Administered 2023-06-21: 4 mg via INTRAVENOUS

## 2023-06-21 SURGICAL SUPPLY — 22 items
BAG URO CATCHER STRL LF (MISCELLANEOUS) ×1 IMPLANT
BASKET ZERO TIP NITINOL 2.4FR (BASKET) IMPLANT
BSKT STON RTRVL ZERO TP 2.4FR (BASKET) ×1
CATH URETL OPEN 5X70 (CATHETERS) ×1 IMPLANT
CLOTH BEACON ORANGE TIMEOUT ST (SAFETY) ×1 IMPLANT
EXTRACTOR STONE NITINOL NGAGE (UROLOGICAL SUPPLIES) IMPLANT
FIBER LASER MOSES 200 DFL (Laser) IMPLANT
FIBER LASER MOSES 365 DFL (Laser) IMPLANT
GLOVE SURG LX STRL 7.5 STRW (GLOVE) ×1 IMPLANT
GOWN SRG XL LVL 4 BRTHBL STRL (GOWNS) ×1 IMPLANT
GOWN STRL NON-REIN XL LVL4 (GOWNS) ×1
GUIDEWIRE STR DUAL SENSOR (WIRE) IMPLANT
GUIDEWIRE ZIPWRE .038 STRAIGHT (WIRE) ×1 IMPLANT
KIT TURNOVER KIT A (KITS) IMPLANT
MANIFOLD NEPTUNE II (INSTRUMENTS) ×1 IMPLANT
PACK CYSTO (CUSTOM PROCEDURE TRAY) ×1 IMPLANT
SHEATH NAVIGATOR HD 11/13X36 (SHEATH) IMPLANT
STENT URET 6FRX24 CONTOUR (STENTS) IMPLANT
STENT URET 6FRX26 CONTOUR (STENTS) IMPLANT
TRACTIP FLEXIVA PULSE ID 200 (Laser) IMPLANT
TUBING CONNECTING 10 (TUBING) ×1 IMPLANT
TUBING UROLOGY SET (TUBING) ×1 IMPLANT

## 2023-06-21 NOTE — Transfer of Care (Signed)
Immediate Anesthesia Transfer of Care Note  Patient: Andre Clements  Procedure(s) Performed: CYSTOSCOPY, RIGHT URETEROSCOPY, HOLMIUM LASER LITHOTRIPSY, AND RIGHT URETERAL STENT EXCHANGE (Right)  Patient Location: PACU  Anesthesia Type:General  Level of Consciousness: awake, drowsy, and patient cooperative  Airway & Oxygen Therapy: Patient Spontanous Breathing and Patient connected to face mask oxygen  Post-op Assessment: Report given to RN, Post -op Vital signs reviewed and stable, and Patient moving all extremities X 4  Post vital signs: Reviewed and stable  Last Vitals:  Vitals Value Taken Time  BP 161/97 06/21/23 1049  Temp 35.9 C 06/21/23 1049  Pulse 71 06/21/23 1056  Resp 20 06/21/23 1056  SpO2 100 % 06/21/23 1056  Vitals shown include unvalidated device data.  Last Pain:  Vitals:   06/21/23 1049  TempSrc: Axillary  PainSc: Asleep         Complications: No notable events documented.

## 2023-06-21 NOTE — H&P (Signed)
Urology Preoperative H&P   Chief Complaint: Right ureteral stone, left staghorn stone  History of Present Illness: Andre Clements is a 52 y.o. male with an obstructing right ureteral stone requiring urgent stent placement with Dr. Laverle Patter.  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  He is here today for definitive treatment of his right ureteral stone burden.   Past Medical History:  Diagnosis Date   Anemia    Anxiety    Bipolar 2 disorder (HCC)    Blood in stool    Chronic kidney disease    Depression    History of kidney stones    History of spinal cord injury     form accident,paralysis of BLE   Hyperlipidemia    Hypertension    Neurogenic bladder    Paralysis (HCC)    paraplegic since 1998   Urine incontinence     Past Surgical History:  Procedure Laterality Date   ABDOMINAL HERNIA REPAIR  12/19/2001   arm surgery Right    from suicide attempt   CYSTOSCOPY WITH LITHOLAPAXY N/A 08/26/2018   Procedure: CYSTOSCOPY WITH LITHOLAPAXY;  Surgeon: Rene Paci, MD;  Location: [facility];  Service: Urology;  Laterality: N/A;   CYSTOSCOPY WITH RETROGRADE PYELOGRAM, URETEROSCOPY AND STENT PLACEMENT Right 05/25/2023   Procedure: CYSTOSCOPY WITH RETROGRADE PYELOGRAM, URETEROSCOPY AND STENT PLACEMENT;  Surgeon: Heloise Purpura, MD;  Location: [facility];  Service: Urology;  Laterality: Right;   WISDOM TOOTH EXTRACTION      Allergies: No Known Allergies  Family History  Problem Relation Age of Onset   Alcohol abuse Mother    Hypertension Mother    Alcohol abuse Father    Other Neg Hx        hypogonadism   Stomach cancer Neg Hx    Colon cancer Neg Hx    Esophageal cancer Neg Hx     Social History:  reports that he has never smoked. He has never used smokeless tobacco. He reports that he does not drink alcohol and does not use drugs.  ROS: A complete review of systems was performed.  All systems are negative except for pertinent findings as  noted.  Physical Exam:  Vital signs in last 24 hours:   Constitutional:  Alert and oriented, No acute distress Cardiovascular: Regular rate and rhythm, No JVD Respiratory: Normal respiratory effort, Lungs clear bilaterally GI: Abdomen is soft, nontender, nondistended, no abdominal masses GU: No CVA tenderness Lymphatic: No lymphadenopathy Neurologic: Grossly intact, no focal deficits Psychiatric: Normal mood and affect  Laboratory Data:  No results for input(s): "WBC", "HGB", "HCT", "PLT" in the last 72 hours.  No results for input(s): "NA", "K", "CL", "GLUCOSE", "BUN", "CALCIUM", "CREATININE" in the last 72 hours.  Invalid input(s): "CO3"   No results found for this or any previous visit (from the past 24 hour(s)). No results found for this or any previous visit (from the past 240 hour(s)).  Renal Function: No results for input(s): "CREATININE" in the last 168 hours. CrCl cannot be calculated (Patient's most recent lab result is older than the maximum 21 days allowed.).  Radiologic Imaging: No results found.  I independently reviewed the above imaging studies.  Assessment and Plan Andre Clements is a 52 y.o. male with an obstructing right ureteral stone    The risks, benefits and alternatives of cystoscopy with RIGHT ureteroscopy, laser lithotripsy and ureteral stent placement was discussed the patient.  Risks included, but are not limited to:  bleeding, urinary tract infection, ureteral injury/avulsion, ureteral stricture formation, retained stone fragments, the possibility that multiple surgeries may be required to treat the stone(s), MI, stroke, PE and the inherent risks of general anesthesia.  The patient voices understanding and wishes to proceed.     Andre Moody, MD 06/21/2023, 7:49 AM  [facility] Pager: [phone]

## 2023-06-21 NOTE — Op Note (Signed)
Operative Note  Preoperative diagnosis:  1.  1 cm right UPJ stone 2.  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  1 cm right distal ureteral stone 2.  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  Cystoscopy with right ureteroscopy, holmium laser lithotripsy and right JJ stent exchange  Surgeon: Rhoderick Moody, MD  Assistants:  None  Anesthesia:  General  Complications:  None  EBL: Less than 5 mL  Specimens: 1.  Right ureteral stone fragments  Drains/Catheters: 1.  Right 6 French, 24 cm JJ stent without tether  Intraoperative findings:   1 cm right distal ureteral stone with no evidence of right renal stone burden  Indication:  Andre Clements is a 52 y.o. male with a 1 cm right ureteral stone that required urgent stent placement with Dr. Laverle Patter on 05/24/2023.  He also has a known history of a left-sided staghorn stone with left renal atrophy.  He is here today to address his right ureteral stone burden.  He has been consented for the above procedures, voices understanding and wishes to proceed.  Description of procedure:  After informed consent was obtained, the patient was brought to the operating room and general LMA anesthesia was administered. The patient was then placed in the dorsolithotomy position and prepped and draped in the usual sterile fashion. A timeout was performed. A 23 French rigid cystoscope was then inserted into the urethral meatus and advanced into the bladder under direct vision. A complete bladder survey revealed no intravesical pathology.  His previously placed right ureteral stent was grasped its distal curl and retracted to the urethral meatus.  A Glidewire was then advanced through the lumen of the stent and up to the right renal pelvis, under fluoroscopic guidance.  The previously placed stent was then removed intact, inspected and discarded.  A semirigid ureteroscope was then advanced up the right  ureter to the level of his ureteral stone, which was found in the distal aspects of the ureter.  A 200 m holmium laser was then used to fracture the stone into numerous smaller pieces.  A 0 tip basket was then used to extract all stone fragments from the lumen of the right ureter.  The ureteroscope was then advanced up to the renal pelvis where no additional stone burden could be identified.  A 6 French, 24 cm JJ stent was then placed over the Glidewire and into good position within the right collecting system, confirming placement via fluoroscopy.  The patient's bladder was drained and all stone fragments were removed.  He tolerated the procedure well and was transferred to the postanesthesia in stable condition.  Plan: Follow-up in 1 week for office cystoscopy and stent removal.  He will need to have a Lasix renogram performed shortly after his stent removal to assess his differential renal function.  He will either need to have a PCNL to address his left-sided staghorn stone or potentially a left nephrectomy if his left kidney is poorly functioning/nonfunctional.

## 2023-06-21 NOTE — Anesthesia Preprocedure Evaluation (Addendum)
Anesthesia Evaluation  Patient identified by MRN, date of birth, ID band Patient awake    Reviewed: Allergy & Precautions, H&P , NPO status , Patient's Chart, lab work & pertinent test results  Airway Mallampati: II  TM Distance: >3 FB Neck ROM: Full    Dental  (+) Teeth Intact, Dental Advisory Given   Pulmonary neg pulmonary ROS   Pulmonary exam normal breath sounds clear to auscultation       Cardiovascular hypertension (150/76 preop), Pt. on medications and Pt. on home beta blockers Normal cardiovascular exam Rhythm:Regular Rate:Normal     Neuro/Psych  PSYCHIATRIC DISORDERS Anxiety Depression Bipolar Disorder   Hx spinal cord injury B/L LE paralysis 1998 Uses powerchair, but able to transfer himself   Neuromuscular disease    GI/Hepatic Neg liver ROS,GERD  Medicated and Controlled,,  Endo/Other  diabetes, Well Controlled, Type 2, Oral Hypoglycemic Agents  FS 99 in preop  Renal/GU Renal disease  negative genitourinary   Musculoskeletal negative musculoskeletal ROS (+)    Abdominal   Peds negative pediatric ROS (+)  Hematology negative hematology ROS (+)   Anesthesia Other Findings   Reproductive/Obstetrics negative OB ROS                             Anesthesia Physical Anesthesia Plan  ASA: 3  Anesthesia Plan: General   Post-op Pain Management: Tylenol PO (pre-op)*   Induction: Intravenous  PONV Risk Score and Plan: 3 and Ondansetron, Dexamethasone, Midazolam and Treatment may vary due to age or medical condition  Airway Management Planned: LMA  Additional Equipment: None  Intra-op Plan:   Post-operative Plan: Extubation in OR  Informed Consent: I have reviewed the patients History and Physical, chart, labs and discussed the procedure including the risks, benefits and alternatives for the proposed anesthesia with the patient or authorized representative who has indicated  his/her understanding and acceptance.     Dental advisory given  Plan Discussed with: CRNA  Anesthesia Plan Comments:         Anesthesia Quick Evaluation

## 2023-06-21 NOTE — Anesthesia Procedure Notes (Signed)
Procedure Name: LMA Insertion Date/Time: 06/21/2023 9:57 AM  Performed by: Shanon Payor, CRNAPre-anesthesia Checklist: Patient identified, Emergency Drugs available, Suction available, Patient being monitored and Timeout performed Patient Re-evaluated:Patient Re-evaluated prior to induction Oxygen Delivery Method: Circle system utilized Preoxygenation: Pre-oxygenation with 100% oxygen Induction Type: IV induction LMA: LMA inserted LMA Size: 5.0 Number of attempts: 1 Placement Confirmation: positive ETCO2, CO2 detector and breath sounds checked- equal and bilateral Tube secured with: Tape Dental Injury: Teeth and Oropharynx as per pre-operative assessment

## 2023-06-21 NOTE — Anesthesia Postprocedure Evaluation (Signed)
Anesthesia Post Note  Patient: Andre Clements  Procedure(s) Performed: CYSTOSCOPY, RIGHT URETEROSCOPY, HOLMIUM LASER LITHOTRIPSY, AND RIGHT URETERAL STENT EXCHANGE (Right)     Patient location during evaluation: PACU Anesthesia Type: General Level of consciousness: awake and alert, oriented and patient cooperative Pain management: pain level controlled Vital Signs Assessment: post-procedure vital signs reviewed and stable Respiratory status: spontaneous breathing, nonlabored ventilation and respiratory function stable Cardiovascular status: blood pressure returned to baseline and stable Postop Assessment: no apparent nausea or vomiting Anesthetic complications: no   No notable events documented.  Last Vitals:  Vitals:   06/21/23 0812 06/21/23 1049  BP: (!) 146/77 (!) 161/97  Pulse: 60 60  Resp: 18 19  Temp: 36.8 C (!) 35.9 C  SpO2: 97% 100%    Last Pain:  Vitals:   06/21/23 1049  TempSrc: Axillary  PainSc: Asleep                 Lannie Fields

## 2023-06-22 ENCOUNTER — Encounter (HOSPITAL_COMMUNITY): Payer: Self-pay | Admitting: Urology

## 2023-06-29 ENCOUNTER — Other Ambulatory Visit (HOSPITAL_COMMUNITY): Payer: Self-pay | Admitting: Nurse Practitioner

## 2023-06-29 DIAGNOSIS — N200 Calculus of kidney: Secondary | ICD-10-CM

## 2023-06-29 DIAGNOSIS — N261 Atrophy of kidney (terminal): Secondary | ICD-10-CM

## 2023-07-03 ENCOUNTER — Encounter (HOSPITAL_COMMUNITY)
Admission: RE | Admit: 2023-07-03 | Discharge: 2023-07-03 | Disposition: A | Discharge status: Home / Self Care | Payer: Medicare Other | Source: Ambulatory Visit | Attending: Nurse Practitioner | Admitting: Nurse Practitioner

## 2023-07-03 DIAGNOSIS — N200 Calculus of kidney: Secondary | ICD-10-CM | POA: Insufficient documentation

## 2023-07-03 DIAGNOSIS — N261 Atrophy of kidney (terminal): Secondary | ICD-10-CM | POA: Diagnosis present

## 2023-07-03 MED ORDER — FUROSEMIDE 10 MG/ML IJ SOLN
INTRAMUSCULAR | Status: AC
  Filled 2023-07-03: qty 8

## 2023-07-03 MED ORDER — TECHNETIUM TC 99M MERTIATIDE
4.8000 | Freq: Once | INTRAVENOUS | Status: AC
  Administered 2023-07-03: 4.8 via INTRAVENOUS

## 2023-07-03 MED ORDER — FUROSEMIDE 10 MG/ML IJ SOLN: 48.0000 mg | Freq: Once | INTRAMUSCULAR | Status: DC

## 2023-08-07 ENCOUNTER — Other Ambulatory Visit: Payer: Self-pay | Admitting: Urology

## 2023-08-08 ENCOUNTER — Other Ambulatory Visit (HOSPITAL_COMMUNITY): Payer: Self-pay | Admitting: Urology

## 2023-08-08 DIAGNOSIS — N200 Calculus of kidney: Secondary | ICD-10-CM

## 2023-08-17 ENCOUNTER — Other Ambulatory Visit (HOSPITAL_COMMUNITY): Payer: Self-pay | Admitting: *Deleted

## 2023-08-18 ENCOUNTER — Inpatient Hospital Stay (HOSPITAL_COMMUNITY): Admission: RE | Admit: 2023-08-18 | Payer: Medicare Other | Source: Ambulatory Visit

## 2023-08-18 ENCOUNTER — Ambulatory Visit (HOSPITAL_COMMUNITY): Payer: Medicare Other

## 2023-08-25 ENCOUNTER — Encounter (HOSPITAL_COMMUNITY): Payer: Medicare Other

## 2023-09-05 NOTE — Patient Instructions (Signed)
SURGICAL WAITING ROOM VISITATION Patients having surgery or a procedure may have no more than 2 support people in the waiting area - these visitors may rotate in the visitor waiting room.   Due to an increase in RSV and influenza rates and associated hospitalizations, children ages 9 and under may not visit patients in [facility] hospitals. If the patient needs to stay at the hospital during part of their recovery, the visitor guidelines for inpatient rooms apply.  PRE-OP VISITATION  Pre-op nurse will coordinate an appropriate time for 1 support person to accompany the patient in pre-op.  This support person may not rotate.  This visitor will be contacted when the time is appropriate for the visitor to come back in the pre-op area.  Please refer to the [facility] website for the visitor guidelines for Inpatients (after your surgery is over and you are in a regular room).  You are not required to quarantine at this time prior to your surgery. However, you must do this: Hand Hygiene often Do NOT share personal items Notify your provider if you are in close contact with someone who has COVID or you develop fever 100.4 or greater, new onset of sneezing, cough, sore throat, shortness of breath or body aches.  If you test positive for Covid or have been in contact with anyone that has tested positive in the last 10 days please notify you surgeon.    Your procedure is scheduled on:  Monday  September 18, 2023  Report to [facility] Main Entrance: [place] entrance where the [employer] is available.   Report to admitting at: 07:30    AM  Call this number if you have any questions or problems the morning of surgery [phone]  DO NOT EAT OR DRINK ANYTHING AFTER MIDNIGHT THE NIGHT PRIOR TO YOUR SURGERY / PROCEDURE.   FOLLOW BOWEL PREP AND ANY ADDITIONAL PRE OP INSTRUCTIONS YOU RECEIVED FROM YOUR SURGEON'S OFFICE!!!   Oral Hygiene is also important to reduce your risk of infection.         Remember - BRUSH YOUR TEETH THE MORNING OF SURGERY WITH YOUR REGULAR TOOTHPASTE  Do NOT smoke after Midnight the night before surgery.  STOP TAKING all Vitamins, Herbs and supplements 1 week before your surgery.   Diabetic medication:  Farxiga 5 mg;  Hold x 72 hours. You will take the last dose on Thursday  09-14-2023.  Metformin 1000mg  daily:  Day Before surgery;  Take your usual dose of Metformin                                              DAY OF SURGERY: DO NOT TAKE METFORMIN  Take ONLY these medicines the morning of surgery with A SIP OF WATER: None   because you take all medications at 6:00pm                     You may not have any metal on your body including  jewelry, and body piercing  Do not wear  lotions, powders,  cologne, or deodorant  Men may shave face and neck.  Contacts, Hearing Aids, dentures or bridgework may not be worn into surgery. DENTURES WILL BE REMOVED PRIOR TO SURGERY PLEASE DO NOT APPLY "Poly grip" OR ADHESIVES!!!  You may bring a small overnight bag with you on the day of  surgery, only pack items that are not valuable. [place] [facility] IS NOT RESPONSIBLE   FOR VALUABLES THAT ARE LOST OR STOLEN.    Do not bring your home medications to the hospital. The Pharmacy will dispense medications listed on your medication list to you during your admission in the Hospital.  Please read over the following fact sheets you were given: IF YOU HAVE QUESTIONS ABOUT YOUR PRE-OP INSTRUCTIONS, PLEASE CALL [phone].   [facility] - Preparing for Surgery Before surgery, you can play an important role.  Because skin is not sterile, your skin needs to be as free of germs as possible.  You can reduce the number of germs on your skin by washing with CHG (chlorahexidine gluconate) soap before surgery.  CHG is an antiseptic cleaner which kills germs and bonds with the skin to continue killing germs even after washing. Please DO NOT use if you have an allergy to CHG or  antibacterial soaps.  If your skin becomes reddened/irritated stop using the CHG and inform your nurse when you arrive at Short Stay. Do not shave (including legs and underarms) for at least 48 hours prior to the first CHG shower.  You may shave your face/neck.  Please follow these instructions carefully:  1.  Shower with CHG Soap the night before surgery and the  morning of surgery.  2.  If you choose to wash your hair, wash your hair first as usual with your normal  shampoo.  3.  After you shampoo, rinse your hair and body thoroughly to remove the shampoo.                             4.  Use CHG as you would any other liquid soap.  You can apply chg directly to the skin and wash.  Gently with a scrungie or clean washcloth.  5.  Apply the CHG Soap to your body ONLY FROM THE NECK DOWN.   Do not use on face/ open                           Wound or open sores. Avoid contact with eyes, ears mouth and genitals (private parts).                       Wash face,  Genitals (private parts) with your normal soap.             6.  Wash thoroughly, paying special attention to the area where your  surgery  will be performed.  7.  Thoroughly rinse your body with warm water from the neck down.  8.  DO NOT shower/wash with your normal soap after using and rinsing off the CHG Soap.            9.  Pat yourself dry with a clean towel.            10.  Wear clean pajamas.            11.  Place clean sheets on your bed the night of your first shower and do not  sleep with pets.  ON THE DAY OF SURGERY : Do not apply any lotions/deodorants the morning of surgery.  Please wear clean clothes to the hospital/surgery center.    FAILURE TO FOLLOW THESE INSTRUCTIONS MAY RESULT IN THE CANCELLATION OF YOUR SURGERY  PATIENT SIGNATURE_________________________________  NURSE SIGNATURE__________________________________  ________________________________________________________________________

## 2023-09-05 NOTE — Progress Notes (Signed)
COVID Vaccine received:  []  No [x]  Yes Date of any COVID positive Test in last 23 days:None  PCP - Debria Garret, NP Cardiologist -  None  Chest x-ray - 05-24-2023   2v   Epic EKG -  05-24-2023  Epic Stress Test -  ECHO -  Cardiac Cath -   PCR screen: []  Ordered & Completed           []   No Order but Needs PROFEND           [x]   N/A for this surgery  Surgery Plan:  []  Ambulatory   [x]  Outpatient in bed  []  Admit  Anesthesia:    [x]  General  []  Spinal  []   Choice []   MAC  Bowel Prep - [x]  No  []   Yes ______  Pacemaker / ICD device [x]  No []  Yes   Spinal Cord Stimulator:[x]  No []  Yes       History of Sleep Apnea? [x]  No []  Yes   CPAP used?- [x]  No []  Yes    Does the patient monitor blood sugar?  [x]  No []  Yes  []  N/A  Patient has: []  NO Hx DM   [x]  Pre-DM   []  DM1  []   DM2 Last A1c was: 6.2 on  05-25-2023    Does patient have a [employer] or Dexacom? [x]  No []  Yes   Fasting Blood Sugar Ranges-  Checks Blood Sugar _0_ times a day  SGLT-2 inhibitors / usual dose - Farxiga 5 mg    SGLT-2 instructions: Hold x 72 hours  Last dose will be taken on: Thursday 09-14-2023  Diabetic medications/ instructions: Metformin 1000 mg q day (takes at 1800)  Blood Thinner / Instructions:None Aspirin Instructions:None  ERAS Protocol Ordered: [x]  No  []  Yes Patient is to be NPO after: Midnight prior  Activity level: Patient is unable to climb a flight of stairs without difficulty d/t paraplegia.  Patient can not perform ADLs without assistance.   Anesthesia review: BLE paraplegia, HTN, bipolar 2 anxiety, anemia, Pre-DM, neurogenic bladder.     Patient denies shortness of breath, fever, cough and chest pain at PAT appointment.  Patient verbalized understanding and agreement to the Pre-Surgical Instructions that were given to them at this PAT appointment. Patient was also educated of the need to review these PAT instructions again prior to his/her surgery.I reviewed the appropriate phone  numbers to call if they have any and questions or concerns.

## 2023-09-06 ENCOUNTER — Encounter (HOSPITAL_COMMUNITY): Payer: Self-pay

## 2023-09-06 ENCOUNTER — Encounter (HOSPITAL_COMMUNITY)
Admission: RE | Admit: 2023-09-06 | Discharge: 2023-09-06 | Disposition: A | Discharge status: Home / Self Care | Payer: Medicare Other | Source: Ambulatory Visit | Attending: Urology | Admitting: Urology

## 2023-09-06 ENCOUNTER — Other Ambulatory Visit: Payer: Self-pay

## 2023-09-06 DIAGNOSIS — E119 Type 2 diabetes mellitus without complications: Secondary | ICD-10-CM | POA: Diagnosis not present

## 2023-09-06 DIAGNOSIS — N132 Hydronephrosis with renal and ureteral calculous obstruction: Secondary | ICD-10-CM | POA: Diagnosis not present

## 2023-09-06 DIAGNOSIS — Z01812 Encounter for preprocedural laboratory examination: Secondary | ICD-10-CM | POA: Insufficient documentation

## 2023-09-06 DIAGNOSIS — I10 Essential (primary) hypertension: Secondary | ICD-10-CM | POA: Diagnosis not present

## 2023-09-06 DIAGNOSIS — Z01818 Encounter for other preprocedural examination: Secondary | ICD-10-CM | POA: Diagnosis present

## 2023-09-06 LAB — TYPE AND SCREEN
ABO/RH(D): B POS
Antibody Screen: NEGATIVE

## 2023-09-06 LAB — BASIC METABOLIC PANEL WITH GFR
Anion gap: 14 (ref 5–15)
BUN: 31 mg/dL — ABNORMAL HIGH (ref 6–20)
CO2: 24 mmol/L (ref 22–32)
Calcium: 9.5 mg/dL (ref 8.9–10.3)
Chloride: 103 mmol/L (ref 98–111)
Creatinine, Ser: 1.57 mg/dL — ABNORMAL HIGH (ref 0.61–1.24)
GFR, Estimated: 53 mL/min — ABNORMAL LOW (ref >60)
Glucose, Bld: 104 mg/dL — ABNORMAL HIGH (ref 70–99)
Potassium: 3.7 mmol/L (ref 3.5–5.1)
Sodium: 141 mmol/L (ref 135–145)

## 2023-09-06 LAB — CBC
HCT: 33 % — ABNORMAL LOW (ref 39.0–52.0)
Hemoglobin: 9.6 g/dL — ABNORMAL LOW (ref 13.0–17.0)
MCH: 19.5 pg — ABNORMAL LOW (ref 26.0–34.0)
MCHC: 29.1 g/dL — ABNORMAL LOW (ref 30.0–36.0)
MCV: 66.9 fL — ABNORMAL LOW (ref 80.0–100.0)
Platelets: 323 10*3/uL (ref 150–400)
RBC: 4.93 MIL/uL (ref 4.22–5.81)
RDW: 17.7 % — ABNORMAL HIGH (ref 11.5–15.5)
WBC: 9.3 10*3/uL (ref 4.0–10.5)
nRBC: 0 % (ref 0.0–0.2)

## 2023-09-07 ENCOUNTER — Ambulatory Visit (HOSPITAL_COMMUNITY): Payer: Medicare Other | Admitting: Anesthesiology

## 2023-09-07 ENCOUNTER — Ambulatory Visit (HOSPITAL_COMMUNITY): Payer: Medicare Other | Admitting: Medical

## 2023-09-07 NOTE — Progress Notes (Signed)
Anesthesia Review:   DISCUSSION: Andre Clements is a 52 yo male who presents to PAT prior to LEFT NEPHROLITHOTOMY PERCUTANEOUS on 09/18/23 with Dr. Alvester Morin. PMH of HTN, schizophrenia, paraplegia s/p spinal cord injury d/t suicide attempt in 1998, wheelchair dependent, chronic indwelling foley d/t neurogenic bladder, Diabetes, GERD, CKD, kidney stones  Admitted from 6/5-6/9 due to 1 cm right ureteral stone with hydronephrosis and a large left staghorn calculus without obstruction. Had significant AKI (SCr was 11).  Nephrology and Urology were consulted. Went to the OR on 6/6 for cystoscopy, right ureteroscopy and stent placement. AKI resolved but pt likely has some underlying CKD.  Pt went back to the OR on 7/3 for lithotripsy and R stent exchange. Advised to have PCNL to address his left-sided staghorn stone.    VS: BP 115/75 Comment: right arm sitting  Temp 37.1 C (Oral)   Resp 14   Ht 5\' 8"  (1.727 m)   Wt 99.8 kg   SpO2 97%   BMI 33.45 kg/m   PROVIDERS: Estevan Oaks, NP   LABS: Labs reviewed: Acceptable for surgery. (all labs ordered are listed, but only abnormal results are displayed)  Labs Reviewed  CBC - Abnormal; Notable for the following components:      Result Value   Hemoglobin 9.6 (*)    HCT 33.0 (*)    MCV 66.9 (*)    MCH 19.5 (*)    MCHC 29.1 (*)    RDW 17.7 (*)    All other components within normal limits  BASIC METABOLIC PANEL - Abnormal; Notable for the following components:   Glucose, Bld 104 (*)    BUN 31 (*)    Creatinine, Ser 1.57 (*)    GFR, Estimated 53 (*)    All other components within normal limits  TYPE AND SCREEN     IMAGES:  CT A/P 05/24/23:  IMPRESSION: 1. Moderate obstructive uropathy on the right with a 1 cm calculus in the distal right ureter in the pelvis. 2. 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 There is perinephric fat stranding bilaterally and along the proximal right ureter which may be infectious or  inflammatory. 3. Remaining incidental findings as described above.     EKG:   CV:  Past Medical History:  Diagnosis Date   Anemia    Anxiety    Bipolar 2 disorder (HCC)    Blood in stool    Chronic kidney disease    Depression    History of kidney stones    History of spinal cord injury     form accident,paralysis of BLE   Hyperlipidemia    Hypertension    Neurogenic bladder    Paralysis (HCC)    paraplegic since 1998   Urine incontinence     Past Surgical History:  Procedure Laterality Date   ABDOMINAL HERNIA REPAIR  12/19/2001   arm surgery Right    from suicide attempt   CYSTOSCOPY WITH LITHOLAPAXY N/A 08/26/2018   Procedure: CYSTOSCOPY WITH LITHOLAPAXY;  Surgeon: Rene Paci, MD;  Location: [facility];  Service: Urology;  Laterality: N/A;   CYSTOSCOPY WITH RETROGRADE PYELOGRAM, URETEROSCOPY AND STENT PLACEMENT Right 05/25/2023   Procedure: CYSTOSCOPY WITH RETROGRADE PYELOGRAM, URETEROSCOPY AND STENT PLACEMENT;  Surgeon: Heloise Purpura, MD;  Location: [facility];  Service: Urology;  Laterality: Right;   CYSTOSCOPY/URETEROSCOPY/HOLMIUM LASER/STENT PLACEMENT Right 06/21/2023   Procedure: CYSTOSCOPY, RIGHT URETEROSCOPY, HOLMIUM LASER LITHOTRIPSY, AND RIGHT URETERAL STENT EXCHANGE;  Surgeon: Rene Paci, MD;  Location: [facility];  Service: Urology;  Laterality: Right;   WISDOM TOOTH EXTRACTION      MEDICATIONS:  Ascorbic Acid (VITAMIN C) 250 MG CHEW   benztropine (COGENTIN) 0.5 MG tablet   cholecalciferol (VITAMIN D3) 25 MCG (1000 UNIT) tablet   dapagliflozin propanediol (FARXIGA) 5 MG TABS tablet   ferrous sulfate 325 (65 FE) MG tablet   finasteride (PROSCAR) 5 MG tablet   haloperidol (HALDOL) 5 MG tablet   hydrochlorothiazide (HYDRODIURIL) 12.5 MG tablet   lisinopril (ZESTRIL) 40 MG tablet   metFORMIN (GLUCOPHAGE-XR) 500 MG 24 hr tablet   Multiple Vitamins-Minerals (CENTRUM VITAMINTS PO)   omeprazole (PRILOSEC) 40 MG capsule   sertraline  (ZOLOFT) 100 MG tablet   simvastatin (ZOCOR) 40 MG tablet   No current facility-administered medications for this encounter.   Marcille Blanco [facility] Short Stay/Anesthesiology [place] Phone [phone] 09/07/2023 3:09 PM

## 2023-09-07 NOTE — Anesthesia Preprocedure Evaluation (Addendum)
Anesthesia Evaluation  Patient identified by MRN, date of birth, ID band Patient awake    Reviewed: Allergy & Precautions, H&P , NPO status , Patient's Chart, lab work & pertinent test results  Airway Mallampati: II  TM Distance: >3 FB Neck ROM: Full    Dental  (+) Dental Advisory Given, Poor Dentition, Chipped, Missing   Pulmonary neg pulmonary ROS   Pulmonary exam normal breath sounds clear to auscultation       Cardiovascular hypertension, Pt. on medications and Pt. on home beta blockers Normal cardiovascular exam Rhythm:Regular Rate:Normal     Neuro/Psych  PSYCHIATRIC DISORDERS Anxiety Depression Bipolar Disorder   Hx spinal cord injury B/L LE paralysis 1998 Uses powerchair, but able to transfer himself   Neuromuscular disease    GI/Hepatic Neg liver ROS,GERD  Medicated and Controlled,,  Endo/Other  diabetes, Well Controlled, Type 2, Oral Hypoglycemic Agents  FS 99 in preop  Renal/GU Renal disease  negative genitourinary   Musculoskeletal negative musculoskeletal ROS (+)    Abdominal  (+) + obese  Peds negative pediatric ROS (+)  Hematology negative hematology ROS (+) Blood dyscrasia, anemia   Anesthesia Other Findings   Reproductive/Obstetrics negative OB ROS                              Anesthesia Physical Anesthesia Plan  ASA: 3  Anesthesia Plan: General   Post-op Pain Management: Tylenol PO (pre-op)*   Induction: Intravenous  PONV Risk Score and Plan: 3 and Ondansetron, Dexamethasone, Midazolam and Treatment may vary due to age or medical condition  Airway Management Planned: Oral ETT  Additional Equipment: None  Intra-op Plan:   Post-operative Plan: Extubation in OR  Informed Consent: I have reviewed the patients History and Physical, chart, labs and discussed the procedure including the risks, benefits and alternatives for the proposed anesthesia with the patient  or authorized representative who has indicated his/her understanding and acceptance.     Dental advisory given  Plan Discussed with: CRNA  Anesthesia Plan Comments: (See PAT note from 9/18 by Sherlie Ban PA-C )         Anesthesia Quick Evaluation

## 2023-09-14 ENCOUNTER — Other Ambulatory Visit: Payer: Self-pay | Admitting: Radiology

## 2023-09-14 DIAGNOSIS — N200 Calculus of kidney: Secondary | ICD-10-CM

## 2023-09-17 NOTE — Consult Note (Signed)
Chief Complaint: Patient was seen in consultation today for left percutaneous nephrostomy/nephroureteral catheter placement    Referring Physician(s): Bell,E  Supervising Physician: Irish Lack  Patient Status: [facility] - Out-pt  TBA  History of Present Illness: Andre Clements is a 52 y.o. 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  He is scheduled today for left PCN/nephroureteral catheter placement prior to PCNL.   Past Medical History:  Diagnosis Date   Anemia    Anxiety    Bipolar 2 disorder (HCC)    Blood in stool    Chronic kidney disease    Depression    History of kidney stones    History of spinal cord injury     form accident,paralysis of BLE   Hyperlipidemia    Hypertension    Neurogenic bladder    Paralysis (HCC)    paraplegic since 1998   Urine incontinence     Past Surgical History:  Procedure Laterality Date   ABDOMINAL HERNIA REPAIR  12/19/2001   arm surgery Right    from suicide attempt   CYSTOSCOPY WITH LITHOLAPAXY N/A 08/26/2018   Procedure: CYSTOSCOPY WITH LITHOLAPAXY;  Surgeon: Rene Paci, MD;  Location: [facility];  Service: Urology;  Laterality: N/A;   CYSTOSCOPY WITH RETROGRADE PYELOGRAM, URETEROSCOPY AND STENT PLACEMENT Right 05/25/2023   Procedure: CYSTOSCOPY WITH RETROGRADE PYELOGRAM, URETEROSCOPY AND STENT PLACEMENT;  Surgeon: Heloise Purpura, MD;  Location: [facility];  Service: Urology;  Laterality: Right;   CYSTOSCOPY/URETEROSCOPY/HOLMIUM LASER/STENT PLACEMENT Right 06/21/2023   Procedure: CYSTOSCOPY, RIGHT URETEROSCOPY, HOLMIUM LASER LITHOTRIPSY, AND RIGHT URETERAL STENT EXCHANGE;  Surgeon: Rene Paci, MD;  Location: [facility];  Service: Urology;  Laterality: Right;   WISDOM TOOTH EXTRACTION      Allergies: Other  Medications: Prior to  Admission medications   Medication Sig Start Date End Date Taking? Authorizing Provider  Ascorbic Acid (VITAMIN C) 250 MG CHEW Chew 250 each by mouth daily at 6 PM.   Yes [provider]  benztropine (COGENTIN) 0.5 MG tablet Take 0.5 mg by mouth daily at 6 PM. 03/25/23  Yes [provider]  cholecalciferol (VITAMIN D3) 25 MCG (1000 UNIT) tablet Take 1,000 Units by mouth daily at 6 PM.   Yes [provider]  dapagliflozin propanediol (FARXIGA) 5 MG TABS tablet Take 5 mg by mouth daily at 6 PM.   Yes [provider]  ferrous sulfate 325 (65 FE) MG tablet Take 325 mg by mouth daily at 6 PM.   Yes [provider]  finasteride (PROSCAR) 5 MG tablet Take 1 tablet (5 mg total) by mouth daily. Patient taking differently: Take 5 mg by mouth daily at 6 PM. 04/27/21  Yes Myrlene Broker, MD  haloperidol (HALDOL) 5 MG tablet Take 1 tablet (5 mg total) by mouth daily. Patient taking differently: Take 5 mg by mouth daily at 6 PM. 04/27/21  Yes Myrlene Broker, MD  hydrochlorothiazide (HYDRODIURIL) 12.5 MG tablet Take 12.5 mg by mouth daily at 6 PM.   Yes [provider]  lisinopril (ZESTRIL) 40 MG tablet Take 40 mg by mouth daily at 6 PM. 06/08/23  Yes [provider]  metFORMIN (GLUCOPHAGE-XR) 500 MG 24 hr tablet Take 1 tablet (500 mg total) by mouth daily with breakfast. Patient taking differently: Take 1,000 mg by mouth daily at 6 PM. 04/27/21  Yes Myrlene Broker, MD  Multiple Vitamins-Minerals (CENTRUM VITAMINTS PO) Take 1 tablet by mouth daily.   Yes [provider]  omeprazole (PRILOSEC) 40 MG capsule Take 1 capsule (40 mg total) by mouth daily. Patient taking differently: Take 40 mg by mouth daily at 6 PM. 04/27/21  Yes Myrlene Broker, MD  sertraline (ZOLOFT) 100 MG tablet Take 2 tablets (200 mg total) by mouth daily. Patient taking differently: Take 200 mg by mouth daily at 6 PM. 04/27/21  Yes Myrlene Broker, MD  simvastatin (ZOCOR) 40 MG tablet Take 1 tablet (40 mg total) by mouth daily at 6 PM. 04/27/21  Yes Myrlene Broker, MD     Family History  Problem Relation Age of Onset   Alcohol abuse Mother    Hypertension Mother    Alcohol abuse Father    Other Neg Hx        hypogonadism   Stomach cancer Neg Hx    Colon cancer Neg Hx    Esophageal cancer Neg Hx     Social History   Socioeconomic History   Marital status: Single    Spouse name: Not on file   Number of children: 0   Years of education: Not on file   Highest education level: Not on file  Occupational History   Occupation: disability   Occupation: retired  Tobacco Use   Smoking status: Never   Smokeless tobacco: Never  Vaping Use   Vaping status: Never Used  Substance and Sexual Activity   Alcohol use: No    Alcohol/week: 0.0 standard drinks of alcohol   Drug use: No   Sexual activity: Not Currently  Other Topics Concern   Not on file  Social History Narrative   Not on file   Social Determinants of Health   Financial Resource Strain: Low Risk  (10/28/2020)   Overall Financial Resource Strain (CARDIA)    Difficulty of Paying Living Expenses: Not hard at all  Food Insecurity: No Food Insecurity (05/26/2023)   Hunger Vital Sign    Worried About Running Out of Food in the Last Year: Never true    Ran Out of Food in the Last Year: Never true  Transportation Needs: No Transportation Needs (05/26/2023)   PRAPARE - Administrator, Civil Service (Medical): No    Lack of Transportation (Non-Medical): No  Physical Activity: Inactive (10/28/2020)   Exercise Vital Sign    Days of Exercise per Week: 0 days    Minutes of Exercise per Session: 0 min  Stress: No Stress Concern Present (10/28/2020)   [employer] of Occupational Health - Occupational Stress Questionnaire    Feeling of Stress : Not at all  Social Connections: Unknown (10/28/2020)   Social Connection and Isolation Panel [NHANES]     Frequency of Communication with Friends and Family: More than three times a week    Frequency of Social Gatherings with Friends and Family: More than three times a week    Attends Religious Services: Patient declined    Database administrator or Organizations: Patient declined    Attends Banker Meetings: Patient declined    Marital Status: Never married      Review of Systems  Vital Signs:   Code Status:     Physical Exam  Imaging: No results found.  Labs:  CBC: Recent Labs    05/27/23 0658 05/28/23 0636 06/21/23 0820 09/06/23 0813  WBC 8.4 8.8 8.1 9.3  HGB 9.1* 8.3* 10.2* 9.6*  HCT 29.4*  28.4* 35.0* 33.0*  PLT 422* 435* 264 323    COAGS: Recent Labs    05/24/23 1815  INR 1.2    BMP: Recent Labs    05/27/23 0658 05/28/23 0636 06/21/23 0928 09/06/23 0813  NA 138 140 138 141  K 3.7 3.9 3.5 3.7  CL 97* 102 110 103  CO2 29 28 21* 24  GLUCOSE 97 100* 101* 104*  BUN 55* 40* 24* 31*  CALCIUM 7.9* 8.3* 8.5* 9.5  CREATININE 2.85* 2.29* 1.59* 1.57*  GFRNONAA 26* 34* 53* 53*    LIVER FUNCTION TESTS: Recent Labs    05/24/23 1748 05/27/23 0658 05/28/23 0636  BILITOT 0.3  --   --   AST 10*  --   --   ALT 13  --   --   ALKPHOS 81  --   --   PROT 9.1*  --   --   ALBUMIN 3.2* 2.5* 2.7*    TUMOR MARKERS: No results for input(s): "AFPTM", "CEA", "CA199", "CHROMGRNA" in the last 8760 hours.  Assessment and Plan: 52 y.o. 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  He is scheduled today for left PCN/nephroureteral catheter placement prior to PCNL. Risks and benefits of left PCN placement was discussed with the patient including, but not limited to, infection, bleeding, significant bleeding causing loss or decrease in renal function or damage to adjacent structures.    All of the patient's questions were answered, patient is agreeable to proceed.  Consent signed and in chart.      Thank you for this interesting consult.  I greatly enjoyed meeting Andre Clements and look forward to participating in their care.  A copy of this report was sent to the requesting provider on this date.  Electronically Signed: D. Jeananne Rama, PA-C 09/17/2023, 10:36 AM   I spent a total of   25 minutes  in face to face in clinical consultation, greater than 50% of which was counseling/coordinating care for left percutaneous nephrostomy/nephroureteral catheter placement

## 2023-09-18 ENCOUNTER — Ambulatory Visit (HOSPITAL_COMMUNITY)
Admission: RE | Admit: 2023-09-18 | Discharge: 2023-09-18 | Disposition: A | Discharge status: Home / Self Care | Payer: Medicare Other | Source: Ambulatory Visit | Attending: Urology

## 2023-09-18 ENCOUNTER — Encounter (HOSPITAL_COMMUNITY): Payer: Self-pay | Admitting: Urology

## 2023-09-18 ENCOUNTER — Other Ambulatory Visit: Payer: Self-pay

## 2023-09-18 ENCOUNTER — Ambulatory Visit (HOSPITAL_COMMUNITY)
Admission: RE | Admit: 2023-09-18 | Discharge: 2023-09-18 | Disposition: A | Discharge status: Home / Self Care | Payer: Medicare Other | Source: Ambulatory Visit | Attending: Urology | Admitting: Urology

## 2023-09-18 ENCOUNTER — Encounter (HOSPITAL_COMMUNITY): Payer: Self-pay

## 2023-09-18 ENCOUNTER — Observation Stay (HOSPITAL_COMMUNITY)
Admission: RE | Admit: 2023-09-18 | Discharge: 2023-09-21 | Disposition: A | Discharge status: Home / Self Care | Payer: Medicare Other | Attending: Urology | Admitting: Urology

## 2023-09-18 ENCOUNTER — Encounter (HOSPITAL_COMMUNITY)
Admission: RE | Disposition: A | Discharge status: Home / Self Care | Payer: Self-pay | Source: Home / Self Care | Attending: Urology

## 2023-09-18 DIAGNOSIS — I10 Essential (primary) hypertension: Secondary | ICD-10-CM | POA: Diagnosis not present

## 2023-09-18 DIAGNOSIS — Z79899 Other long term (current) drug therapy: Secondary | ICD-10-CM | POA: Diagnosis not present

## 2023-09-18 DIAGNOSIS — N200 Calculus of kidney: Principal | ICD-10-CM

## 2023-09-18 DIAGNOSIS — Z9889 Other specified postprocedural states: Secondary | ICD-10-CM | POA: Diagnosis not present

## 2023-09-18 HISTORY — PX: IR URETERAL STENT LEFT NEW ACCESS W/O SEP NEPHROSTOMY CATH: IMG6075

## 2023-09-18 LAB — CBC WITH DIFFERENTIAL/PLATELET
Abs Immature Granulocytes: 0.02 10*3/uL (ref 0.00–0.07)
Basophils Absolute: 0 10*3/uL (ref 0.0–0.1)
Basophils Relative: 0 %
Eosinophils Absolute: 0.2 10*3/uL (ref 0.0–0.5)
Eosinophils Relative: 2 %
HCT: 34.2 % — ABNORMAL LOW (ref 39.0–52.0)
Hemoglobin: 10 g/dL — ABNORMAL LOW (ref 13.0–17.0)
Immature Granulocytes: 0 %
Lymphocytes Relative: 26 %
Lymphs Abs: 2 10*3/uL (ref 0.7–4.0)
MCH: 19.8 pg — ABNORMAL LOW (ref 26.0–34.0)
MCHC: 29.2 g/dL — ABNORMAL LOW (ref 30.0–36.0)
MCV: 67.7 fL — ABNORMAL LOW (ref 80.0–100.0)
Monocytes Absolute: 0.7 10*3/uL (ref 0.1–1.0)
Monocytes Relative: 8 %
Neutro Abs: 5 10*3/uL (ref 1.7–7.7)
Neutrophils Relative %: 64 %
Platelets: 333 10*3/uL (ref 150–400)
RBC: 5.05 MIL/uL (ref 4.22–5.81)
RDW: 18.8 % — ABNORMAL HIGH (ref 11.5–15.5)
WBC: 7.9 10*3/uL (ref 4.0–10.5)
nRBC: 0 % (ref 0.0–0.2)

## 2023-09-18 LAB — BASIC METABOLIC PANEL
Anion gap: 11 (ref 5–15)
BUN: 28 mg/dL — ABNORMAL HIGH (ref 6–20)
CO2: 25 mmol/L (ref 22–32)
Calcium: 9.4 mg/dL (ref 8.9–10.3)
Chloride: 103 mmol/L (ref 98–111)
Creatinine, Ser: 1.38 mg/dL — ABNORMAL HIGH (ref 0.61–1.24)
GFR, Estimated: >60 mL/min (ref >60)
Glucose, Bld: 101 mg/dL — ABNORMAL HIGH (ref 70–99)
Potassium: 3.6 mmol/L (ref 3.5–5.1)
Sodium: 139 mmol/L (ref 135–145)

## 2023-09-18 LAB — GLUCOSE, CAPILLARY: Glucose-Capillary: 91 mg/dL (ref 70–99)

## 2023-09-18 LAB — ABO/RH: ABO/RH(D): B POS

## 2023-09-18 LAB — PROTIME-INR
INR: 1 (ref 0.8–1.2)
Prothrombin Time: 13.2 s (ref 11.4–15.2)

## 2023-09-18 SURGERY — NEPHROLITHOTOMY PERCUTANEOUS
Anesthesia: General | Laterality: Left

## 2023-09-18 MED ORDER — LIDOCAINE HCL URETHRAL/MUCOSAL 2 % EX GEL
1.0000 | Freq: Once | CUTANEOUS | Status: DC
  Filled 2023-09-18: qty 5

## 2023-09-18 MED ORDER — ONDANSETRON HCL 4 MG/2ML IJ SOLN: 4.0000 mg | INTRAMUSCULAR | Status: DC | PRN

## 2023-09-18 MED ORDER — LIDOCAINE HCL (PF) 2 % IJ SOLN
INTRAMUSCULAR | Status: AC
  Filled 2023-09-18: qty 5

## 2023-09-18 MED ORDER — VITAMIN C 250 MG PO CHEW: 250.0000 | CHEWABLE_TABLET | Freq: Every day | ORAL | Status: DC

## 2023-09-18 MED ORDER — IOHEXOL 300 MG/ML  SOLN
50.0000 mL | Freq: Once | INTRAMUSCULAR | Status: AC | PRN
  Administered 2023-09-18: 30 mL

## 2023-09-18 MED ORDER — FENTANYL CITRATE (PF) 100 MCG/2ML IJ SOLN
INTRAMUSCULAR | Status: AC | PRN
Start: 2023-09-18 — End: 2023-09-18
  Administered 2023-09-18 (×2): 50 ug via INTRAVENOUS

## 2023-09-18 MED ORDER — HYDROMORPHONE HCL 1 MG/ML IJ SOLN: 0.5000 mg | INTRAMUSCULAR | Status: DC | PRN

## 2023-09-18 MED ORDER — SODIUM CHLORIDE 0.9 % IV SOLN
2.0000 g | Freq: Once | INTRAVENOUS | Status: AC
  Administered 2023-09-18: 2 g via INTRAVENOUS
  Filled 2023-09-18: qty 20

## 2023-09-18 MED ORDER — LIDOCAINE HCL 1 % IJ SOLN
10.0000 mL | Freq: Once | INTRAMUSCULAR | Status: AC
  Administered 2023-09-18: 10 mL via INTRADERMAL

## 2023-09-18 MED ORDER — CEFAZOLIN SODIUM-DEXTROSE 2-4 GM/100ML-% IV SOLN: 2.0000 g | INTRAVENOUS | Status: DC

## 2023-09-18 MED ORDER — VITAMIN D 25 MCG (1000 UNIT) PO TABS
1000.0000 [IU] | ORAL_TABLET | Freq: Every day | ORAL | Status: DC
  Administered 2023-09-18 – 2023-09-20 (×3): 1000 [IU] via ORAL
  Filled 2023-09-18 (×3): qty 1

## 2023-09-18 MED ORDER — ACETAMINOPHEN 325 MG PO TABS
650.0000 mg | ORAL_TABLET | ORAL | Status: DC | PRN
  Administered 2023-09-18 – 2023-09-19 (×3): 650 mg via ORAL
  Filled 2023-09-18 (×3): qty 2

## 2023-09-18 MED ORDER — CENTRUM VITAMINTS PO CHEW: CHEWABLE_TABLET | Freq: Every day | ORAL | Status: DC

## 2023-09-18 MED ORDER — FENTANYL CITRATE (PF) 100 MCG/2ML IJ SOLN
INTRAMUSCULAR | Status: AC
  Filled 2023-09-18: qty 2

## 2023-09-18 MED ORDER — OXYCODONE HCL 5 MG PO TABS: 5.0000 mg | ORAL_TABLET | ORAL | Status: DC | PRN

## 2023-09-18 MED ORDER — LIDOCAINE-EPINEPHRINE 1 %-1:100000 IJ SOLN
INTRAMUSCULAR | Status: AC
  Filled 2023-09-18: qty 1

## 2023-09-18 MED ORDER — PHENYLEPHRINE 80 MCG/ML (10ML) SYRINGE FOR IV PUSH (FOR BLOOD PRESSURE SUPPORT)
PREFILLED_SYRINGE | INTRAVENOUS | Status: AC
  Filled 2023-09-18: qty 10

## 2023-09-18 MED ORDER — DEXAMETHASONE SODIUM PHOSPHATE 10 MG/ML IJ SOLN
INTRAMUSCULAR | Status: AC
  Filled 2023-09-18: qty 1

## 2023-09-18 MED ORDER — HYDROCHLOROTHIAZIDE 12.5 MG PO TABS
12.5000 mg | ORAL_TABLET | Freq: Every day | ORAL | Status: DC
  Administered 2023-09-18 – 2023-09-20 (×3): 12.5 mg via ORAL
  Filled 2023-09-18 (×3): qty 1

## 2023-09-18 MED ORDER — LISINOPRIL 20 MG PO TABS
40.0000 mg | ORAL_TABLET | Freq: Every day | ORAL | Status: DC
  Administered 2023-09-18 – 2023-09-20 (×3): 40 mg via ORAL
  Filled 2023-09-18 (×3): qty 2

## 2023-09-18 MED ORDER — FINASTERIDE 5 MG PO TABS
5.0000 mg | ORAL_TABLET | Freq: Every day | ORAL | Status: DC
  Administered 2023-09-18 – 2023-09-20 (×3): 5 mg via ORAL
  Filled 2023-09-18 (×3): qty 1

## 2023-09-18 MED ORDER — LACTATED RINGERS IV SOLN: INTRAVENOUS | Status: DC

## 2023-09-18 MED ORDER — HALOPERIDOL 5 MG PO TABS
5.0000 mg | ORAL_TABLET | Freq: Every day | ORAL | Status: DC
  Administered 2023-09-18 – 2023-09-20 (×3): 5 mg via ORAL
  Filled 2023-09-18 (×4): qty 1

## 2023-09-18 MED ORDER — SODIUM CHLORIDE 0.9 % IV SOLN: INTRAVENOUS | Status: DC

## 2023-09-18 MED ORDER — DAPAGLIFLOZIN PROPANEDIOL 5 MG PO TABS
5.0000 mg | ORAL_TABLET | Freq: Every day | ORAL | Status: DC
  Administered 2023-09-18 – 2023-09-20 (×3): 5 mg via ORAL
  Filled 2023-09-18 (×4): qty 1

## 2023-09-18 MED ORDER — INSULIN ASPART 100 UNIT/ML IJ SOLN: 0.0000 [IU] | INTRAMUSCULAR | Status: DC | PRN

## 2023-09-18 MED ORDER — PROPOFOL 10 MG/ML IV BOLUS
INTRAVENOUS | Status: AC
  Filled 2023-09-18: qty 20

## 2023-09-18 MED ORDER — DOCUSATE SODIUM 100 MG PO CAPS
100.0000 mg | ORAL_CAPSULE | Freq: Two times a day (BID) | ORAL | Status: DC
  Administered 2023-09-18 – 2023-09-21 (×6): 100 mg via ORAL
  Filled 2023-09-18 (×6): qty 1

## 2023-09-18 MED ORDER — SIMVASTATIN 40 MG PO TABS
40.0000 mg | ORAL_TABLET | Freq: Every day | ORAL | Status: DC
  Administered 2023-09-18 – 2023-09-20 (×3): 40 mg via ORAL
  Filled 2023-09-18 (×3): qty 1

## 2023-09-18 MED ORDER — DIPHENHYDRAMINE HCL 12.5 MG/5ML PO ELIX: 12.5000 mg | ORAL_SOLUTION | Freq: Four times a day (QID) | ORAL | Status: DC | PRN

## 2023-09-18 MED ORDER — MIDAZOLAM HCL 2 MG/2ML IJ SOLN
INTRAMUSCULAR | Status: AC
  Filled 2023-09-18: qty 4

## 2023-09-18 MED ORDER — ACETAMINOPHEN 500 MG PO TABS
1000.0000 mg | ORAL_TABLET | Freq: Once | ORAL | Status: AC
  Administered 2023-09-18: 1000 mg via ORAL
  Filled 2023-09-18: qty 2

## 2023-09-18 MED ORDER — BENZTROPINE MESYLATE 0.5 MG PO TABS
0.5000 mg | ORAL_TABLET | Freq: Every day | ORAL | Status: DC
  Administered 2023-09-18 – 2023-09-20 (×3): 0.5 mg via ORAL
  Filled 2023-09-18 (×3): qty 1

## 2023-09-18 MED ORDER — FERROUS SULFATE 325 (65 FE) MG PO TABS
325.0000 mg | ORAL_TABLET | Freq: Every day | ORAL | Status: DC
  Administered 2023-09-18 – 2023-09-20 (×3): 325 mg via ORAL
  Filled 2023-09-18 (×3): qty 1

## 2023-09-18 MED ORDER — SERTRALINE HCL 100 MG PO TABS
200.0000 mg | ORAL_TABLET | Freq: Every day | ORAL | Status: DC
  Administered 2023-09-18 – 2023-09-20 (×3): 200 mg via ORAL
  Filled 2023-09-18 (×3): qty 2

## 2023-09-18 MED ORDER — ROCURONIUM BROMIDE 10 MG/ML (PF) SYRINGE
PREFILLED_SYRINGE | INTRAVENOUS | Status: AC
  Filled 2023-09-18: qty 10

## 2023-09-18 MED ORDER — METFORMIN HCL ER 500 MG PO TB24
1000.0000 mg | ORAL_TABLET | Freq: Every day | ORAL | Status: DC
  Administered 2023-09-18 – 2023-09-20 (×3): 1000 mg via ORAL
  Filled 2023-09-18 (×4): qty 2

## 2023-09-18 MED ORDER — DIPHENHYDRAMINE HCL 50 MG/ML IJ SOLN: 12.5000 mg | Freq: Four times a day (QID) | INTRAMUSCULAR | Status: DC | PRN

## 2023-09-18 MED ORDER — ZOLPIDEM TARTRATE 5 MG PO TABS: 5.0000 mg | ORAL_TABLET | Freq: Every evening | ORAL | Status: DC | PRN

## 2023-09-18 MED ORDER — VITAMIN C 500 MG PO TABS
250.0000 mg | ORAL_TABLET | Freq: Every day | ORAL | Status: DC
  Administered 2023-09-18 – 2023-09-20 (×3): 250 mg via ORAL
  Filled 2023-09-18 (×3): qty 1

## 2023-09-18 MED ORDER — LIDOCAINE HCL 1 % IJ SOLN
INTRAMUSCULAR | Status: AC
  Filled 2023-09-18: qty 20

## 2023-09-18 MED ORDER — MIDAZOLAM HCL 2 MG/2ML IJ SOLN
INTRAMUSCULAR | Status: AC | PRN
Start: 2023-09-18 — End: 2023-09-18
  Administered 2023-09-18 (×2): 1 mg via INTRAVENOUS

## 2023-09-18 MED ORDER — MIDAZOLAM HCL 2 MG/2ML IJ SOLN
INTRAMUSCULAR | Status: AC
  Filled 2023-09-18: qty 2

## 2023-09-18 MED ORDER — ORAL CARE MOUTH RINSE: 15.0000 mL | Freq: Once | OROMUCOSAL | Status: AC

## 2023-09-18 MED ORDER — PANTOPRAZOLE SODIUM 40 MG PO TBEC
80.0000 mg | DELAYED_RELEASE_TABLET | Freq: Every day | ORAL | Status: DC
  Administered 2023-09-18 – 2023-09-21 (×4): 80 mg via ORAL
  Filled 2023-09-18 (×4): qty 2

## 2023-09-18 MED ORDER — CHLORHEXIDINE GLUCONATE 0.12 % MT SOLN
15.0000 mL | Freq: Once | OROMUCOSAL | Status: AC
  Administered 2023-09-18: 15 mL via OROMUCOSAL
  Filled 2023-09-18: qty 15

## 2023-09-18 NOTE — H&P (Signed)
CC/HPI: 08/2018 HPI: Andre Clements is a 52 year old male with a history of paraplegia caused from a fall from 3 stories during a suicide attempt several years ago. He has a neurogenic bladder secondary to neurologic injuries from his fall. He was seen in consultation on 08/25/2018 for a 6 cm bladder stone and urinary retention and had a subsequent cystolitholapaxy on 08/26/2018. He had a Foley catheter placed at that time and is here today for catheter removal. He states that his suprapubic discomfort has markedly improved following his procedure and denies interval episodes of hematuria, suprapubic pain, nausea/vomiting or fever/chills.   06/28/2023: Patient with above-noted history, last seen in September 2019 where he underwent catheter removal following cystolitholopaxy. At that time he was encouraged to perform CIC a couple of times a day to help protect the upper tracts and prevent reformation of any bladder calculi. he has not followed up since then. He presented to the ED in early June and was diagnosed with an obstructing right ureteral stone requiring urgent stent placement with Dr. Laverle Patter. On imaging, He also has a left sided staghorn stone with signs of left renal atrophy. Also with acute kidney injury and concurrent UTI which was appropriately treated. He was then taken for definitive ureteroscopy to treat the right distal stone with Dr. Liliane Shi on 7/3. The obstructing stone was appropriately treated and a ureteral stent was left/replaced at the conclusion of the procedure. Now back today for follow-up exam with cystoscopy and stent removal. He is currently completing a course of cephalexin prescribed postoperatively as prophylaxis against today's procedure.   Today and for the past several years he has managed his voiding symptoms with use of a condom catheter. He has not been performing any type of self-catheterization. He describes stable urinary frequency without significant urgency, dysuria or gross  hematuria even with the indwelling ureteral stent. He denies any pain or discomfort unilaterally suggestive of obstructive uropathy. He has had no interval fevers or chills, nausea/vomiting since his procedure.   08/02/2023  Patient's status post right ureteroscopy with laser lithotripsy and ureteral stent placement. Stent has been removed. He has incomplete paraplegia. Unable to walk. Uses wheelchair. Has a large left staghorn calculus and comes in to discuss options. Underwent a Lasix renogram that revealed differential renal function of 43% on the left, 57% on the right with asymmetric and decreased perfusion and cortical uptake about the left kidney.   09/18/2023 Patient presents today for a left PCNL.    ALLERGIES: No Allergies    MEDICATIONS: Finasteride 5 mg tablet  Lisinopril  Omeprazole  Simvastatin  Benztropine Mesylate 0.5 mg tablet  Cholecalciferol  Farxiga  Ferrous Sulfate  Haloperidol  Metformin Er Gastric 500 mg tablet, er gastric retention 24 hr  Sertraline Hcl 100 mg tablet     GU PSH: Cysto Bladder Stone >2.5cm - 2019 Cysto Remove Stent FB Sim - 06/28/2023     NON-GU PSH: Anesth, Lwr Arm Artery Surg, broken arm Hernia Repair     GU PMH: Renal atrophy - 06/28/2023 Renal calculus - 06/28/2023 Ureteral calculus - 06/28/2023 Areflexic bladder - 2019 Bladder Stone - 2019    NON-GU PMH: Paraplegia, unspecified - 2019 Anxiety Depression GERD Hypercholesterolemia Hypertension    FAMILY HISTORY: Death In The Family Father - Father Death In The Family Mother - Mother Heart Disease - Mother Sickle Cell Hb-[place] Disease - Runs in Family   SOCIAL HISTORY: Marital Status: Single Preferred Language: English; Ethnicity: Not Hispanic Or Latino; Race: Black  or African American Current Smoking Status: Patient has never smoked.   Tobacco Use Assessment Completed: Used Tobacco in last 30 days? Does not use drugs. Drinks 2 caffeinated drinks per day.    REVIEW OF  SYSTEMS:    GU Review Male:   Patient denies frequent urination, hard to postpone urination, burning/ pain with urination, get up at night to urinate, leakage of urine, stream starts and stops, trouble starting your stream, have to strain to urinate , erection problems, and penile pain.  Gastrointestinal (Upper):   Patient denies nausea, vomiting, and indigestion/ heartburn.  Gastrointestinal (Lower):   Patient denies diarrhea and constipation.  Constitutional:   Patient denies fever, night sweats, weight loss, and fatigue.  Skin:   Patient denies itching and skin rash/ lesion.  Eyes:   Patient denies blurred vision and double vision.  Ears/ Nose/ Throat:   Patient denies sore throat and sinus problems.  Hematologic/Lymphatic:   Patient denies swollen glands and easy bruising.  Cardiovascular:   Patient denies leg swelling and chest pains.  Respiratory:   Patient denies cough and shortness of breath.  Endocrine:   Patient denies excessive thirst.  Musculoskeletal:   Patient denies back pain and joint pain.  Neurological:   Patient denies headaches and dizziness.  Psychologic:   Patient denies depression and anxiety.   BP 138/76   Pulse (!) 58   Temp 98.5 F (36.9 C) (Oral)   Resp 18   SpO2 98%    MULTI-SYSTEM PHYSICAL EXAMINATION:    Constitutional: Well-nourished. No physical deformities. Normally developed. Good grooming.  Abdomen soft, nontender, nondistended     ASSESSMENT:      ICD-10 Details  1 GU:   Renal calculus - N20.0 Chronic, Stable  2   Renal atrophy - N27.0 Chronic, Stable   PLAN:    for PCNL I described the risks including heart attack, pulmonary embolus, death, positioning injury, pneumothorax, hydrothorax, need for chest tube, inability to clear stone burden, renal laceration, arterial venous fistula or malformation, need for embolization of kidney, loss of kidney or renal function, need for repeat procedure, need for prolonged nephrostomy tube, ureteral  avulsion.   Would like to proceed with PCNL.

## 2023-09-18 NOTE — Progress Notes (Addendum)
   09/18/23 2021  Vitals  Temp (!) 100.7 F (38.2 C)  Temp Source Oral  BP 120/76  MAP (mmHg) 90  BP Location Right Arm  BP Method Automatic  Patient Position (if appropriate) Lying  Pulse Rate (!) 135  Pulse Rate Source Monitor  Resp 17  MEWS COLOR  MEWS Score Color Red  Oxygen Therapy  SpO2 98 %  O2 Device Room Air  MEWS Score  MEWS Temp 1  MEWS Systolic 0  MEWS Pulse 3  MEWS RR 0  MEWS LOC 0  MEWS Score 4   -[facility] called and PRN Tylenol given -Temp recheck 30 mins after Tylenol at 2020 was 99.6 orally -Bladder scan and EKG done at 2020-- bladder scan showed 743 cc -Cathren Harsh, MD placed order for foley catheter to be placed due to urinary retention

## 2023-09-18 NOTE — Plan of Care (Signed)
  Problem: Education: Goal: Knowledge of General Education information will improve Description Including pain rating scale, medication(s)/side effects and non-pharmacologic comfort measures Outcome: Progressing   

## 2023-09-18 NOTE — OR Nursing (Signed)
Patient does not have transportation home today, scheduled for city transport until tomorrow. No family available per patient including aunt on file to come get patient. Pt to be admitted for observation per Dr. Alvester Morin d/t post-sedation.

## 2023-09-19 DIAGNOSIS — N200 Calculus of kidney: Secondary | ICD-10-CM | POA: Diagnosis not present

## 2023-09-19 LAB — CBC
HCT: 30 % — ABNORMAL LOW (ref 39.0–52.0)
HCT: 31.3 % — ABNORMAL LOW (ref 39.0–52.0)
Hemoglobin: 9.2 g/dL — ABNORMAL LOW (ref 13.0–17.0)
Hemoglobin: 9.4 g/dL — ABNORMAL LOW (ref 13.0–17.0)
MCH: 20.2 pg — ABNORMAL LOW (ref 26.0–34.0)
MCH: 20.2 pg — ABNORMAL LOW (ref 26.0–34.0)
MCHC: 30 g/dL (ref 30.0–36.0)
MCHC: 30.7 g/dL (ref 30.0–36.0)
MCV: 65.8 fL — ABNORMAL LOW (ref 80.0–100.0)
MCV: 67.3 fL — ABNORMAL LOW (ref 80.0–100.0)
Platelets: 273 10*3/uL (ref 150–400)
Platelets: 309 10*3/uL (ref 150–400)
RBC: 4.56 MIL/uL (ref 4.22–5.81)
RBC: 4.65 MIL/uL (ref 4.22–5.81)
RDW: 18.7 % — ABNORMAL HIGH (ref 11.5–15.5)
RDW: 18.9 % — ABNORMAL HIGH (ref 11.5–15.5)
WBC: 11.7 10*3/uL — ABNORMAL HIGH (ref 4.0–10.5)
WBC: 14.6 10*3/uL — ABNORMAL HIGH (ref 4.0–10.5)
nRBC: 0 % (ref 0.0–0.2)
nRBC: 0 % (ref 0.0–0.2)

## 2023-09-19 MED ORDER — SODIUM CHLORIDE 0.9 % IV BOLUS
1000.0000 mL | Freq: Once | INTRAVENOUS | Status: AC
  Administered 2023-09-19: 1000 mL via INTRAVENOUS

## 2023-09-19 MED ORDER — SODIUM CHLORIDE 0.9 % IV SOLN
2.0000 g | INTRAVENOUS | Status: DC
  Administered 2023-09-19: 2 g via INTRAVENOUS
  Filled 2023-09-19: qty 20

## 2023-09-19 MED ORDER — SULFAMETHOXAZOLE-TRIMETHOPRIM 800-160 MG PO TABS
1.0000 | ORAL_TABLET | Freq: Two times a day (BID) | ORAL | Status: DC
  Administered 2023-09-19 – 2023-09-21 (×4): 1 via ORAL
  Filled 2023-09-19 (×4): qty 1

## 2023-09-19 MED ORDER — CHLORHEXIDINE GLUCONATE CLOTH 2 % EX PADS
6.0000 | MEDICATED_PAD | Freq: Every day | CUTANEOUS | Status: DC
  Administered 2023-09-19: 6 via TOPICAL

## 2023-09-19 MED ORDER — SULFAMETHOXAZOLE-TRIMETHOPRIM 800-160 MG PO TABS: 1.0000 | ORAL_TABLET | Freq: Two times a day (BID) | ORAL | 0 refills | Status: AC

## 2023-09-19 NOTE — Progress Notes (Addendum)
Urology Inpatient Progress Report  Renal calculi [N20.0]  Procedure(s): LEFT NEPHROLITHOTOMY PERCUTANEOUS  1 Day Post-Op   Intv/Subj: Patient febrile o/n with tachycardia, improving. On ceftriaxone. Leukocytosis improving after initial spike. He states he is feeling better. Foley placed for retention  Principal Problem:   Renal calculi  Current Facility-Administered Medications  Medication Dose Route Frequency Provider Last Rate Last Admin   acetaminophen (TYLENOL) tablet 650 mg  650 mg Oral Q4H PRN Ray Church III, MD   650 mg at 09/19/23 0944   ascorbic acid (VITAMIN C) tablet 250 mg  250 mg Oral q1800 Ray Church III, MD   250 mg at 09/18/23 1734   benztropine (COGENTIN) tablet 0.5 mg  0.5 mg Oral q1800 Ray Church III, MD   0.5 mg at 09/18/23 1734   cefTRIAXone (ROCEPHIN) 2 g in sodium chloride 0.9 % 100 mL IVPB  2 g Intravenous Q24H Cathren Harsh, MD 200 mL/hr at 09/19/23 0953 2 g at 09/19/23 6387   Chlorhexidine Gluconate Cloth 2 % PADS 6 each  6 each Topical Daily Ray Church III, MD       cholecalciferol (VITAMIN D3) 25 MCG (1000 UNIT) tablet 1,000 Units  1,000 Units Oral [id number] Ray Church III, MD   1,000 Units at 09/18/23 1734   dapagliflozin propanediol (FARXIGA) tablet 5 mg  5 mg Oral q1800 Ray Church III, MD   5 mg at 09/18/23 1734   diphenhydrAMINE (BENADRYL) injection 12.5-25 mg  12.5-25 mg Intravenous Q6H PRN Ray Church III, MD       Or   diphenhydrAMINE (BENADRYL) 12.5 MG/5ML elixir 12.5-25 mg  12.5-25 mg Oral Q6H PRN Ray Church III, MD       docusate sodium (COLACE) capsule 100 mg  100 mg Oral BID Ray Church III, MD   100 mg at 09/19/23 0944   ferrous sulfate tablet 325 mg  325 mg Oral q1800 Ray Church III, MD   325 mg at 09/18/23 1734   finasteride (PROSCAR) tablet 5 mg  5 mg Oral q1800 Ray Church III, MD   5 mg at 09/18/23 1734   haloperidol (HALDOL) tablet 5 mg  5 mg Oral q1800 Ray Church III, MD   5 mg at 09/18/23 1734    hydrochlorothiazide (HYDRODIURIL) tablet 12.5 mg  12.5 mg Oral q1800 Ray Church III, MD   12.5 mg at 09/18/23 1734   HYDROmorphone (DILAUDID) injection 0.5-1 mg  0.5-1 mg Intravenous Q2H PRN Ray Church III, MD       lidocaine (XYLOCAINE) 2 % jelly 1 Application  1 Application Urethral Once Cathren Harsh, MD       lisinopril (ZESTRIL) tablet 40 mg  40 mg Oral q1800 Ray Church III, MD   40 mg at 09/18/23 1734   metFORMIN (GLUCOPHAGE-XR) 24 hr tablet 1,000 mg  1,000 mg Oral q1800 Ray Church III, MD   1,000 mg at 09/18/23 1734   ondansetron (ZOFRAN) injection 4 mg  4 mg Intravenous Q4H PRN Ray Church III, MD       oxyCODONE (Oxy IR/ROXICODONE) immediate release tablet 5 mg  5 mg Oral Q4H PRN Ray Church III, MD       pantoprazole (PROTONIX) EC tablet 80 mg  80 mg Oral Daily Ray Church III, MD   80 mg at 09/19/23 0944   sertraline (ZOLOFT) tablet 200 mg  200 mg Oral q1800 Ray Church  III, MD   200 mg at 09/18/23 1734   simvastatin (ZOCOR) tablet 40 mg  40 mg Oral q1800 Ray Church III, MD   40 mg at 09/18/23 1734   zolpidem (AMBIEN) tablet 5 mg  5 mg Oral QHS PRN,MR X 1 Ray Church III, MD         Objective: Vital: Vitals:   09/18/23 2310 09/18/23 2357 09/19/23 0442 09/19/23 0854  BP: (!) 100/53 (!) 104/56 121/64 118/66  Pulse: (!) 121 (!) 123 (!) 110 (!) 106  Resp:  18 18 18   Temp: 99.9 F (37.7 C) 98.9 F (37.2 C) 99.7 F (37.6 C) 100.3 F (37.9 C)  TempSrc: Oral Oral Oral Oral  SpO2: 99% 97% 99% 99%  Weight:      Height:       I/Os: I/O last 3 completed shifts: In: 1120.2 [P.O.:960; I.V.:60.2; IV Piggyback:100] Out: 1600 [Urine:1600]  Physical Exam:  General: Patient is in no apparent distress Lungs: Normal respiratory effort, chest expands symmetrically. GI: The abdomen is soft and nontender without mass. JP drain with serosanguinous drainage Foley: clear yellow urine Ext: lower extremities symmetric  Lab Results: Recent Labs     09/18/23 0820 09/19/23 0012 09/19/23 0510  WBC 7.9 14.6* 11.7*  HGB 10.0* 9.4* 9.2*  HCT 34.2* 31.3* 30.0*   Recent Labs    09/18/23 0820  NA 139  K 3.6  CL 103  CO2 25  GLUCOSE 101*  BUN 28*  CREATININE 1.38*  CALCIUM 9.4   Recent Labs    09/18/23 0820  INR 1.0   No results for input(s): "LABURIN" in the last 72 hours. Results for orders placed or performed during the hospital encounter of 05/24/23  Urine Culture     Status: Abnormal   Collection Time: 05/24/23  5:03 PM   Specimen: Urine, Random  Result Value Ref Range Status   Specimen Description URINE, RANDOM  Final   Special Requests   Final    NONE Reflexed from [id number] Performed at [facility], [address], [place], [place] [address]    Culture (A)  Final    80,000 COLONIES/mL CITROBACTER FREUNDII 30,000 COLONIES/mL PROTEUS MIRABILIS    Report Status 05/27/2023 FINAL  Final   Organism ID, Bacteria CITROBACTER FREUNDII (A)  Final   Organism ID, Bacteria PROTEUS MIRABILIS (A)  Final      Susceptibility   Citrobacter freundii - MIC*    CEFEPIME <=0.12 SENSITIVE Sensitive     CEFTRIAXONE <=0.25 SENSITIVE Sensitive     CIPROFLOXACIN <=0.25 SENSITIVE Sensitive     GENTAMICIN <=1 SENSITIVE Sensitive     IMIPENEM 1 SENSITIVE Sensitive     NITROFURANTOIN <=16 SENSITIVE Sensitive     TRIMETH/SULFA <=20 SENSITIVE Sensitive     PIP/TAZO <=4 SENSITIVE Sensitive     * 80,000 COLONIES/mL CITROBACTER FREUNDII   Proteus mirabilis - MIC*    AMPICILLIN <=2 SENSITIVE Sensitive     CEFAZOLIN 8 SENSITIVE Sensitive     CEFEPIME <=0.12 SENSITIVE Sensitive     CEFTRIAXONE <=0.25 SENSITIVE Sensitive     CIPROFLOXACIN <=0.25 SENSITIVE Sensitive     GENTAMICIN <=1 SENSITIVE Sensitive     IMIPENEM 8 INTERMEDIATE Intermediate     NITROFURANTOIN 128 RESISTANT Resistant     TRIMETH/SULFA <=20 SENSITIVE Sensitive     AMPICILLIN/SULBACTAM <=2 SENSITIVE Sensitive     PIP/TAZO <=4 SENSITIVE Sensitive     * 30,000  COLONIES/mL PROTEUS MIRABILIS  Culture, blood (Routine x 2)  Status: None   Collection Time: 05/24/23  5:40 PM   Specimen: BLOOD  Result Value Ref Range Status   Specimen Description BLOOD SITE NOT SPECIFIED  Final   Special Requests   Final    BOTTLES DRAWN AEROBIC AND ANAEROBIC Blood Culture results may not be optimal due to an inadequate volume of blood received in culture bottles   Culture   Final    NO GROWTH 5 DAYS Performed at [facility], [address], [place], [place] [address]    Report Status 05/29/2023 FINAL  Final  Culture, blood (Routine x 2)     Status: None   Collection Time: 05/24/23  7:01 PM   Specimen: BLOOD  Result Value Ref Range Status   Specimen Description BLOOD SITE NOT SPECIFIED  Final   Special Requests   Final    BOTTLES DRAWN AEROBIC AND ANAEROBIC Blood Culture results may not be optimal due to an inadequate volume of blood received in culture bottles   Culture   Final    NO GROWTH 5 DAYS Performed at [facility], [address], [place], [place] [address]    Report Status 05/29/2023 FINAL  Final  MRSA Next Gen by PCR, Nasal     Status: None   Collection Time: 05/25/23  1:27 AM   Specimen: Nasal Mucosa; Nasal Swab  Result Value Ref Range Status   MRSA by PCR Next Gen NOT DETECTED NOT DETECTED Final    Comment: (NOTE) The GeneXpert MRSA Assay (FDA approved for NASAL specimens only), is one component of a comprehensive MRSA colonization surveillance program. It is not intended to diagnose MRSA infection nor to guide or monitor treatment for MRSA infections. Test performance is not FDA approved in patients less than 71 years old. Performed at [facility], [address], [place], [place] [address]     Studies/Results: IR URETERAL STENT LEFT NEW ACCESS W/O SEP NEPHROSTOMY CATH  Result Date: 09/18/2023 CLINICAL DATA:  Staghorn calculus of the left kidney. Left percutaneous renal access and planned ureteral  catheter placement prior to operative nephrolithotomy today. EXAM: 1. ULTRASOUND GUIDANCE FOR PUNCTURE OF THE LEFT RENAL COLLECTING SYSTEM 2. ABORTED ATTEMPTED LEFT URETERAL CATHETER PLACEMENT AFTER PERCUTANEOUS ACCESS OF THE LEFT RENAL COLLECTING SYSTEM UNDER ULTRASOUND AND FLUOROSCOPIC GUIDANCE COMPARISON:  None Available. ANESTHESIA/SEDATION: Moderate (conscious) sedation was employed during this procedure. A total of Versed 2.0 mg and Fentanyl 100 mcg was administered intravenously by radiology nursing. Moderate Sedation Time: 55 minutes. The patient's level of consciousness and vital signs were monitored continuously by radiology nursing throughout the procedure under my direct supervision. CONTRAST:  60 ml Omnipaque 300 MEDICATIONS: 2 g IV Rocephin. Antibiotic was administered in an appropriate time frame prior to skin puncture. FLUOROSCOPY TIME:  25 minutes and 30 seconds.  902 mGy. PROCEDURE: The procedure, risks, benefits, and alternatives were explained to the patient. Questions regarding the procedure were encouraged and answered. The patient understands and consents to the procedure. A time-out was performed prior to initiating the procedure. The left flank region was prepped with chlorhexidine in a sterile fashion, and a sterile drape was applied covering the operative field. A sterile gown and sterile gloves were used for the procedure. Local anesthesia was provided with 1% Lidocaine. Ultrasound was used to localize the left kidney. Under direct ultrasound guidance, a 21 gauge needle was advanced into the renal collecting system. Ultrasound image documentation was performed. Aspiration of urine sample was performed followed by contrast injection.  Fluoroscopic imaging of the collecting system was performed with opacification via upper pole access. Separate puncture of an opacified posterior lower pole calyx was then performed under fluoroscopic guidance with a new 21 gauge Chiba needle. A 0.018 inch  guidewire was advanced through the needle and advanced under fluoroscopy. A transitional dilator was advanced into the lower pole calyx. A hydrophilic guidewire was also advanced into the lower pole calyx. Additional contrast injection was performed through the transitional dilator. The transitional dilator was then removed. An interpolar calyx was then punctured under fluoroscopic guidance with a 22 gauge needle. After puncture, contrast was injected. A transitional dilator was placed over a guidewire. A 5 French catheter was then advanced into the calyx and different hydrophilic guidewires advanced under fluoroscopy. Contrast injection was periodically performed through the 5 French catheter. Ultimately, this catheter was removed. A dressing was applied over the puncture sites at the level of the left flank. COMPLICATIONS: None. FINDINGS: Fluoroscopy demonstrates an extensive staghorn calculus throughout the collecting system and renal pelvis of the left kidney. By ultrasound, there is some relative dilatation of the upper pole collecting system which was able to be punctured under ultrasound guidance allowing contrast opacification of the collecting system. This demonstrates dilated upper pole calices due to the staghorn calculus but there is some flow of contrast into other aspects of the collecting system which allowed initial puncture of a lower pole calyx under fluoroscopic guidance. After access of the lower pole calyx, a guidewire would persistently meet obstruction at the level of a lower pole infundibulum and would not pass into the renal pelvis. With manipulation utilizing a transitional dilator and multiple guidewires, eventually extravasation of contrast was present related to caliceal injury and creation false passage. Access was therefore tried from an interpolar calyx. After successful fluoroscopic puncture of an interpolar calyx, there was relative obstruction met at the level of the renal pelvis  and central collecting system from the staghorn calculus and access into the distal pelvis and ureter could not be accomplished with various guidewires. Again, false passage and contrast extravasation was present with catheter manipulation and access had to be aborted. New upper pole access is not favorable for percutaneous nephrolithotomy given angulation present as well as its intracostal location. Findings from the procedure were discussed with Dr. Alvester Morin over the phone. IMPRESSION: Percutaneous access of the left kidney was abandoned as access to the renal pelvis and ureter could not be accomplished from both lower pole and interpolar access as detailed above due to the extensive nature the staghorn calculus. Electronically Signed   By: Irish Lack M.D.   On: 09/18/2023 14:41    Assessment: Left renal calculi UTI  Procedure(s): LEFT NEPHROLITHOTOMY PERCUTANEOUS, 1 Day Post-Op  doing well.  Plan: IR unable to gain access with NUS yesterday. Will refer to [facility] for PCNL. Febrile overnight. Will keep another night and switch to bactrim based on prior cultures. Will send Ucx but likely to be negative given doses of abx given.   Modena Slater, MD Urology 09/19/2023, 10:43 AM

## 2023-09-19 NOTE — Care Management Obs Status (Signed)
MEDICARE OBSERVATION STATUS NOTIFICATION   Patient Details  Name: Andre Clements MRN: [id number] Date of Birth: 1971-04-20   Medicare Observation Status Notification Given:       Howell Rucks, RN 09/19/2023, 10:46 AM

## 2023-09-19 NOTE — TOC CM/SW Note (Signed)
Transition of Care [facility]) - Inpatient Brief Assessment   Patient Details  Name: Andre Clements MRN: [id number] Date of Birth: 02-18-1971  [facility]) CM/SW Contact:    Howell Rucks, RN Phone Number: 09/19/2023, 10:47 AM   Clinical Narrative: Met with pt at bedside to introduce role of TOC/NCM and review for dc planning. Pt reports he has an establishded PCP and pharmacy, pt reports he lives alone, has support from his Aunt who lives nearby. Pt has hx of paraplegia and is wheelchair bound. Pt reports he uses [employer] for transportation. Pt reports he may need a procedure at [facility] in [place], [place], requesting resources for transportation. NCM will research for transportation resources.  No further TOC needs identified. MOON completed.     Transition of Care Asessment: Insurance and Status: Insurance coverage has been reviewed Patient has primary care physician: Yes Home environment has been reviewed: resides in an apartment alone with support from his Aunt Prior level of function:: Wheelchair bound Prior/Current Home Services: No current home services Social Determinants of Health Reivew: SDOH reviewed no interventions necessary Readmission risk has been reviewed: Yes Transition of care needs: no transition of care needs at this time

## 2023-09-20 DIAGNOSIS — N200 Calculus of kidney: Secondary | ICD-10-CM | POA: Diagnosis not present

## 2023-09-20 LAB — URINE CULTURE: Culture: NO GROWTH

## 2023-09-20 LAB — GLUCOSE, CAPILLARY: Glucose-Capillary: 96 mg/dL (ref 70–99)

## 2023-09-21 DIAGNOSIS — N200 Calculus of kidney: Secondary | ICD-10-CM | POA: Diagnosis not present

## 2023-09-21 MED ORDER — INFLUENZA VIRUS VACC SPLIT PF (FLUZONE) 0.5 ML IM SUSY: 0.5000 mL | PREFILLED_SYRINGE | INTRAMUSCULAR | Status: DC

## 2023-09-21 MED ORDER — INFLUENZA VIRUS VACC SPLIT PF (FLUZONE) 0.5 ML IM SUSY: 0.5000 mL | PREFILLED_SYRINGE | INTRAMUSCULAR | 0 refills | Status: AC

## 2023-09-21 NOTE — Plan of Care (Signed)
  Problem: Coping: Goal: Level of anxiety will decrease Outcome: Progressing   Problem: Safety: Goal: Ability to remain free from injury will improve Outcome: Progressing   Problem: Skin Integrity: Goal: Risk for impaired skin integrity will decrease Outcome: Progressing   

## 2023-09-21 NOTE — Progress Notes (Signed)
Patient to be discharged to home today. All discharge teaching including all Discharge Medications and schedules for these Medications reviewed with the Patient. Understanding verbalized and discharge AVS with the Patient at discharge

## 2023-09-21 NOTE — TOC Transition Note (Signed)
Transition of Care [facility]) - CM/SW Discharge Note   Patient Details  Name: Andre Clements MRN: [id number] Date of Birth: 1971-03-28  Transition of [facility]) CM/SW Contact:  Howell Rucks, RN Phone Number: 09/21/2023, 12:58 PM   Clinical Narrative:   DC to home order today, [employer] for wheelchair transport called. No further TOC needs identified.    Final next level of care: Home/Self Care Barriers to Discharge: Barriers Resolved   Patient Goals and CMS Choice      Discharge Placement                         Discharge Plan and Services Additional resources added to the After Visit Summary for                                       Social Determinants of Health (SDOH) Interventions SDOH Screenings   Food Insecurity: No Food Insecurity (09/18/2023)  Housing: Low Risk  (09/18/2023)  Transportation Needs: No Transportation Needs (09/18/2023)  Utilities: Not At Risk (09/18/2023)  Alcohol Screen: Low Risk  (10/28/2020)  Depression (PHQ2-9): Low Risk  (04/27/2021)  Financial Resource Strain: Low Risk  (10/28/2020)  Physical Activity: Inactive (10/28/2020)  Social Connections: Unknown (10/28/2020)  Stress: No Stress Concern Present (10/28/2020)  Tobacco Use: Low Risk  (09/18/2023)     Readmission Risk Interventions     No data to display

## 2023-09-21 NOTE — Discharge Summary (Signed)
Patient ID: Andre Clements MRN: [id number] DOB/AGE: Jan 01, 1971 52 y.o.  Admit date: 09/18/2023 Discharge date: 09/21/2023  Primary Care Physician:  Estevan Oaks, NP  Discharge Diagnoses:   Nephrolithiasis   Discharge Medications: Allergies as of 09/21/2023       Reactions   Other Other (See Comments)   Turnip greens        Medication List     TAKE these medications    benztropine 0.5 MG tablet Commonly known as: COGENTIN Take 0.5 mg by mouth daily at 6 PM.   CENTRUM VITAMINTS PO Take 1 tablet by mouth daily.   cholecalciferol 25 MCG (1000 UNIT) tablet Commonly known as: VITAMIN D3 Take 1,000 Units by mouth daily at 6 PM.   dapagliflozin propanediol 5 MG Tabs tablet Commonly known as: FARXIGA Take 5 mg by mouth daily at 6 PM.   ferrous sulfate 325 (65 FE) MG tablet Take 325 mg by mouth daily at 6 PM.   finasteride 5 MG tablet Commonly known as: PROSCAR Take 1 tablet (5 mg total) by mouth daily. What changed: when to take this   haloperidol 5 MG tablet Commonly known as: HALDOL Take 1 tablet (5 mg total) by mouth daily. What changed: when to take this   hydrochlorothiazide 12.5 MG tablet Commonly known as: HYDRODIURIL Take 12.5 mg by mouth daily at 6 PM.   influenza vac split trivalent PF 0.5 ML injection Commonly known as: FLULAVAL Inject 0.5 mLs into the muscle tomorrow at 10 am for 1 dose. Start taking on: September 22, 2023   lisinopril 40 MG tablet Commonly known as: ZESTRIL Take 40 mg by mouth daily at 6 PM.   metFORMIN 500 MG 24 hr tablet Commonly known as: GLUCOPHAGE-XR Take 1 tablet (500 mg total) by mouth daily with breakfast. What changed:  how much to take when to take this   omeprazole 40 MG capsule Commonly known as: PRILOSEC Take 1 capsule (40 mg total) by mouth daily. What changed: when to take this   sertraline 100 MG tablet Commonly known as: ZOLOFT Take 2 tablets (200 mg total) by mouth daily. What changed: when to  take this   simvastatin 40 MG tablet Commonly known as: ZOCOR Take 1 tablet (40 mg total) by mouth daily at 6 PM.   sulfamethoxazole-trimethoprim 800-160 MG tablet Commonly known as: BACTRIM DS Take 1 tablet by mouth every 12 (twelve) hours.   Vitamin C 250 MG Chew Chew 250 each by mouth daily at 6 PM.               Discharge Care Instructions  (From admission, onward)           Start     Ordered   09/21/23 0000  Discharge wound care:       Comments: Nursing to remove gauze at nephrostomy site and replace with Tegaderm.  Remove in 48 hours.   09/21/23 0949             Significant Diagnostic Studies:  IR URETERAL STENT LEFT NEW ACCESS W/O SEP NEPHROSTOMY CATH  Result Date: 09/18/2023 CLINICAL DATA:  Staghorn calculus of the left kidney. Left percutaneous renal access and planned ureteral catheter placement prior to operative nephrolithotomy today. EXAM: 1. ULTRASOUND GUIDANCE FOR PUNCTURE OF THE LEFT RENAL COLLECTING SYSTEM 2. ABORTED ATTEMPTED LEFT URETERAL CATHETER PLACEMENT AFTER PERCUTANEOUS ACCESS OF THE LEFT RENAL COLLECTING SYSTEM UNDER ULTRASOUND AND FLUOROSCOPIC GUIDANCE COMPARISON:  None Available. ANESTHESIA/SEDATION: Moderate (conscious) sedation was employed during this  procedure. A total of Versed 2.0 mg and Fentanyl 100 mcg was administered intravenously by radiology nursing. Moderate Sedation Time: 55 minutes. The patient's level of consciousness and vital signs were monitored continuously by radiology nursing throughout the procedure under my direct supervision. CONTRAST:  60 ml Omnipaque 300 MEDICATIONS: 2 g IV Rocephin. Antibiotic was administered in an appropriate time frame prior to skin puncture. FLUOROSCOPY TIME:  25 minutes and 30 seconds.  902 mGy. PROCEDURE: The procedure, risks, benefits, and alternatives were explained to the patient. Questions regarding the procedure were encouraged and answered. The patient understands and consents to the  procedure. A time-out was performed prior to initiating the procedure. The left flank region was prepped with chlorhexidine in a sterile fashion, and a sterile drape was applied covering the operative field. A sterile gown and sterile gloves were used for the procedure. Local anesthesia was provided with 1% Lidocaine. Ultrasound was used to localize the left kidney. Under direct ultrasound guidance, a 21 gauge needle was advanced into the renal collecting system. Ultrasound image documentation was performed. Aspiration of urine sample was performed followed by contrast injection. Fluoroscopic imaging of the collecting system was performed with opacification via upper pole access. Separate puncture of an opacified posterior lower pole calyx was then performed under fluoroscopic guidance with a new 21 gauge Chiba needle. A 0.018 inch guidewire was advanced through the needle and advanced under fluoroscopy. A transitional dilator was advanced into the lower pole calyx. A hydrophilic guidewire was also advanced into the lower pole calyx. Additional contrast injection was performed through the transitional dilator. The transitional dilator was then removed. An interpolar calyx was then punctured under fluoroscopic guidance with a 22 gauge needle. After puncture, contrast was injected. A transitional dilator was placed over a guidewire. A 5 French catheter was then advanced into the calyx and different hydrophilic guidewires advanced under fluoroscopy. Contrast injection was periodically performed through the 5 French catheter. Ultimately, this catheter was removed. A dressing was applied over the puncture sites at the level of the left flank. COMPLICATIONS: None. FINDINGS: Fluoroscopy demonstrates an extensive staghorn calculus throughout the collecting system and renal pelvis of the left kidney. By ultrasound, there is some relative dilatation of the upper pole collecting system which was able to be punctured under  ultrasound guidance allowing contrast opacification of the collecting system. This demonstrates dilated upper pole calices due to the staghorn calculus but there is some flow of contrast into other aspects of the collecting system which allowed initial puncture of a lower pole calyx under fluoroscopic guidance. After access of the lower pole calyx, a guidewire would persistently meet obstruction at the level of a lower pole infundibulum and would not pass into the renal pelvis. With manipulation utilizing a transitional dilator and multiple guidewires, eventually extravasation of contrast was present related to caliceal injury and creation false passage. Access was therefore tried from an interpolar calyx. After successful fluoroscopic puncture of an interpolar calyx, there was relative obstruction met at the level of the renal pelvis and central collecting system from the staghorn calculus and access into the distal pelvis and ureter could not be accomplished with various guidewires. Again, false passage and contrast extravasation was present with catheter manipulation and access had to be aborted. New upper pole access is not favorable for percutaneous nephrolithotomy given angulation present as well as its intracostal location. Findings from the procedure were discussed with Dr. Alvester Morin over the phone. IMPRESSION: Percutaneous access of the left kidney was abandoned as  access to the renal pelvis and ureter could not be accomplished from both lower pole and interpolar access as detailed above due to the extensive nature the staghorn calculus. Electronically Signed   By: Irish Lack M.D.   On: 09/18/2023 14:41    Brief H and P: For complete details please refer to admission H&P, but in brief, interventional radiology was unable to gain access for PCNL on 09/19/2023.  Patient became febrile overnight and was notably tachycardic.  He was placed on Bactrim based on prior cultures.  Urinalysis showed no growth but  was collected after empiric antibiotics had already been given.  Over the last 48 hours he has continued to improve and is no longer fevering as of this morning.  Heart rate down to 99.  He feels well and is comfortable discharging home.  We reviewed causes for concern and when to contact clinic including temperature greater than 100.4, intractable nausea and vomiting, intractable pain, or persistent significant bleeding.  Patient expressed understanding.  Hospital Course:  Principal Problem:   Renal calculi   Day of Discharge BP 119/73 (BP Location: Right Wrist)   Pulse 99   Temp 99.8 F (37.7 C) (Oral)   Resp 18   Ht 5\' 8"  (1.727 m)   Wt 99.8 kg   SpO2 100%   BMI 33.45 kg/m   No results found for this or any previous visit (from the past 24 hour(s)).  Physical Exam: General: Alert and awake oriented x3 not in any acute distress. HEENT: anicteric sclera, pupils reactive to light and accommodation CVS: S1-S2 clear no murmur rubs or gallops Chest: clear to auscultation bilaterally, no wheezing rales or rhonchi Abdomen: soft nontender, nondistended, normal bowel sounds, no organomegaly Extremities: no cyanosis, clubbing or edema noted bilaterally Neuro: Cranial nerves II-XII intact, no focal neurological deficits  Disposition: Discharge home  Diet: No dietary restrictions  Activity: Limit lifting to 10 pounds for 1 week   DISCHARGE FOLLOW-UP Patient will see Dr. Alvester Morin in clinic in approximately 1 year with renal ultrasound.  Otherwise he will follow-up with [facility] to discuss second attempt at PCNL.   Time spent on Discharge: 38 minutes on discharge, planning, and patient education.  Case and plan discussed with Dr. Alvester Morin.  SignedScherrie Bateman Laren Whaling 09/21/2023, 9:50 AM
